# Patient Record
Sex: Female | Born: 1945 | Race: White | Smoking: Never smoker
Health system: Southern US, Community
[De-identification: ages and names within clinical notes are randomized; demographics above are authoritative.]

## PROBLEM LIST (undated history)

## (undated) DIAGNOSIS — T783XXA Angioneurotic edema, initial encounter: Secondary | ICD-10-CM

## (undated) DIAGNOSIS — I4891 Unspecified atrial fibrillation: Secondary | ICD-10-CM

## (undated) DIAGNOSIS — E785 Hyperlipidemia, unspecified: Secondary | ICD-10-CM

## (undated) DIAGNOSIS — Z5320 Procedure and treatment not carried out because of patient's decision for unspecified reasons: Secondary | ICD-10-CM

## (undated) DIAGNOSIS — E042 Nontoxic multinodular goiter: Secondary | ICD-10-CM

## (undated) DIAGNOSIS — G4733 Obstructive sleep apnea (adult) (pediatric): Secondary | ICD-10-CM

## (undated) DIAGNOSIS — R011 Cardiac murmur, unspecified: Secondary | ICD-10-CM

## (undated) DIAGNOSIS — M1990 Unspecified osteoarthritis, unspecified site: Secondary | ICD-10-CM

## (undated) DIAGNOSIS — Z9989 Dependence on other enabling machines and devices: Secondary | ICD-10-CM

## (undated) DIAGNOSIS — E039 Hypothyroidism, unspecified: Secondary | ICD-10-CM

## (undated) DIAGNOSIS — R112 Nausea with vomiting, unspecified: Secondary | ICD-10-CM

## (undated) DIAGNOSIS — M8580 Other specified disorders of bone density and structure, unspecified site: Secondary | ICD-10-CM

## (undated) DIAGNOSIS — Z9889 Other specified postprocedural states: Secondary | ICD-10-CM

## (undated) DIAGNOSIS — J069 Acute upper respiratory infection, unspecified: Secondary | ICD-10-CM

## (undated) HISTORY — DX: Nontoxic multinodular goiter: E04.2

## (undated) HISTORY — DX: Unspecified atrial fibrillation: I48.91

## (undated) HISTORY — DX: Acute upper respiratory infection, unspecified: J06.9

## (undated) HISTORY — DX: Unspecified osteoarthritis, unspecified site: M19.90

## (undated) HISTORY — DX: Hyperlipidemia, unspecified: E78.5

## (undated) HISTORY — DX: Hypothyroidism, unspecified: E03.9

## (undated) HISTORY — DX: Angioneurotic edema, initial encounter: T78.3XXA

## (undated) HISTORY — PX: CHOLECYSTECTOMY: SHX55

## (undated) HISTORY — DX: Cardiac murmur, unspecified: R01.1

## (undated) HISTORY — DX: Morbid (severe) obesity due to excess calories: E66.01

## (undated) HISTORY — DX: Dependence on other enabling machines and devices: Z99.89

## (undated) HISTORY — DX: Other specified disorders of bone density and structure, unspecified site: M85.80

## (undated) HISTORY — DX: Obstructive sleep apnea (adult) (pediatric): G47.33

## (undated) HISTORY — DX: Procedure and treatment not carried out because of patient's decision for unspecified reasons: Z53.20

## (undated) HISTORY — PX: TONSILLECTOMY: SUR1361

---

## 2011-06-27 DIAGNOSIS — I4891 Unspecified atrial fibrillation: Secondary | ICD-10-CM

## 2011-06-27 HISTORY — DX: Unspecified atrial fibrillation: I48.91

## 2011-11-06 DIAGNOSIS — I4891 Unspecified atrial fibrillation: Secondary | ICD-10-CM | POA: Diagnosis not present

## 2011-11-06 DIAGNOSIS — E039 Hypothyroidism, unspecified: Secondary | ICD-10-CM | POA: Diagnosis not present

## 2011-11-06 DIAGNOSIS — G9009 Other idiopathic peripheral autonomic neuropathy: Secondary | ICD-10-CM | POA: Diagnosis not present

## 2011-11-10 DIAGNOSIS — G9009 Other idiopathic peripheral autonomic neuropathy: Secondary | ICD-10-CM | POA: Diagnosis not present

## 2011-11-10 DIAGNOSIS — E039 Hypothyroidism, unspecified: Secondary | ICD-10-CM | POA: Diagnosis not present

## 2011-11-10 DIAGNOSIS — R5381 Other malaise: Secondary | ICD-10-CM | POA: Diagnosis not present

## 2011-11-27 DIAGNOSIS — M6281 Muscle weakness (generalized): Secondary | ICD-10-CM | POA: Diagnosis not present

## 2011-12-01 ENCOUNTER — Other Ambulatory Visit: Payer: Self-pay | Admitting: Endocrinology

## 2011-12-01 DIAGNOSIS — E049 Nontoxic goiter, unspecified: Secondary | ICD-10-CM | POA: Diagnosis not present

## 2011-12-01 DIAGNOSIS — E039 Hypothyroidism, unspecified: Secondary | ICD-10-CM | POA: Diagnosis not present

## 2011-12-03 ENCOUNTER — Ambulatory Visit
Admission: RE | Admit: 2011-12-03 | Discharge: 2011-12-03 | Disposition: A | Discharge status: Home / Self Care | Payer: Medicare Other | Source: Ambulatory Visit | Attending: Endocrinology | Admitting: Endocrinology

## 2011-12-03 DIAGNOSIS — E042 Nontoxic multinodular goiter: Secondary | ICD-10-CM | POA: Diagnosis not present

## 2011-12-03 DIAGNOSIS — R5383 Other fatigue: Secondary | ICD-10-CM | POA: Diagnosis not present

## 2011-12-03 DIAGNOSIS — E049 Nontoxic goiter, unspecified: Secondary | ICD-10-CM

## 2011-12-03 DIAGNOSIS — I4891 Unspecified atrial fibrillation: Secondary | ICD-10-CM | POA: Diagnosis not present

## 2011-12-03 DIAGNOSIS — M159 Polyosteoarthritis, unspecified: Secondary | ICD-10-CM | POA: Diagnosis not present

## 2011-12-11 ENCOUNTER — Other Ambulatory Visit: Payer: Self-pay | Admitting: Endocrinology

## 2011-12-11 DIAGNOSIS — E041 Nontoxic single thyroid nodule: Secondary | ICD-10-CM

## 2011-12-26 DIAGNOSIS — I4891 Unspecified atrial fibrillation: Secondary | ICD-10-CM | POA: Diagnosis not present

## 2011-12-26 DIAGNOSIS — Z7901 Long term (current) use of anticoagulants: Secondary | ICD-10-CM | POA: Diagnosis not present

## 2012-01-14 ENCOUNTER — Ambulatory Visit
Admission: RE | Admit: 2012-01-14 | Discharge: 2012-01-14 | Disposition: A | Discharge status: Home / Self Care | Payer: Medicare Other | Source: Ambulatory Visit | Attending: Endocrinology | Admitting: Endocrinology

## 2012-01-14 ENCOUNTER — Other Ambulatory Visit (HOSPITAL_COMMUNITY)
Admission: RE | Admit: 2012-01-14 | Discharge: 2012-01-14 | Disposition: A | Discharge status: Home / Self Care | Payer: Medicare Other | Source: Ambulatory Visit | Attending: Interventional Radiology | Admitting: Interventional Radiology

## 2012-01-14 DIAGNOSIS — E049 Nontoxic goiter, unspecified: Secondary | ICD-10-CM | POA: Insufficient documentation

## 2012-01-14 DIAGNOSIS — E041 Nontoxic single thyroid nodule: Secondary | ICD-10-CM

## 2012-01-14 DIAGNOSIS — E042 Nontoxic multinodular goiter: Secondary | ICD-10-CM | POA: Diagnosis not present

## 2012-05-25 DIAGNOSIS — I4891 Unspecified atrial fibrillation: Secondary | ICD-10-CM | POA: Diagnosis not present

## 2012-05-25 DIAGNOSIS — R5383 Other fatigue: Secondary | ICD-10-CM | POA: Diagnosis not present

## 2012-05-25 DIAGNOSIS — R5381 Other malaise: Secondary | ICD-10-CM | POA: Diagnosis not present

## 2012-05-25 DIAGNOSIS — E039 Hypothyroidism, unspecified: Secondary | ICD-10-CM | POA: Diagnosis not present

## 2012-05-31 DIAGNOSIS — E040 Nontoxic diffuse goiter: Secondary | ICD-10-CM | POA: Diagnosis not present

## 2012-05-31 DIAGNOSIS — E039 Hypothyroidism, unspecified: Secondary | ICD-10-CM | POA: Diagnosis not present

## 2012-06-02 DIAGNOSIS — I4891 Unspecified atrial fibrillation: Secondary | ICD-10-CM | POA: Diagnosis not present

## 2012-06-02 DIAGNOSIS — M159 Polyosteoarthritis, unspecified: Secondary | ICD-10-CM | POA: Diagnosis not present

## 2012-06-02 DIAGNOSIS — E039 Hypothyroidism, unspecified: Secondary | ICD-10-CM | POA: Diagnosis not present

## 2012-06-29 DIAGNOSIS — Z79899 Other long term (current) drug therapy: Secondary | ICD-10-CM | POA: Diagnosis not present

## 2012-06-29 DIAGNOSIS — E782 Mixed hyperlipidemia: Secondary | ICD-10-CM | POA: Diagnosis not present

## 2012-08-05 DIAGNOSIS — R5381 Other malaise: Secondary | ICD-10-CM | POA: Diagnosis not present

## 2012-08-05 DIAGNOSIS — E782 Mixed hyperlipidemia: Secondary | ICD-10-CM | POA: Diagnosis not present

## 2012-08-06 DIAGNOSIS — G4733 Obstructive sleep apnea (adult) (pediatric): Secondary | ICD-10-CM | POA: Diagnosis not present

## 2012-08-06 DIAGNOSIS — G4730 Sleep apnea, unspecified: Secondary | ICD-10-CM | POA: Diagnosis not present

## 2012-08-11 DIAGNOSIS — Z23 Encounter for immunization: Secondary | ICD-10-CM | POA: Diagnosis not present

## 2012-08-27 DIAGNOSIS — G4730 Sleep apnea, unspecified: Secondary | ICD-10-CM | POA: Diagnosis not present

## 2012-08-27 DIAGNOSIS — G4733 Obstructive sleep apnea (adult) (pediatric): Secondary | ICD-10-CM | POA: Diagnosis not present

## 2012-09-22 DIAGNOSIS — Z124 Encounter for screening for malignant neoplasm of cervix: Secondary | ICD-10-CM | POA: Diagnosis not present

## 2012-09-22 DIAGNOSIS — Z01419 Encounter for gynecological examination (general) (routine) without abnormal findings: Secondary | ICD-10-CM | POA: Diagnosis not present

## 2012-09-22 DIAGNOSIS — Z1211 Encounter for screening for malignant neoplasm of colon: Secondary | ICD-10-CM | POA: Diagnosis not present

## 2012-09-22 DIAGNOSIS — Z1151 Encounter for screening for human papillomavirus (HPV): Secondary | ICD-10-CM | POA: Diagnosis not present

## 2012-09-22 DIAGNOSIS — M949 Disorder of cartilage, unspecified: Secondary | ICD-10-CM | POA: Diagnosis not present

## 2012-09-22 DIAGNOSIS — M899 Disorder of bone, unspecified: Secondary | ICD-10-CM | POA: Diagnosis not present

## 2012-09-30 DIAGNOSIS — H25049 Posterior subcapsular polar age-related cataract, unspecified eye: Secondary | ICD-10-CM | POA: Diagnosis not present

## 2012-09-30 DIAGNOSIS — H524 Presbyopia: Secondary | ICD-10-CM | POA: Diagnosis not present

## 2012-09-30 DIAGNOSIS — H2510 Age-related nuclear cataract, unspecified eye: Secondary | ICD-10-CM | POA: Diagnosis not present

## 2012-10-08 DIAGNOSIS — Z1231 Encounter for screening mammogram for malignant neoplasm of breast: Secondary | ICD-10-CM | POA: Diagnosis not present

## 2012-10-25 DIAGNOSIS — G4733 Obstructive sleep apnea (adult) (pediatric): Secondary | ICD-10-CM

## 2012-10-25 HISTORY — DX: Obstructive sleep apnea (adult) (pediatric): G47.33

## 2012-11-03 DIAGNOSIS — I4891 Unspecified atrial fibrillation: Secondary | ICD-10-CM | POA: Diagnosis not present

## 2012-11-03 DIAGNOSIS — M159 Polyosteoarthritis, unspecified: Secondary | ICD-10-CM | POA: Diagnosis not present

## 2012-11-03 DIAGNOSIS — Z1212 Encounter for screening for malignant neoplasm of rectum: Secondary | ICD-10-CM | POA: Diagnosis not present

## 2012-11-03 DIAGNOSIS — Z1331 Encounter for screening for depression: Secondary | ICD-10-CM | POA: Diagnosis not present

## 2012-11-03 DIAGNOSIS — Z0000 Encounter for general adult medical examination without abnormal findings: Secondary | ICD-10-CM | POA: Diagnosis not present

## 2012-11-03 DIAGNOSIS — N390 Urinary tract infection, site not specified: Secondary | ICD-10-CM | POA: Diagnosis not present

## 2012-11-03 DIAGNOSIS — E039 Hypothyroidism, unspecified: Secondary | ICD-10-CM | POA: Diagnosis not present

## 2012-11-09 DIAGNOSIS — E040 Nontoxic diffuse goiter: Secondary | ICD-10-CM | POA: Diagnosis not present

## 2012-11-09 DIAGNOSIS — E039 Hypothyroidism, unspecified: Secondary | ICD-10-CM | POA: Diagnosis not present

## 2012-11-09 DIAGNOSIS — N951 Menopausal and female climacteric states: Secondary | ICD-10-CM | POA: Diagnosis not present

## 2012-11-09 DIAGNOSIS — Z0000 Encounter for general adult medical examination without abnormal findings: Secondary | ICD-10-CM | POA: Diagnosis not present

## 2012-11-09 DIAGNOSIS — I4891 Unspecified atrial fibrillation: Secondary | ICD-10-CM | POA: Diagnosis not present

## 2012-11-09 DIAGNOSIS — M159 Polyosteoarthritis, unspecified: Secondary | ICD-10-CM | POA: Diagnosis not present

## 2012-12-09 DIAGNOSIS — G4733 Obstructive sleep apnea (adult) (pediatric): Secondary | ICD-10-CM | POA: Diagnosis not present

## 2013-01-31 DIAGNOSIS — I4891 Unspecified atrial fibrillation: Secondary | ICD-10-CM | POA: Diagnosis not present

## 2013-01-31 DIAGNOSIS — R002 Palpitations: Secondary | ICD-10-CM | POA: Diagnosis not present

## 2013-01-31 DIAGNOSIS — R000 Tachycardia, unspecified: Secondary | ICD-10-CM | POA: Diagnosis not present

## 2013-01-31 DIAGNOSIS — R5381 Other malaise: Secondary | ICD-10-CM | POA: Diagnosis not present

## 2013-01-31 DIAGNOSIS — I472 Ventricular tachycardia: Secondary | ICD-10-CM | POA: Diagnosis not present

## 2013-01-31 DIAGNOSIS — E039 Hypothyroidism, unspecified: Secondary | ICD-10-CM | POA: Diagnosis not present

## 2013-02-19 ENCOUNTER — Encounter: Payer: Self-pay | Admitting: Pharmacist Clinician (PhC)/ Clinical Pharmacy Specialist

## 2013-02-21 DIAGNOSIS — L923 Foreign body granuloma of the skin and subcutaneous tissue: Secondary | ICD-10-CM | POA: Diagnosis not present

## 2013-02-22 ENCOUNTER — Encounter: Payer: Self-pay | Admitting: Cardiovascular Disease

## 2013-02-25 DIAGNOSIS — I4891 Unspecified atrial fibrillation: Secondary | ICD-10-CM | POA: Diagnosis not present

## 2013-02-25 DIAGNOSIS — R002 Palpitations: Secondary | ICD-10-CM | POA: Diagnosis not present

## 2013-02-25 DIAGNOSIS — E782 Mixed hyperlipidemia: Secondary | ICD-10-CM | POA: Diagnosis not present

## 2013-04-06 ENCOUNTER — Telehealth: Payer: Self-pay | Admitting: Cardiovascular Disease

## 2013-04-06 MED ORDER — BISOPROLOL FUMARATE 5 MG PO TABS: 5.0000 mg | ORAL_TABLET | ORAL | Status: DC

## 2013-04-06 NOTE — Telephone Encounter (Signed)
[facility]

## 2013-04-06 NOTE — Telephone Encounter (Signed)
Pharmacist told her to call her for a new prescription for her Bisoprolol Fumarate 5mg  #135 and refills-Call to [facility]!

## 2013-04-27 ENCOUNTER — Telehealth: Payer: Self-pay | Admitting: *Deleted

## 2013-04-27 NOTE — Telephone Encounter (Signed)
Faxed back CPAP supply order back.

## 2013-05-31 ENCOUNTER — Other Ambulatory Visit: Payer: Self-pay | Admitting: Endocrinology

## 2013-05-31 DIAGNOSIS — E040 Nontoxic diffuse goiter: Secondary | ICD-10-CM | POA: Diagnosis not present

## 2013-05-31 DIAGNOSIS — E049 Nontoxic goiter, unspecified: Secondary | ICD-10-CM

## 2013-05-31 DIAGNOSIS — E039 Hypothyroidism, unspecified: Secondary | ICD-10-CM | POA: Diagnosis not present

## 2013-06-03 ENCOUNTER — Ambulatory Visit
Admission: RE | Admit: 2013-06-03 | Discharge: 2013-06-03 | Disposition: A | Discharge status: Home / Self Care | Payer: Medicare Other | Source: Ambulatory Visit | Attending: Endocrinology | Admitting: Endocrinology

## 2013-06-03 DIAGNOSIS — E042 Nontoxic multinodular goiter: Secondary | ICD-10-CM | POA: Diagnosis not present

## 2013-06-03 DIAGNOSIS — E049 Nontoxic goiter, unspecified: Secondary | ICD-10-CM

## 2013-08-05 DIAGNOSIS — Z23 Encounter for immunization: Secondary | ICD-10-CM | POA: Diagnosis not present

## 2013-10-06 ENCOUNTER — Telehealth: Payer: Self-pay | Admitting: *Deleted

## 2013-10-06 NOTE — Telephone Encounter (Signed)
Faxed back CPAP order supply back to [facility].

## 2013-10-21 ENCOUNTER — Emergency Department (INDEPENDENT_AMBULATORY_CARE_PROVIDER_SITE_OTHER): Payer: Medicare Other

## 2013-10-21 ENCOUNTER — Emergency Department (INDEPENDENT_AMBULATORY_CARE_PROVIDER_SITE_OTHER)
Admission: EM | Admit: 2013-10-21 | Discharge: 2013-10-21 | Disposition: A | Discharge status: Home / Self Care | Payer: Medicare Other | Source: Home / Self Care | Attending: Family Medicine | Admitting: Family Medicine

## 2013-10-21 ENCOUNTER — Encounter (HOSPITAL_COMMUNITY): Payer: Self-pay | Admitting: Emergency Medicine

## 2013-10-21 DIAGNOSIS — S93409A Sprain of unspecified ligament of unspecified ankle, initial encounter: Secondary | ICD-10-CM

## 2013-10-21 DIAGNOSIS — S62639A Displaced fracture of distal phalanx of unspecified finger, initial encounter for closed fracture: Secondary | ICD-10-CM | POA: Diagnosis not present

## 2013-10-21 DIAGNOSIS — S62609A Fracture of unspecified phalanx of unspecified finger, initial encounter for closed fracture: Secondary | ICD-10-CM | POA: Diagnosis not present

## 2013-10-21 DIAGNOSIS — S99919A Unspecified injury of unspecified ankle, initial encounter: Secondary | ICD-10-CM | POA: Diagnosis not present

## 2013-10-21 DIAGNOSIS — W010XXA Fall on same level from slipping, tripping and stumbling without subsequent striking against object, initial encounter: Secondary | ICD-10-CM | POA: Diagnosis not present

## 2013-10-21 DIAGNOSIS — S8990XA Unspecified injury of unspecified lower leg, initial encounter: Secondary | ICD-10-CM | POA: Diagnosis not present

## 2013-10-21 DIAGNOSIS — M7989 Other specified soft tissue disorders: Secondary | ICD-10-CM | POA: Diagnosis not present

## 2013-10-21 DIAGNOSIS — M25579 Pain in unspecified ankle and joints of unspecified foot: Secondary | ICD-10-CM | POA: Diagnosis not present

## 2013-10-21 DIAGNOSIS — S93402A Sprain of unspecified ligament of left ankle, initial encounter: Secondary | ICD-10-CM

## 2013-10-21 MED ORDER — NAPROXEN 500 MG PO TABS: 500.0000 mg | ORAL_TABLET | Freq: Two times a day (BID) | ORAL | Status: DC

## 2013-10-21 MED ORDER — TRAMADOL HCL 50 MG PO TABS: 50.0000 mg | ORAL_TABLET | Freq: Four times a day (QID) | ORAL | Status: DC | PRN

## 2013-10-21 NOTE — ED Provider Notes (Signed)
CSN: [id number]     Arrival date & time 10/21/13  1120 History   First MD Initiated Contact with Patient 10/21/13 1314     Chief Complaint  Patient presents with  . Ankle Injury  . Hand Injury   (Consider location/radiation/quality/duration/timing/severity/associated sxs/prior Treatment) HPI Comments: 68 year old female presents complaining of left ankle and left ear injury. Yesterday, she was walking down her steps when she slipped on a patch of Frost or ice. She landed and had severe immediate pain in the ankle and little finger. For the rest of yesterday, she was unable to bear weight on that foot. Today, she is able to bear weight but she still rates the pain as very severe. She is also having pain in her left little finger, distal portion. She has increased pain with any palpation or any bending of the finger. She denies any other injury.  Patient is a 68 y.o. female presenting with lower extremity injury and hand injury.  Ankle Injury Pertinent negatives include no chest pain, no abdominal pain and no shortness of breath.  Hand Injury Associated symptoms: no fever     Past Medical History  Diagnosis Date  . Atrial fibrillation 06/27/2011    Echo - EF 55-60%, LV wall thickness mildly increased; mod/severe LA dilation; estimated pulm arterial systolic pressure elevated at [id number]  . OSA on CPAP 10/25/2012    ahi 1.5  . Hypothyroid   . Hyperlipidemia    Past Surgical History  Procedure Laterality Date  . Tonsillectomy    . Cholecystectomy     Family History  Problem Relation Age of Onset  . Atrial fibrillation Mother   . Atrial fibrillation Brother    History  Substance Use Topics  . Smoking status: Never Smoker   . Smokeless tobacco: Not on file  . Alcohol Use: No   OB History   Grav Para Term Preterm Abortions TAB SAB Ect Mult Living                 Review of Systems  Constitutional: Negative for fever and chills.  Eyes: Negative for visual disturbance.   Respiratory: Negative for cough and shortness of breath.   Cardiovascular: Negative for chest pain, palpitations and leg swelling.  Gastrointestinal: Negative for nausea, vomiting and abdominal pain.  Endocrine: Negative for polydipsia and polyuria.  Genitourinary: Negative for dysuria, urgency and frequency.  Musculoskeletal:       See history of present illness  Skin: Negative for rash.  Neurological: Negative for dizziness, weakness and light-headedness.    Allergies  Lidocaine  Home Medications   Current Outpatient Rx  Name  Route  Sig  Dispense  Refill  . aspirin EC 81 MG tablet   Oral   Take 81 mg by mouth daily.         Marland Kitchen. levothyroxine (SYNTHROID, LEVOTHROID) 100 MCG tablet   Oral   Take 100 mcg by mouth daily before breakfast.         . bisoprolol (ZEBETA) 5 MG tablet   Oral   Take 1 tablet (5 mg total) by mouth as directed. Take 2.5mg  each am and 5mg  each pm   135 tablet   3   . naproxen (NAPROSYN) 500 MG tablet   Oral   Take 1 tablet (500 mg total) by mouth 2 (two) times daily.   30 tablet   0   . traMADol (ULTRAM) 50 MG tablet   Oral   Take 1 tablet (50 mg total) by mouth  every 6 (six) hours as needed.   20 tablet   0    BP 164/75  Pulse 60  Temp(Src) 98.4 F (36.9 C) (Oral)  Resp 18  SpO2 100% Physical Exam  Nursing note and vitals reviewed. Constitutional: She is oriented to person, place, and time. Vital signs are normal. She appears well-developed and well-nourished. No distress.  HENT:  Head: Normocephalic and atraumatic.  Pulmonary/Chest: Effort normal. No respiratory distress.  Musculoskeletal:       Left ankle: She exhibits decreased range of motion, swelling and ecchymosis (Minimal). She exhibits normal pulse. Tenderness. AITFL tenderness found. Achilles tendon normal.       Left hand: She exhibits decreased range of motion (Decreased flexion of the DIP, with pain) and tenderness (over DIP). She exhibits normal capillary refill.  Normal sensation noted. Normal strength noted.  Neurological: She is alert and oriented to person, place, and time. She has normal strength. Coordination normal.  Skin: Skin is warm and dry. No rash noted. She is not diaphoretic.  Psychiatric: She has a normal mood and affect. Judgment normal.    ED Course  Procedures (including critical care time) Labs Review Labs Reviewed - No data to display Imaging Review Dg Ankle Complete Left  10/21/2013   CLINICAL DATA:  Trauma and pain.  EXAM: LEFT ANKLE COMPLETE - 3+ VIEW  COMPARISON:  None.  FINDINGS: Diffuse soft tissue swelling. Tibiotalar osteoarthritis. Small Achilles and calcaneal spurs. Osseous irregularity about the medial tibia is favored to be degenerative.  IMPRESSION: Soft tissue swelling and degenerative change. No acute osseous abnormality.   Electronically Signed   By: Jeronimo Greaves M.D.   On: 10/21/2013 13:51   Dg Finger Little Left  10/21/2013   CLINICAL DATA:  Fall, left pain and 5th finger, injury, pain at distal interphalangeal joint.  EXAM: LEFT LITTLE FINGER 2+V  COMPARISON:  None available for comparison at time of study interpretation.  FINDINGS: Nondisplaced oblique fracture through the dorsal aspect of the base of the 5th distal phalanx with intra-articular extension. No dislocation.  No destructive bony lesions. Mild distal interphalangeal joint space narrowing, with marginal spurring consistent with early osteoarthrosis. No destructive bony lesions. Soft tissue planes are nonsuspicious.  IMPRESSION: Nondisplaced intra-articular base of 5th distal phalanx fracture without dislocation.   Electronically Signed   By: Awilda Metro   On: 10/21/2013 13:41      MDM   1. Ankle sprain, left, initial encounter   2. Finger fracture, left, closed, initial encounter    Finger fracture, nondisplaced.  Buddy tape, followup with PCP. Ankle x-ray is normal. Will use an Aircast and she has a cane to use.  follow up with primary care as  well for this problem   Meds ordered this encounter  Medications  . naproxen (NAPROSYN) 500 MG tablet    Sig: Take 1 tablet (500 mg total) by mouth 2 (two) times daily.    Dispense:  30 tablet    Refill:  0    Order Specific Question:  Supervising Provider    Answer:  Linna Hoff [phone]  . traMADol (ULTRAM) 50 MG tablet    Sig: Take 1 tablet (50 mg total) by mouth every 6 (six) hours as needed.    Dispense:  20 tablet    Refill:  0    Order Specific Question:  Supervising Provider    Answer:  Bradd Canary D [[id number]]       Graylon Good, PA-C 10/21/13 1409

## 2013-10-21 NOTE — ED Notes (Signed)
Waiting discharge papers and splints. -

## 2013-10-21 NOTE — ED Provider Notes (Signed)
Medical screening examination/treatment/procedure(s) were performed by resident physician or non-physician practitioner and as supervising physician I was immediately available for consultation/collaboration.   KINDL,JAMES DOUGLAS MD.   James D Kindl, MD 10/21/13 1859 

## 2013-10-21 NOTE — ED Notes (Signed)
Reports slipping and falling down steps injuring left ankle and left pinky finger.  Mild swelling.  Pain with walking. Limited rom with pinky finger.  Pt has used aspirin and epsom salt soak with mild relief in pain.

## 2013-10-21 NOTE — Discharge Instructions (Signed)
Ankle Sprain °An ankle sprain is an injury to the strong, fibrous tissues (ligaments) that hold the bones of your ankle joint together.  °CAUSES °An ankle sprain is usually caused by a fall or by twisting your ankle. Ankle sprains most commonly occur when you step on the outer edge of your foot, and your ankle turns inward. People who participate in sports are more prone to these types of injuries.  °SYMPTOMS  °· Pain in your ankle. The pain may be present at rest or only when you are trying to stand or walk. °· Swelling. °· Bruising. Bruising may develop immediately or within 1 to 2 days after your injury. °· Difficulty standing or walking, particularly when turning corners or changing directions. °DIAGNOSIS  °Your caregiver will ask you details about your injury and perform a physical exam of your ankle to determine if you have an ankle sprain. During the physical exam, your caregiver will press on and apply pressure to specific areas of your foot and ankle. Your caregiver will try to move your ankle in certain ways. An X-ray exam may be done to be sure a bone was not broken or a ligament did not separate from one of the bones in your ankle (avulsion fracture).  °TREATMENT  °Certain types of braces can help stabilize your ankle. Your caregiver can make a recommendation for this. Your caregiver may recommend the use of medicine for pain. If your sprain is severe, your caregiver may refer you to a surgeon who helps to restore function to parts of your skeletal system (orthopedist) or a physical therapist. °HOME CARE INSTRUCTIONS  °· Apply ice to your injury for 1 2 days or as directed by your caregiver. Applying ice helps to reduce inflammation and pain. °· Put ice in a plastic bag. °· Place a towel between your skin and the bag. °· Leave the ice on for 15-20 minutes at a time, every 2 hours while you are awake. °· Only take over-the-counter or prescription medicines for pain, discomfort, or fever as directed by  your caregiver. °· Elevate your injured ankle above the level of your heart as much as possible for 2 3 days. °· If your caregiver recommends crutches, use them as instructed. Gradually put weight on the affected ankle. Continue to use crutches or a cane until you can walk without feeling pain in your ankle. °· If you have a plaster splint, wear the splint as directed by your caregiver. Do not rest it on anything harder than a pillow for the first 24 hours. Do not put weight on it. Do not get it wet. You may take it off to take a shower or bath. °· You may have been given an elastic bandage to wear around your ankle to provide support. If the elastic bandage is too tight (you have numbness or tingling in your foot or your foot becomes cold and blue), adjust the bandage to make it comfortable. °· If you have an air splint, you may blow more air into it or let air out to make it more comfortable. You may take your splint off at night and before taking a shower or bath. Wiggle your toes in the splint several times per day to decrease swelling. °SEEK MEDICAL CARE IF:  °· You have rapidly increasing bruising or swelling. °· Your toes feel extremely cold or you lose feeling in your foot. °· Your pain is not relieved with medicine. °SEEK IMMEDIATE MEDICAL CARE IF: °· Your toes are numb   or blue.  You have severe pain that is increasing. MAKE SURE YOU:   Understand these instructions.  Will watch your condition.  Will get help right away if you are not doing well or get worse. Document Released: 10/06/2005 Document Revised: 06/30/2012 Document Reviewed: 10/18/2011 [facility] Patient Information 2014 [place], [place].  Finger Fracture Fractures of fingers are breaks in the bones of the fingers. There are many types of fractures. There are different ways of treating these fractures, all of which can be correct. Your caregiver will discuss the best way to treat your fracture. TREATMENT  Finger fractures can be  treated with:   Non-reduction - this means the bones are in place. The finger is splinted without changing the positions of the bone pieces. The splint is usually left on for about a week to ten days. This will depend on your fracture and what your caregiver thinks.  Closed reduction - the bones are put back into position without using surgery. The finger is then splinted.  ORIF (open reduction and internal fixation) - the fracture site is opened. Then the bone pieces are fixed into place with pins or some type of hardware. This is seldom required. It depends on the severity of the fracture. Your caregiver will discuss the type of fracture you have and the treatment that will be best for that problem. If surgery is the treatment of choice, the following is information for you to know and also let your caregiver know about prior to surgery. LET YOUR CAREGIVER KNOW ABOUT:  Allergies  Medications taken including herbs, eye drops, over the counter medications, and creams  Use of steroids (by mouth or creams)  Previous problems with anesthetics or Novocaine  Possibility of pregnancy, if this applies  History of blood clots (thrombophlebitis)  History of bleeding or blood problems  Previous surgery  Other health problems AFTER THE PROCEDURE After surgery, you will be taken to the recovery area where a nurse will check your progress. Once you're awake, stable, and taking fluids well, barring other problems you will be allowed to go home. Once home an ice pack applied to your operative site may help with discomfort and keep the swelling down. HOME CARE INSTRUCTIONS   Follow your caregiver's instructions as to activities, exercises, physical therapy, and driving a car.  Use your finger and exercise as directed.  Only take over-the-counter or prescription medicines for pain, discomfort, or fever as directed by your caregiver. Do not take aspirin until your caregiver OK's it, as this can  increase bleeding immediately following surgery.  Stop using ibuprofen if it upsets your stomach. Let your caregiver know about it. SEEK MEDICAL CARE IF:  You have increased bleeding (more than a small spot) from the wound or from beneath your splint.  You develop redness, swelling, or increasing pain in the wound or from beneath your splint.  There is pus coming from the wound or from beneath your splint.  An unexplained oral temperature above 102 F (38.9 C) develops, or as your caregiver suggests.  There is a foul smell coming from the wound or dressing or from beneath your splint. SEEK IMMEDIATE MEDICAL CARE IF:   You develop a rash.  You have difficulty breathing.  You have any allergic problems. MAKE SURE YOU:   Understand these instructions.  Will watch your condition.  Will get help right away if you are not doing well or get worse. Document Released: 01/18/2001 Document Revised: 12/29/2011 Document Reviewed: 05/25/2008 Exit[employer] Patient Information  2014 [employer]. ° °

## 2013-10-27 DIAGNOSIS — Z124 Encounter for screening for malignant neoplasm of cervix: Secondary | ICD-10-CM | POA: Diagnosis not present

## 2013-10-27 DIAGNOSIS — B359 Dermatophytosis, unspecified: Secondary | ICD-10-CM | POA: Diagnosis not present

## 2013-11-08 DIAGNOSIS — I4891 Unspecified atrial fibrillation: Secondary | ICD-10-CM | POA: Diagnosis not present

## 2013-11-08 DIAGNOSIS — R5381 Other malaise: Secondary | ICD-10-CM | POA: Diagnosis not present

## 2013-11-08 DIAGNOSIS — M159 Polyosteoarthritis, unspecified: Secondary | ICD-10-CM | POA: Diagnosis not present

## 2013-11-08 DIAGNOSIS — E039 Hypothyroidism, unspecified: Secondary | ICD-10-CM | POA: Diagnosis not present

## 2013-11-08 DIAGNOSIS — R5383 Other fatigue: Secondary | ICD-10-CM | POA: Diagnosis not present

## 2013-11-15 DIAGNOSIS — E039 Hypothyroidism, unspecified: Secondary | ICD-10-CM | POA: Diagnosis not present

## 2013-11-15 DIAGNOSIS — K2100 Gastro-esophageal reflux disease with esophagitis, without bleeding: Secondary | ICD-10-CM | POA: Diagnosis not present

## 2013-11-15 DIAGNOSIS — Z23 Encounter for immunization: Secondary | ICD-10-CM | POA: Diagnosis not present

## 2013-11-15 DIAGNOSIS — S62639A Displaced fracture of distal phalanx of unspecified finger, initial encounter for closed fracture: Secondary | ICD-10-CM | POA: Diagnosis not present

## 2013-11-15 DIAGNOSIS — M79609 Pain in unspecified limb: Secondary | ICD-10-CM | POA: Diagnosis not present

## 2013-11-15 DIAGNOSIS — S62609A Fracture of unspecified phalanx of unspecified finger, initial encounter for closed fracture: Secondary | ICD-10-CM | POA: Diagnosis not present

## 2013-11-15 DIAGNOSIS — M159 Polyosteoarthritis, unspecified: Secondary | ICD-10-CM | POA: Diagnosis not present

## 2013-11-15 DIAGNOSIS — I4891 Unspecified atrial fibrillation: Secondary | ICD-10-CM | POA: Diagnosis not present

## 2013-11-17 ENCOUNTER — Telehealth: Payer: Self-pay | Admitting: *Deleted

## 2013-11-17 NOTE — Telephone Encounter (Signed)
Faxed CPAP supply order. 

## 2013-11-18 DIAGNOSIS — Z1231 Encounter for screening mammogram for malignant neoplasm of breast: Secondary | ICD-10-CM | POA: Diagnosis not present

## 2013-12-18 IMAGING — US US THYROID BIOPSY
1 series · 11 of 11 positions shown · non-contrast
Comparison: none

CLINICAL DATA: Left thyroid nodules.

ULTRASOUND-GUIDED THYROID ASPIRATION BIOPSY
TECHNIQUE: Survey ultrasound was performed and the dominant lesion
in the inferior pole left lobe was localized.  An appropriate skin
entry site was determined.  Skin was marked, then prepped with
Betadine, draped in usual sterile fashion, and infiltrated locally
with 1% lidocaine.  Under real-time ultrasound guidance, 4  passes
were made into the lesion with 25 gauge needles.  The patient
tolerated procedure well, with no immediate complications.
IMPRESSION
1.  Technically successful ultrasound-guided thyroid aspiration
biopsy

[Series 1: us thyroid biopsy · 0.08mm/px · 11 acquisitions, 11 frames shown]
[im 1/11]
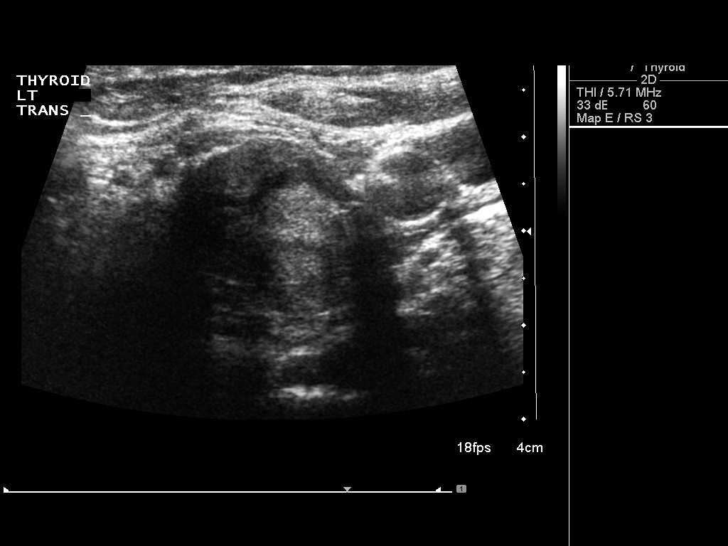
[im 2/11]
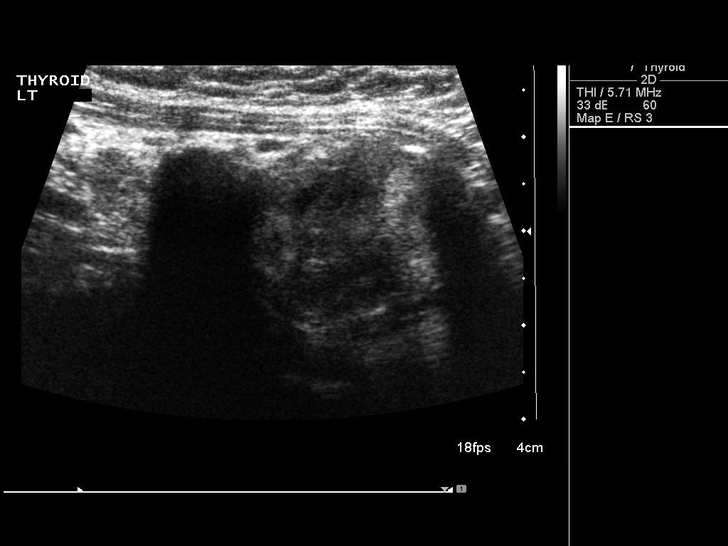
[im 3/11]
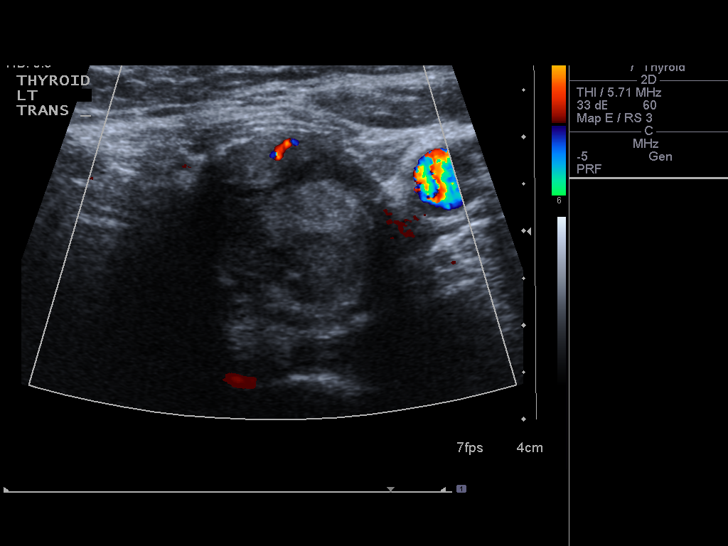
[im 4/11]
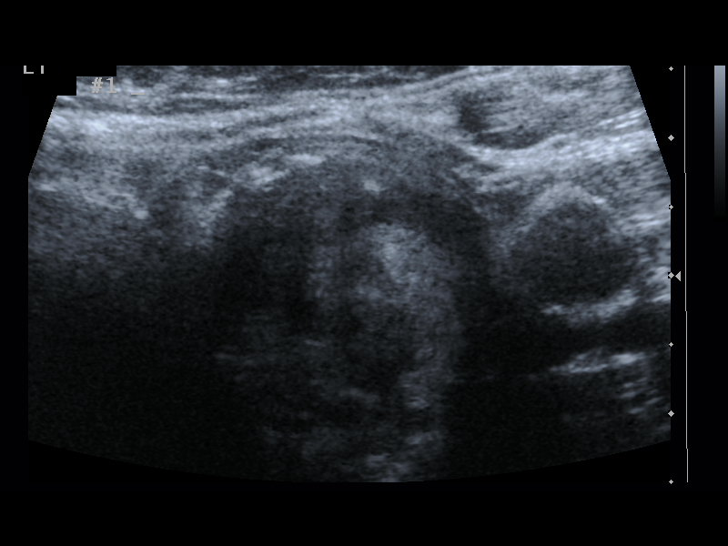
[im 5/11]
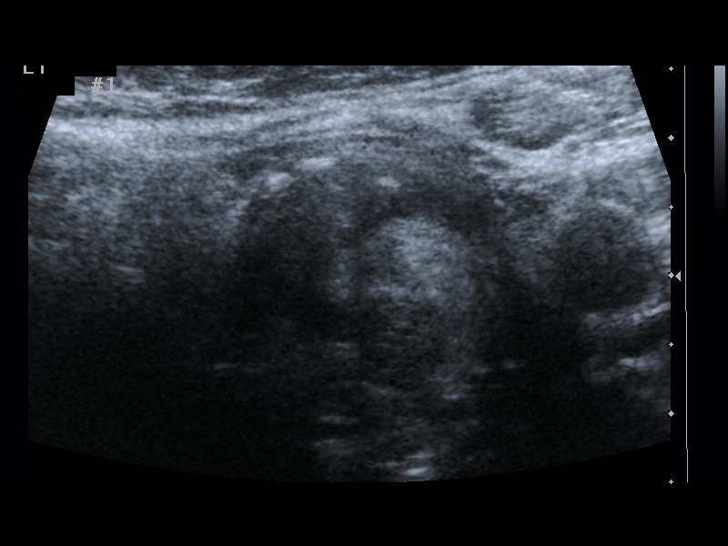
[im 6/11]
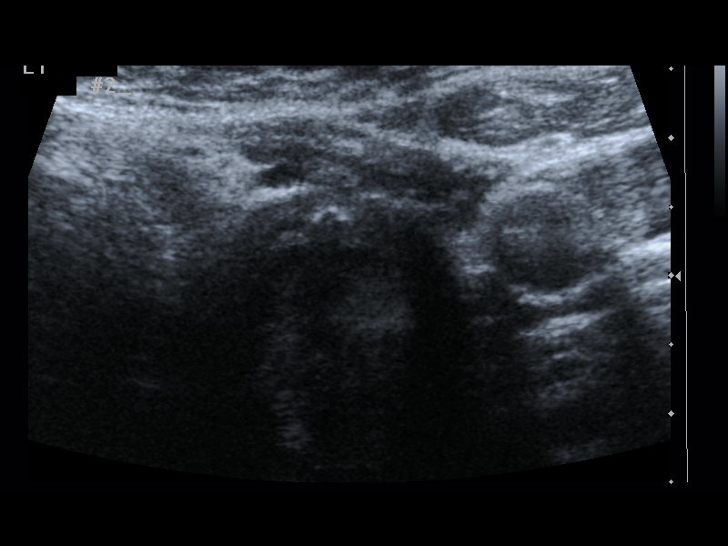
[im 7/11]
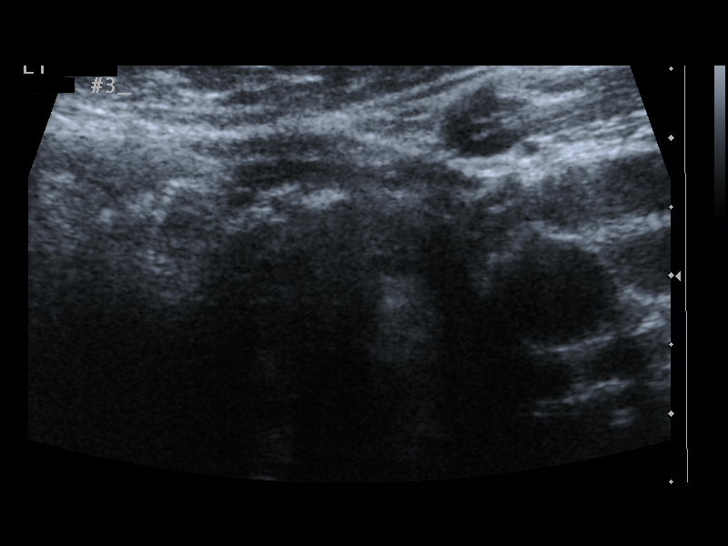
[im 8/11]
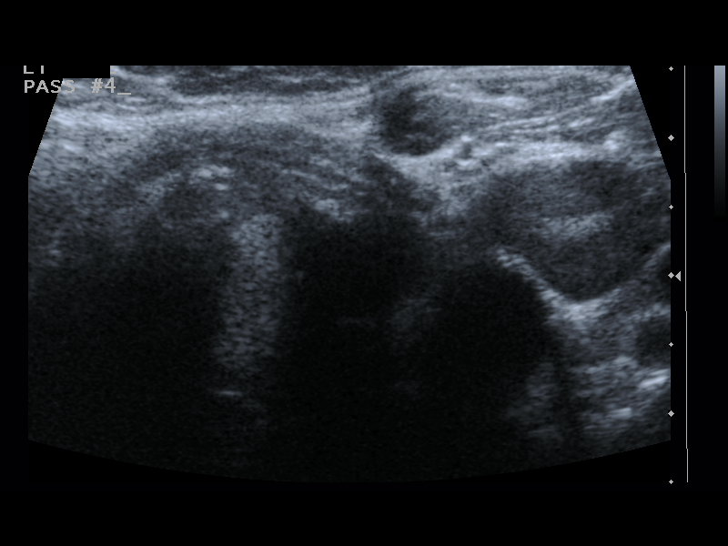
[im 9/11]
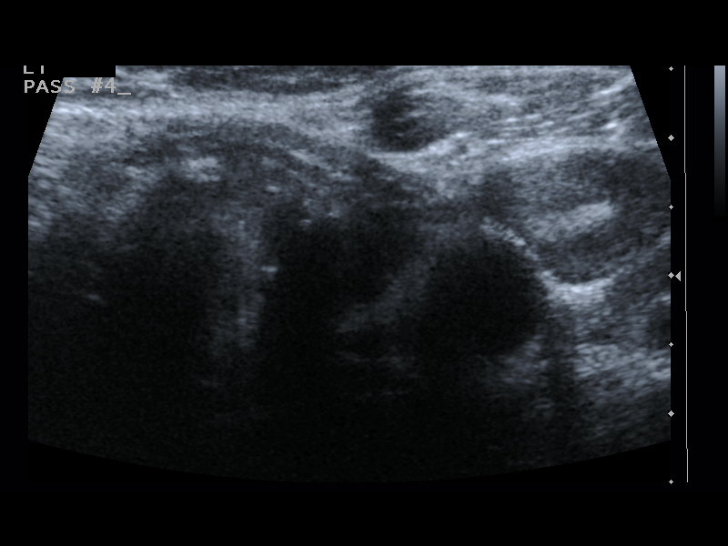
[im 10/11]
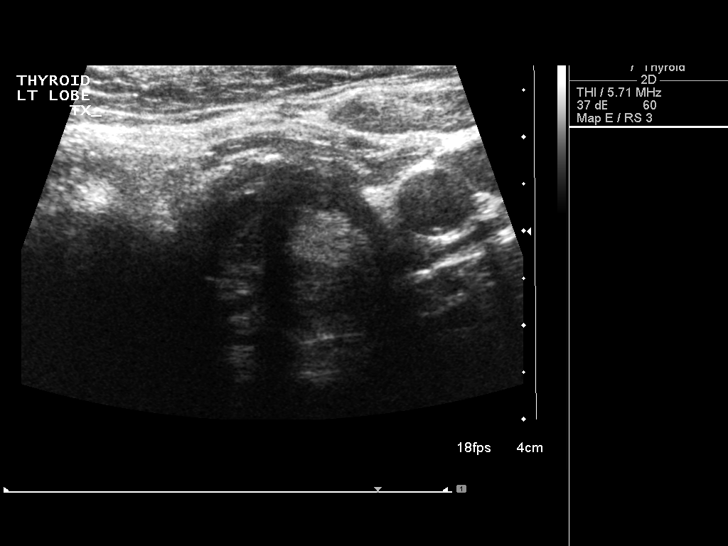
[im 11/11]
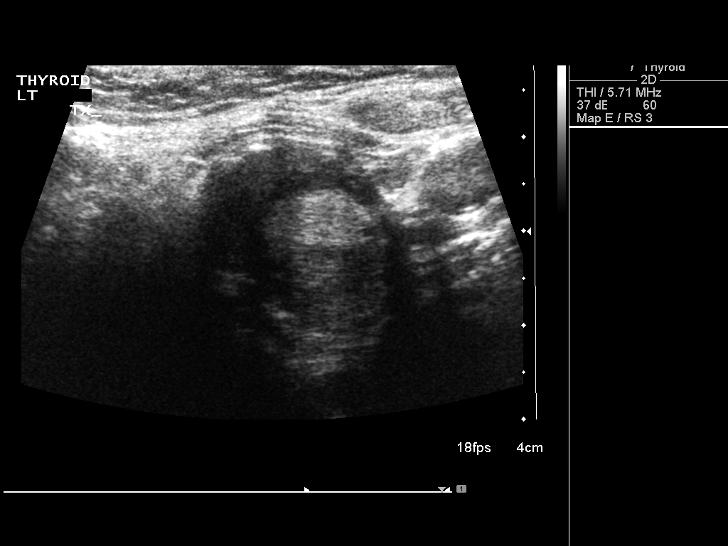

[11 of 11 positions shown; findings below may reference images not displayed]

## 2013-12-23 DIAGNOSIS — S62609A Fracture of unspecified phalanx of unspecified finger, initial encounter for closed fracture: Secondary | ICD-10-CM | POA: Diagnosis not present

## 2013-12-27 DIAGNOSIS — S62609A Fracture of unspecified phalanx of unspecified finger, initial encounter for closed fracture: Secondary | ICD-10-CM | POA: Diagnosis not present

## 2014-01-17 DIAGNOSIS — M25539 Pain in unspecified wrist: Secondary | ICD-10-CM | POA: Diagnosis not present

## 2014-01-23 DIAGNOSIS — S62609A Fracture of unspecified phalanx of unspecified finger, initial encounter for closed fracture: Secondary | ICD-10-CM | POA: Diagnosis not present

## 2014-01-23 DIAGNOSIS — M654 Radial styloid tenosynovitis [de Quervain]: Secondary | ICD-10-CM | POA: Diagnosis not present

## 2014-01-24 DIAGNOSIS — S62609A Fracture of unspecified phalanx of unspecified finger, initial encounter for closed fracture: Secondary | ICD-10-CM | POA: Diagnosis not present

## 2014-02-07 DIAGNOSIS — S62609A Fracture of unspecified phalanx of unspecified finger, initial encounter for closed fracture: Secondary | ICD-10-CM | POA: Diagnosis not present

## 2014-02-22 DIAGNOSIS — S62609A Fracture of unspecified phalanx of unspecified finger, initial encounter for closed fracture: Secondary | ICD-10-CM | POA: Diagnosis not present

## 2014-02-23 DIAGNOSIS — S5290XD Unspecified fracture of unspecified forearm, subsequent encounter for closed fracture with routine healing: Secondary | ICD-10-CM | POA: Diagnosis not present

## 2014-02-23 DIAGNOSIS — M654 Radial styloid tenosynovitis [de Quervain]: Secondary | ICD-10-CM | POA: Diagnosis not present

## 2014-02-28 ENCOUNTER — Encounter: Payer: Self-pay | Admitting: Cardiovascular Disease

## 2014-02-28 ENCOUNTER — Ambulatory Visit (INDEPENDENT_AMBULATORY_CARE_PROVIDER_SITE_OTHER): Payer: Medicare Other | Admitting: Cardiovascular Disease

## 2014-02-28 VITALS — BP 148/76 | HR 56 | Ht 62.0 in | Wt 237.5 lb

## 2014-02-28 DIAGNOSIS — E785 Hyperlipidemia, unspecified: Secondary | ICD-10-CM | POA: Insufficient documentation

## 2014-02-28 DIAGNOSIS — I4891 Unspecified atrial fibrillation: Secondary | ICD-10-CM | POA: Diagnosis not present

## 2014-02-28 DIAGNOSIS — G4733 Obstructive sleep apnea (adult) (pediatric): Secondary | ICD-10-CM

## 2014-02-28 DIAGNOSIS — I10 Essential (primary) hypertension: Secondary | ICD-10-CM | POA: Diagnosis not present

## 2014-02-28 DIAGNOSIS — I480 Paroxysmal atrial fibrillation: Secondary | ICD-10-CM | POA: Insufficient documentation

## 2014-02-28 MED ORDER — BISOPROLOL FUMARATE 5 MG PO TABS: 5.0000 mg | ORAL_TABLET | ORAL | Status: DC

## 2014-02-28 NOTE — Assessment & Plan Note (Signed)
Maintaining sinus rhythm. She gets a occasional brief episodes of fluttering which are rare.

## 2014-02-28 NOTE — Progress Notes (Signed)
     02/28/2014 Shannon Griffin   01-04-1946  [id number]  Primary Physician Georgianne FickAMACHANDRAN,AJITH, MD Primary Cardiologist: Runell GessJonathan J. Maziah Smola MD Roseanne RenoFACP,FACC,FAHA, FSCAI   HPI:  Shannon Griffin  is a 68 year old moderately overweight widowed Caucasian female mother of one daughter, grandmother and one grandchild who I last saw one year ago. [place]. Her history is remarkable for paroxysmal atrial fibrillation and mild hyperlipidemia as well as obesity and obstructive sleep apnea. She denies chest pain or shortness of breath. She had a 2-D echocardiogram performed in the past that showed normal LV size and function with moderate to severe left atrial enlargement.   Current Outpatient Prescriptions  Medication Sig Dispense Refill  . aspirin EC 81 MG tablet Take 162 mg by mouth daily.       . bisoprolol (ZEBETA) 5 MG tablet Take 1 tablet (5 mg total) by mouth as directed. Take 2.5mg  each am and 5mg  each pm  135 tablet  3  . levothyroxine (SYNTHROID, LEVOTHROID) 100 MCG tablet Take 100 mcg by mouth daily before breakfast.       No current facility-administered medications for this visit.    Allergies  Allergen Reactions  . Lidocaine Swelling    History   Social History  . Marital Status: Widowed    Spouse Name: N/A    Number of Children: N/A  . Years of Education: N/A   Occupational History  . Not on file.   Social History Main Topics  . Smoking status: Never Smoker   . Smokeless tobacco: Not on file  . Alcohol Use: No  . Drug Use: No  . Sexual Activity: No   Other Topics Concern  . Not on file   Social History Narrative  . No narrative on file     Review of Systems: General: negative for chills, fever, night sweats or weight changes.  Cardiovascular: negative for chest pain, dyspnea on exertion, edema, orthopnea, palpitations, paroxysmal nocturnal dyspnea or shortness of breath Dermatological: negative for rash Respiratory:  negative for cough or wheezing Urologic: negative for hematuria Abdominal: negative for nausea, vomiting, diarrhea, bright red blood per rectum, melena, or hematemesis Neurologic: negative for visual changes, syncope, or dizziness All other systems reviewed and are otherwise negative except as noted above.    Blood pressure 148/76, pulse 56, height 5\' 2"  (1.575 m), weight 237 lb 8 oz (107.729 kg).  General appearance: alert and no distress Neck: no adenopathy, no carotid bruit, no JVD, supple, symmetrical, trachea midline and thyroid not enlarged, symmetric, no tenderness/mass/nodules Lungs: clear to auscultation bilaterally Heart: regular rate and rhythm, S1, S2 normal, no murmur, click, rub or gallop Extremities: extremities normal, atraumatic, no cyanosis or edema  EKG sinus bradycardia at 56 with no ST or T wave changes  ASSESSMENT AND PLAN:   Hyperlipidemia Not on statin therapy at patient's request  Obstructive sleep apnea On CPAP  Paroxysmal atrial fibrillation Maintaining sinus rhythm. She gets a occasional brief episodes of fluttering which are rare.      Runell GessJonathan J. Valaria Kohut MD FACP,FACC,FAHA, [facility] ClinicFSCAI 02/28/2014 10:43 AM

## 2014-02-28 NOTE — Assessment & Plan Note (Signed)
Not on statin therapy at patient's request

## 2014-02-28 NOTE — Assessment & Plan Note (Signed)
On CPAP. ?

## 2014-02-28 NOTE — Patient Instructions (Signed)
Your physician wants you to follow-up in: 1 year with Dr Berry. You will receive a reminder letter in the mail two months in advance. If you don't receive a letter, please call our office to schedule the follow-up appointment.  

## 2014-03-10 ENCOUNTER — Encounter: Payer: Self-pay | Admitting: Cardiovascular Disease

## 2014-05-31 ENCOUNTER — Other Ambulatory Visit: Payer: Self-pay | Admitting: Endocrinology

## 2014-05-31 DIAGNOSIS — E039 Hypothyroidism, unspecified: Secondary | ICD-10-CM

## 2014-05-31 DIAGNOSIS — E040 Nontoxic diffuse goiter: Secondary | ICD-10-CM | POA: Diagnosis not present

## 2014-11-21 DIAGNOSIS — E039 Hypothyroidism, unspecified: Secondary | ICD-10-CM | POA: Diagnosis not present

## 2014-11-21 DIAGNOSIS — I4891 Unspecified atrial fibrillation: Secondary | ICD-10-CM | POA: Diagnosis not present

## 2014-11-21 DIAGNOSIS — Z0000 Encounter for general adult medical examination without abnormal findings: Secondary | ICD-10-CM | POA: Diagnosis not present

## 2014-11-21 DIAGNOSIS — Z1389 Encounter for screening for other disorder: Secondary | ICD-10-CM | POA: Diagnosis not present

## 2014-11-28 DIAGNOSIS — E039 Hypothyroidism, unspecified: Secondary | ICD-10-CM | POA: Diagnosis not present

## 2014-11-28 DIAGNOSIS — E782 Mixed hyperlipidemia: Secondary | ICD-10-CM | POA: Diagnosis not present

## 2014-11-28 DIAGNOSIS — I480 Paroxysmal atrial fibrillation: Secondary | ICD-10-CM | POA: Diagnosis not present

## 2014-11-28 DIAGNOSIS — K219 Gastro-esophageal reflux disease without esophagitis: Secondary | ICD-10-CM | POA: Diagnosis not present

## 2014-11-30 DIAGNOSIS — Z1231 Encounter for screening mammogram for malignant neoplasm of breast: Secondary | ICD-10-CM | POA: Diagnosis not present

## 2014-11-30 DIAGNOSIS — Z01419 Encounter for gynecological examination (general) (routine) without abnormal findings: Secondary | ICD-10-CM | POA: Diagnosis not present

## 2014-11-30 DIAGNOSIS — Z779 Other contact with and (suspected) exposures hazardous to health: Secondary | ICD-10-CM | POA: Diagnosis not present

## 2014-11-30 DIAGNOSIS — Z124 Encounter for screening for malignant neoplasm of cervix: Secondary | ICD-10-CM | POA: Diagnosis not present

## 2014-12-23 ENCOUNTER — Other Ambulatory Visit: Payer: Self-pay | Admitting: Cardiovascular Disease

## 2014-12-25 NOTE — Telephone Encounter (Signed)
Rx(s) sent to pharmacy electronically.  

## 2015-01-10 DIAGNOSIS — H25043 Posterior subcapsular polar age-related cataract, bilateral: Secondary | ICD-10-CM | POA: Diagnosis not present

## 2015-01-10 DIAGNOSIS — H2513 Age-related nuclear cataract, bilateral: Secondary | ICD-10-CM | POA: Diagnosis not present

## 2015-01-26 ENCOUNTER — Ambulatory Visit (INDEPENDENT_AMBULATORY_CARE_PROVIDER_SITE_OTHER): Payer: Medicare Other | Admitting: Podiatrist

## 2015-01-26 ENCOUNTER — Encounter: Payer: Self-pay | Admitting: Podiatrist

## 2015-01-26 VITALS — BP 132/60 | HR 52 | Resp 12

## 2015-01-26 DIAGNOSIS — Q828 Other specified congenital malformations of skin: Secondary | ICD-10-CM

## 2015-01-26 NOTE — Progress Notes (Signed)
   Subjective:    Patient ID: Shannon Griffin, female    DOB: 1946/10/05, 69 y.o.   MRN: [id number]  HPI PT STATED LT HAVE A LITTLE HARD SKIN AND BEEN HURTING FOR 2 MONTHS. THE FOOT IS WORSE ESPECIALLY WHEN WALKING BARE FOOT. TRIED NO TREATMENT.   Review of Systems  HENT: Positive for ear pain and sinus pressure.        Objective:   Physical Exam  Patient is awake, alert, and oriented x 3.  In no acute distress.  Vascular status is intact with palpable pedal pulses at 2/4 DP and PT bilateral and capillary refill time within normal limits. Neurological sensation is also intact bilaterally via Semmes Weinstein monofilament at 5/5 sites. Light touch, vibratory sensation, Achilles tendon reflex is intact. Dermatological exam reveals skin color, turger and texture as normal. No open lesions present.  Musculature intact with dorsiflexion, plantarflexion, inversion, eversion.    Discreet porokeratotic lesions x 2 located on the lateral side of the right foot sub fifth met head.  No redness, swelling or signs of infection noted.  No sign of foreign body noted.      Assessment & Plan:  Porokeratotic lesion right foot  Plan:  debridemen of lesion performed and salinocaine and padding applied.  She will call if this does not improve.

## 2015-03-06 ENCOUNTER — Encounter: Payer: Self-pay | Admitting: Cardiovascular Disease

## 2015-03-06 ENCOUNTER — Ambulatory Visit (INDEPENDENT_AMBULATORY_CARE_PROVIDER_SITE_OTHER): Payer: Medicare Other | Admitting: Cardiovascular Disease

## 2015-03-06 VITALS — BP 148/76 | HR 52 | Ht 61.0 in | Wt 243.1 lb

## 2015-03-06 DIAGNOSIS — E785 Hyperlipidemia, unspecified: Secondary | ICD-10-CM | POA: Diagnosis not present

## 2015-03-06 DIAGNOSIS — G4733 Obstructive sleep apnea (adult) (pediatric): Secondary | ICD-10-CM | POA: Diagnosis not present

## 2015-03-06 DIAGNOSIS — I480 Paroxysmal atrial fibrillation: Secondary | ICD-10-CM

## 2015-03-06 NOTE — Progress Notes (Addendum)
03/06/2015 Shannon Griffin   Jan 01, 1946  [id number]  Primary Physician Georgianne FickAMACHANDRAN,AJITH, MD Primary Cardiologist: Runell GessJonathan J. Daijanae Rafalski MD Roseanne RenoFACP,FACC,FAHA, FSCAI   HPI:   Shannon Griffin is a 69 year old moderately overweight widowed Caucasian female mother of one daughter, grandmother and one grandchild who I last saw one year ago. [place]. Her history is remarkable for paroxysmal atrial fibrillation and mild hyperlipidemia as well as obesity and obstructive sleep apnea. She wears C Pap which she benefits from.. She denies chest pain or shortness of breath. She had a 2-D echocardiogram performed in the past that showed normal LV size and function with moderate to severe left atrial enlargement. Since I saw her a year ago she's remained stable. She does work in the yard. She is unable to lose weight however. She gets occasional episodes of palpitations which may be brief though infrequent episodes of PAF currently in sinus rhythm.   Current Outpatient Prescriptions  Medication Sig Dispense Refill  . aspirin 81 MG tablet Take 81 mg by mouth daily.    . bisoprolol (ZEBETA) 5 MG tablet Take 0.5 tablet (2.5 mg total) by mouth every morning and take 1 tablet (5 mg total) by mouth every evening. 135 tablet 0  . levothyroxine (SYNTHROID, LEVOTHROID) 100 MCG tablet Take 100 mcg by mouth daily before breakfast.     No current facility-administered medications for this visit.    Allergies  Allergen Reactions  . Lidocaine Swelling    History   Social History  . Marital Status: Widowed    Spouse Name: N/A  . Number of Children: N/A  . Years of Education: N/A   Occupational History  . Not on file.   Social History Main Topics  . Smoking status: Never Smoker   . Smokeless tobacco: Not on file  . Alcohol Use: No  . Drug Use: No  . Sexual Activity: No   Other Topics Concern  . Not on file   Social History Narrative     Review of  Systems: General: negative for chills, fever, night sweats or weight changes.  Cardiovascular: negative for chest pain, dyspnea on exertion, edema, orthopnea, palpitations, paroxysmal nocturnal dyspnea or shortness of breath Dermatological: negative for rash Respiratory: negative for cough or wheezing Urologic: negative for hematuria Abdominal: negative for nausea, vomiting, diarrhea, bright red blood per rectum, melena, or hematemesis Neurologic: negative for visual changes, syncope, or dizziness All other systems reviewed and are otherwise negative except as noted above.    Blood pressure 148/76, pulse 52, height 5\' 1"  (1.549 m), weight 243 lb 1.6 oz (110.269 kg).  General appearance: alert and no distress Neck: no adenopathy, no carotid bruit, no JVD, supple, symmetrical, trachea midline and thyroid not enlarged, symmetric, no tenderness/mass/nodules Lungs: clear to auscultation bilaterally Heart: regular rate and rhythm, S1, S2 normal, no murmur, click, rub or gallop Extremities: extremities normal, atraumatic, no cyanosis or edema  EKG sinus bradycardia 52 without ST or T-wave changes. I personally reviewed this EKG  ASSESSMENT AND PLAN:   Paroxysmal atrial fibrillation History of PAF currently in sinus rhythm. She gets occasional palpitations that are rare and self-limited.   Obstructive sleep apnea History of obstructive sleep apnea on sleep   Hyperlipidemia History of hyperlipidemia. She refuses to take a statin drug. I did talk to her about PCSK9 monoclonal injectables which she is not interested in at this time       Runell GessJonathan J. Kayleen Alig MD [facility] LLCFACP,FACC,FAHA,  FSCAI 03/06/2015 9:20 AM

## 2015-03-06 NOTE — Assessment & Plan Note (Signed)
History of hyperlipidemia. She refuses to take a statin drug. I did talk to her about PCSK9 monoclonal injectables which she is not interested in at this time

## 2015-03-06 NOTE — Assessment & Plan Note (Signed)
History of obstructive sleep apnea on sleep

## 2015-03-06 NOTE — Patient Instructions (Signed)
Your physician wants you to follow-up in: 1 year with Dr Berry. You will receive a reminder letter in the mail two months in advance. If you don't receive a letter, please call our office to schedule the follow-up appointment.  

## 2015-03-06 NOTE — Assessment & Plan Note (Signed)
History of PAF currently in sinus rhythm. She gets occasional palpitations that are rare and self-limited.

## 2015-03-23 ENCOUNTER — Other Ambulatory Visit: Payer: Self-pay | Admitting: Cardiovascular Disease

## 2015-03-23 NOTE — Telephone Encounter (Signed)
Rx(s) sent to pharmacy electronically.  

## 2015-03-30 ENCOUNTER — Telehealth: Payer: Self-pay | Admitting: Cardiovascular Disease

## 2015-03-30 NOTE — Telephone Encounter (Signed)
Pt says she take 2 81 mg aspirin a day. On the medication sheet she got when she left here,it said 1 aspirin a day.

## 2015-03-30 NOTE — Telephone Encounter (Signed)
SPOKE TO PATIENT SHE STATES THE INFORMATION WAS CORRECT ON THE MEDICATION LIST RN INFORMED PATIENT WILL CORRECT CURRENT MEDICATION LIST AND INFORM DR BERRY AND NURSE. 2 TABLETS OF 81 MG DAILY.

## 2015-04-19 DIAGNOSIS — E039 Hypothyroidism, unspecified: Secondary | ICD-10-CM | POA: Diagnosis not present

## 2015-04-30 DIAGNOSIS — E039 Hypothyroidism, unspecified: Secondary | ICD-10-CM | POA: Diagnosis not present

## 2015-05-17 DIAGNOSIS — E039 Hypothyroidism, unspecified: Secondary | ICD-10-CM | POA: Diagnosis not present

## 2015-05-24 DIAGNOSIS — E039 Hypothyroidism, unspecified: Secondary | ICD-10-CM | POA: Diagnosis not present

## 2015-06-01 ENCOUNTER — Other Ambulatory Visit: Payer: Medicare Other

## 2015-06-07 DIAGNOSIS — E039 Hypothyroidism, unspecified: Secondary | ICD-10-CM | POA: Diagnosis not present

## 2015-06-21 DIAGNOSIS — E039 Hypothyroidism, unspecified: Secondary | ICD-10-CM | POA: Diagnosis not present

## 2015-06-29 ENCOUNTER — Telehealth: Payer: Self-pay | Admitting: Cardiovascular Disease

## 2015-06-29 NOTE — Telephone Encounter (Signed)
Pt would like Dr Allyson Sabal to recommend a primary doctor for her in [place] please.

## 2015-06-29 NOTE — Telephone Encounter (Signed)
Routed message to K. Vogel to ask if Dr. Allyson Sabal knows of a primary doctor he would refer her to in the [place] area.

## 2015-07-03 ENCOUNTER — Telehealth: Payer: Self-pay | Admitting: Cardiovascular Disease

## 2015-07-03 NOTE — Telephone Encounter (Signed)
C-Pap machine can no longer be serviced by [facility].What does she need to do next?

## 2015-07-05 DIAGNOSIS — E039 Hypothyroidism, unspecified: Secondary | ICD-10-CM | POA: Diagnosis not present

## 2015-07-05 NOTE — Telephone Encounter (Signed)
I spoke with patient.

## 2015-07-10 ENCOUNTER — Telehealth: Payer: Self-pay | Admitting: Cardiovascular Disease

## 2015-07-10 NOTE — Telephone Encounter (Signed)
Pt called in stating that he insurance company has switch DME suppliers and they are now requesting a new prescription for her CPAP supplies and most recent office notes. The name of the company is [facility] and the fax number is [phone]. Please f/u with the pt  Thanks

## 2015-07-12 NOTE — Telephone Encounter (Signed)
Per jennifer @ [facility] she has taken care of this referral.

## 2015-07-12 NOTE — Telephone Encounter (Signed)
Per Victorino Dike @ [facility] she has taken care of this.

## 2015-07-19 DIAGNOSIS — E039 Hypothyroidism, unspecified: Secondary | ICD-10-CM | POA: Diagnosis not present

## 2015-07-25 DIAGNOSIS — E039 Hypothyroidism, unspecified: Secondary | ICD-10-CM | POA: Diagnosis not present

## 2015-08-09 DIAGNOSIS — E039 Hypothyroidism, unspecified: Secondary | ICD-10-CM | POA: Diagnosis not present

## 2015-08-22 ENCOUNTER — Telehealth: Payer: Self-pay | Admitting: Cardiovascular Disease

## 2015-08-22 NOTE — Progress Notes (Signed)
   Patient ID: Shannon LuoJeanne Griffin, female    DOB: 1945-10-25, 69 y.o.   MRN: [id number]  HPI    Review of Systems    Physical Exam

## 2015-08-22 NOTE — Telephone Encounter (Signed)
Needs notes faxed [phone] [facility]) for her CPAP. / tg

## 2015-08-22 NOTE — Telephone Encounter (Signed)
Called and spoke with [facility] to inquire exactly  What they need. Per the representative they need a compliance note after jan 2014.  Dr. Allyson SabalBerry notified that he needs to addend his May 2016 note to say patient is benefiting from her CPAP therapy.

## 2015-08-22 NOTE — Telephone Encounter (Signed)
Dr. Hazle CocaBerry's 03/06/15 office note faxed to [facility] @ [phone].

## 2015-08-23 DIAGNOSIS — E039 Hypothyroidism, unspecified: Secondary | ICD-10-CM | POA: Diagnosis not present

## 2015-09-06 DIAGNOSIS — E039 Hypothyroidism, unspecified: Secondary | ICD-10-CM | POA: Diagnosis not present

## 2015-09-25 DIAGNOSIS — E039 Hypothyroidism, unspecified: Secondary | ICD-10-CM | POA: Diagnosis not present

## 2015-09-28 ENCOUNTER — Telehealth: Payer: Self-pay | Admitting: *Deleted

## 2015-09-28 LAB — LAB REPORT - SCANNED
A1c: 5.3
EGFR (Non-African Amer.): 75
Free T4: 1.2 ng/dL
TSH: 4.98

## 2015-09-28 NOTE — Telephone Encounter (Signed)
Returned CPAP supply order to [facility]. 

## 2015-11-08 DIAGNOSIS — I471 Supraventricular tachycardia: Secondary | ICD-10-CM | POA: Diagnosis not present

## 2015-11-08 DIAGNOSIS — Z1389 Encounter for screening for other disorder: Secondary | ICD-10-CM | POA: Diagnosis not present

## 2015-11-08 DIAGNOSIS — Z9181 History of falling: Secondary | ICD-10-CM | POA: Diagnosis not present

## 2015-11-08 DIAGNOSIS — Z2821 Immunization not carried out because of patient refusal: Secondary | ICD-10-CM | POA: Diagnosis not present

## 2015-12-05 DIAGNOSIS — Z1231 Encounter for screening mammogram for malignant neoplasm of breast: Secondary | ICD-10-CM | POA: Diagnosis not present

## 2015-12-05 DIAGNOSIS — Z124 Encounter for screening for malignant neoplasm of cervix: Secondary | ICD-10-CM | POA: Diagnosis not present

## 2015-12-13 ENCOUNTER — Ambulatory Visit (INDEPENDENT_AMBULATORY_CARE_PROVIDER_SITE_OTHER): Payer: Medicare Other | Admitting: Sports Medicine

## 2015-12-13 ENCOUNTER — Encounter: Payer: Self-pay | Admitting: Sports Medicine

## 2015-12-13 DIAGNOSIS — Q828 Other specified congenital malformations of skin: Secondary | ICD-10-CM

## 2015-12-13 DIAGNOSIS — M79671 Pain in right foot: Secondary | ICD-10-CM | POA: Diagnosis not present

## 2015-12-13 NOTE — Progress Notes (Signed)
Patient ID: Shannon Griffin, female   DOB: November 09, 1945, 70 y.o.   MRN: [id number] Subjective: Shannon Griffin is a 70 y.o. female patient who presents to office for evaluation of Right foot pain. Patient complains of pain at the lesion present Right foot at the ball. Patient reports that years ago stepped on a thorn and had it removed and started having painful callus to area of which she was treated by Dr. Irving Shows with trimming of lesion that lasted for 8 months. Patient denies any other pedal complaints.   Patient Active Problem List   Diagnosis Date Noted  . Paroxysmal atrial fibrillation (HCC) 02/28/2014  . Hyperlipidemia 02/28/2014  . Obstructive sleep apnea 02/28/2014  . Morbid obesity (HCC) 02/28/2014    Current Outpatient Prescriptions on File Prior to Visit  Medication Sig Dispense Refill  . aspirin 81 MG tablet Take 162 mg by mouth daily.     . bisoprolol (ZEBETA) 5 MG tablet TAKE ONE-HALF (1/2) TABLET (2.5 MG TOTAL) EVERY MORNING AND TAKE 1 TABLET EVERY EVENING 135 tablet 3  . levothyroxine (SYNTHROID, LEVOTHROID) 100 MCG tablet Take 100 mcg by mouth daily before breakfast.     No current facility-administered medications on file prior to visit.    Allergies  Allergen Reactions  . Lidocaine Swelling    Objective:  General: Alert and oriented x3 in no acute distress  Dermatology: Keratotic lesion present measuring <0.3cm at plantar 5th MTPJ on right with skin lines transversing the lesion, pain is present with direct pressure to the lesion with a central nucleated core noted suggestive of porokeratosis, no webspace macerations, no ecchymosis bilateral, all nails x 10 are well manicured.  Vascular: Dorsalis Pedis and Posterior Tibial pedal pulses 2/4, Capillary Fill Time 3 seconds, + pedal hair growth bilateral, no edema bilateral lower extremities, Temperature gradient within normal limits.  Neurology: Gross sensation intact via light touch bilateral.  Musculoskeletal: Mild  tenderness with palpation at the lesion site on Right, Muscular strength 5/5 in all groups without pain or limitation on range of motion. Planus foot type bilateral.    Assessment and Plan: Problem List Items Addressed This Visit    None    Visit Diagnoses    Porokeratosis    -  Primary    Foot pain, right          -Complete examination performed -Discussed treatment options -Mechanically debrided keratoic lesion using a chisel blade; treated the area with Salinocaine covered with Moleskin -Recommend daily skin emollients and supportive shoes and inserts; patient purchased powersteps at today's visit; if fail to improve with OTC inserts will consider custom molded inserts; patient is aware of cost.  -Patient to return to office as needed or sooner if condition worsens.  Shannon Griffin, DPM

## 2015-12-13 NOTE — Patient Instructions (Signed)
Luberderm or Eucerin Cream or Okeeffe's healthy feet Creams can be purchased at [employer] or [employer]

## 2015-12-24 DIAGNOSIS — Z79899 Other long term (current) drug therapy: Secondary | ICD-10-CM | POA: Diagnosis not present

## 2015-12-24 DIAGNOSIS — E039 Hypothyroidism, unspecified: Secondary | ICD-10-CM | POA: Diagnosis not present

## 2015-12-24 DIAGNOSIS — E049 Nontoxic goiter, unspecified: Secondary | ICD-10-CM | POA: Diagnosis not present

## 2015-12-24 DIAGNOSIS — I471 Supraventricular tachycardia: Secondary | ICD-10-CM | POA: Diagnosis not present

## 2015-12-24 DIAGNOSIS — H6120 Impacted cerumen, unspecified ear: Secondary | ICD-10-CM | POA: Diagnosis not present

## 2015-12-24 DIAGNOSIS — E785 Hyperlipidemia, unspecified: Secondary | ICD-10-CM | POA: Diagnosis not present

## 2015-12-24 DIAGNOSIS — E7800 Pure hypercholesterolemia, unspecified: Secondary | ICD-10-CM | POA: Diagnosis not present

## 2015-12-24 DIAGNOSIS — Z0000 Encounter for general adult medical examination without abnormal findings: Secondary | ICD-10-CM | POA: Diagnosis not present

## 2015-12-24 DIAGNOSIS — D510 Vitamin B12 deficiency anemia due to intrinsic factor deficiency: Secondary | ICD-10-CM | POA: Diagnosis not present

## 2015-12-27 DIAGNOSIS — E010 Iodine-deficiency related diffuse (endemic) goiter: Secondary | ICD-10-CM | POA: Diagnosis not present

## 2016-01-22 DIAGNOSIS — H2513 Age-related nuclear cataract, bilateral: Secondary | ICD-10-CM | POA: Diagnosis not present

## 2016-01-22 DIAGNOSIS — H524 Presbyopia: Secondary | ICD-10-CM | POA: Diagnosis not present

## 2016-03-05 ENCOUNTER — Ambulatory Visit (INDEPENDENT_AMBULATORY_CARE_PROVIDER_SITE_OTHER): Payer: Medicare Other | Admitting: Cardiovascular Disease

## 2016-03-05 ENCOUNTER — Encounter: Payer: Self-pay | Admitting: Cardiovascular Disease

## 2016-03-05 VITALS — BP 132/80 | HR 60 | Ht 61.5 in | Wt 245.2 lb

## 2016-03-05 DIAGNOSIS — E785 Hyperlipidemia, unspecified: Secondary | ICD-10-CM

## 2016-03-05 DIAGNOSIS — I480 Paroxysmal atrial fibrillation: Secondary | ICD-10-CM

## 2016-03-05 DIAGNOSIS — G4733 Obstructive sleep apnea (adult) (pediatric): Secondary | ICD-10-CM

## 2016-03-05 NOTE — Assessment & Plan Note (Signed)
History of paroxysmal atrial fibrillation maintaining sinus rhythm only on aspirin at this point. She does have palpitation episodes several times a week. The CHA2DSVASC2 score is 2  . We discussed the benefit of oral anticoagulation regarding stroke prophylaxis which she will think about.

## 2016-03-05 NOTE — Patient Instructions (Signed)

## 2016-03-05 NOTE — Assessment & Plan Note (Signed)
istory of obstructive sleep apnea on sleep Which she benefits from

## 2016-03-05 NOTE — Progress Notes (Signed)
03/05/2016 Shannon Griffin   Mar 02, 1946  [id number]  Primary Physician REDDING [name]., MD Primary Cardiologist: [name] Roseanne Reno   HPI:  Shannon Griffin is a 70 year old moderately overweight widowed Caucasian female mother of one daughter, grandmother and one grandchild who I last saw 03/06/15. She relocated from [place] to be closer to her family. Her history is remarkable for paroxysmal atrial fibrillation and mild hyperlipidemia as well as obesity and obstructive sleep apnea. She wears C Pap which she benefits from.. She denies chest pain or shortness of breath. She had a 2-D echocardiogram performed in the past that showed normal LV size and function with moderate to severe left atrial enlargement. Since I saw her a year ago she's remained stable. She does work in the yard. She is unable to lose weight however. She gets occasional episodes of palpitations which may be brief though infrequent episodes of PAF currently in sinus rhythm. The CHA2DSVASC2 score is 2  . We discussed the risks and benefits of oral anticoagulation with regard to stroke prophylaxis.   Current Outpatient Prescriptions  Medication Sig Dispense Refill  . aspirin 81 MG tablet Take 162 mg by mouth daily.     . bisoprolol (ZEBETA) 5 MG tablet TAKE ONE-HALF (1/2) TABLET (2.5 MG TOTAL) EVERY MORNING AND TAKE 1 TABLET EVERY EVENING 135 tablet 3  . ELDERBERRY PO Take 1 tablet by mouth daily.    Marland Kitchen KRILL OIL PO Take 1 tablet by mouth daily.    Marland Kitchen levothyroxine (SYNTHROID, LEVOTHROID) 100 MCG tablet Take 100 mcg by mouth daily before breakfast.    . Multiple Vitamins-Minerals (MULTIVITAMIN WITH MINERALS) tablet Take 1 tablet by mouth daily.    . vitamin C (ASCORBIC ACID) 500 MG tablet Take 500 mg by mouth daily.    . Zinc Acetate, Oral, (ZINC ACETATE PO) Take 1 tablet by mouth daily.     No current facility-administered medications for this visit.    Allergies  Allergen Reactions  . Lidocaine  Swelling    Social History   Social History  . Marital Status: Widowed    Spouse Name: N/A  . Number of Children: N/A  . Years of Education: N/A   Occupational History  . Not on file.   Social History Main Topics  . Smoking status: Never Smoker   . Smokeless tobacco: Not on file  . Alcohol Use: No  . Drug Use: No  . Sexual Activity: No   Other Topics Concern  . Not on file   Social History Narrative     Review of Systems: General: negative for chills, fever, night sweats or weight changes.  Cardiovascular: negative for chest pain, dyspnea on exertion, edema, orthopnea, palpitations, paroxysmal nocturnal dyspnea or shortness of breath Dermatological: negative for rash Respiratory: negative for cough or wheezing Urologic: negative for hematuria Abdominal: negative for nausea, vomiting, diarrhea, bright red blood per rectum, melena, or hematemesis Neurologic: negative for visual changes, syncope, or dizziness All other systems reviewed and are otherwise negative except as noted above.    Blood pressure 132/80, pulse 60, height 5' 1.5" (1.562 m), weight 245 lb 4 oz (111.245 kg).  General appearance: alert and no distress Neck: no adenopathy, no carotid bruit, no JVD, supple, symmetrical, trachea midline and thyroid not enlarged, symmetric, no tenderness/mass/nodules Lungs: clear to auscultation bilaterally Heart: regular rate and rhythm, S1, S2 normal, no murmur, click, rub or gallop Extremities: extremities normal, atraumatic, no cyanosis or edema  EKG normal sinus  rhythm at 61 without ST or T-wave changes. I personally reviewed this EKG  ASSESSMENT AND PLAN:   Paroxysmal atrial fibrillation History of paroxysmal atrial fibrillation maintaining sinus rhythm only on aspirin at this point. She does have palpitation episodes several times a week. The CHA2DSVASC2 score is 2  . We discussed the benefit of oral anticoagulation regarding stroke prophylaxis which she will  think about.  Hyperlipidemia History of hyperlipidemia not on statin therapy at her request followed by her PCP  Obstructive sleep apnea istory of obstructive sleep apnea on sleep Which she benefits from      Runell GessJonathan J. Jacqulyn Barresi MD [facility] LLCFACP,FACC,FAHA, [facility]SCAI 03/05/2016 10:25 AM

## 2016-03-05 NOTE — Assessment & Plan Note (Signed)
History of hyperlipidemia not on statin therapy at her request followed by her PCP

## 2016-03-11 ENCOUNTER — Telehealth: Payer: Self-pay | Admitting: Cardiovascular Disease

## 2016-03-11 NOTE — Telephone Encounter (Signed)
New Message  Pt stated that after last OV- pt is to follow up w/ someone about her aspirin/bloodthinner- pt did not know who to schedule with- did not find in AVS about f/u. Please call back and discuss.

## 2016-03-11 NOTE — Telephone Encounter (Signed)
Called pt back. She said she did want to start on a blood thinner.  Per last ov note with Dr Allyson SabalBerry, they discussed pt starting anticoagulation but the pt would think about it.  Pt stated he said she would see one of the pharmacists if that's what she wanted to do. Pt schedule with [facility] for appt to begin therapy.  Will route to Dr Allyson SabalBerry as Lorain ChildesFYI.

## 2016-03-18 ENCOUNTER — Other Ambulatory Visit: Payer: Self-pay | Admitting: Cardiovascular Disease

## 2016-03-18 NOTE — Telephone Encounter (Signed)
Rx(s) sent to pharmacy electronically.  

## 2016-03-27 ENCOUNTER — Ambulatory Visit (INDEPENDENT_AMBULATORY_CARE_PROVIDER_SITE_OTHER): Payer: Medicare Other | Admitting: Pharmacist

## 2016-03-27 VITALS — Wt 245.2 lb

## 2016-03-27 DIAGNOSIS — I4891 Unspecified atrial fibrillation: Secondary | ICD-10-CM | POA: Diagnosis not present

## 2016-03-27 DIAGNOSIS — I480 Paroxysmal atrial fibrillation: Secondary | ICD-10-CM | POA: Diagnosis not present

## 2016-03-27 LAB — BASIC METABOLIC PANEL
BUN: 15 mg/dL (ref 7–25)
CHLORIDE: 104 mmol/L (ref 98–110)
CO2: 27 mmol/L (ref 20–31)
Calcium: 9.2 mg/dL (ref 8.6–10.4)
Creat: 0.8 mg/dL (ref 0.60–0.93)
Glucose, Bld: 75 mg/dL (ref 65–99)
POTASSIUM: 4.2 mmol/L (ref 3.5–5.3)
Sodium: 140 mmol/L (ref 135–146)

## 2016-03-27 MED ORDER — APIXABAN 5 MG PO TABS: 5.0000 mg | ORAL_TABLET | Freq: Two times a day (BID) | ORAL | Status: DC

## 2016-03-27 NOTE — Patient Instructions (Signed)
Start taking Eliquis 5mg  twice daily.   Go to lab for metabolic panel today.   Please call [phone] if you have any issues with new medication.

## 2016-03-27 NOTE — Progress Notes (Signed)
Pt was started on Eliquis for PAF on June 8th 2017.  Reviewed patients medication list.  Pt is not currently on any combined P-gp and strong CYP3A4 inhibitors/inducers (ketoconazole, traconazole, ritonavir, carbamazepine, phenytoin, rifampin, St. John's wort).  Reviewed labs.  Weight 111kg.  Dose 5mg  BID based on age, weight.   A full discussion of the nature of anticoagulants has been carried out.  A benefit/risk analysis has been presented to the patient, so that they understand the justification for choosing anticoagulation with Eliquis at this time.  The need for compliance is stressed.  Pt is aware to take the medication twice daily.  Side effects of potential bleeding are discussed, including unusual colored urine or stools, coughing up blood or coffee ground emesis, nose bleeds or serious fall or head trauma.  Discussed signs and symptoms of stroke. The patient should avoid any OTC items containing aspirin or ibuprofen.  Avoid alcohol consumption.   Call if any signs of abnormal bleeding.  Discussed financial obligations and resolved any difficulty in obtaining medication.    Labs today for kidney function in case agent must be changed with change of insurance in July.

## 2016-03-28 ENCOUNTER — Encounter: Payer: Self-pay | Admitting: Cardiovascular Disease

## 2016-03-28 DIAGNOSIS — E7800 Pure hypercholesterolemia, unspecified: Secondary | ICD-10-CM

## 2016-03-28 DIAGNOSIS — G4733 Obstructive sleep apnea (adult) (pediatric): Secondary | ICD-10-CM

## 2016-03-28 DIAGNOSIS — I480 Paroxysmal atrial fibrillation: Secondary | ICD-10-CM

## 2016-04-07 ENCOUNTER — Telehealth: Payer: Self-pay | Admitting: Cardiovascular Disease

## 2016-04-07 NOTE — Telephone Encounter (Signed)
Spoke with Shannon Griffin, pharm md, explained to pt do not feel the current symptoms are related to starting eliquis. Explained we would only need to stop the eliquis if pt has bleeding between bowel movements or if the symptoms last longer than couple days. She has not taken anything for the diarrhea, immodium was recommended. Patient voiced understanding to contact her medical doctor for evaluation if symptoms continue.

## 2016-04-07 NOTE — Telephone Encounter (Signed)
New message    Pt c/o medication issue:  1. Name of Medication: Eliquis  2. How are you currently taking this medication (dosage and times per day)? 5 mg po twice daily  3. Are you having a reaction (difficulty breathing--STAT)? Yes--started yesterday all diarrhea and now bleeding hemorrhoids 4. What is your medication issue? The pt is having headache and. Pain, when going to the bathroom with each bowel movement her hemorrhoids are bleeding main concern.

## 2016-04-07 NOTE — Telephone Encounter (Signed)
Spoke with pt, she developed a headache yesterday and diarrhea. During the night, when getting up to the bathroom, her hemorrhoids have become aggravated and they are bleeding. She only has bleeding when she has a bowel movement, which is frequent from the diarrhea. She has been on eliquis x one week now. She does feel achy and has post-nasal drip. She is using witch hazel compresses on her hemorrhoids. Will discuss with pharm md.

## 2016-04-15 DIAGNOSIS — M25532 Pain in left wrist: Secondary | ICD-10-CM | POA: Diagnosis not present

## 2016-04-15 DIAGNOSIS — K649 Unspecified hemorrhoids: Secondary | ICD-10-CM | POA: Diagnosis not present

## 2016-05-01 DIAGNOSIS — E042 Nontoxic multinodular goiter: Secondary | ICD-10-CM | POA: Diagnosis not present

## 2016-05-01 DIAGNOSIS — E038 Other specified hypothyroidism: Secondary | ICD-10-CM | POA: Diagnosis not present

## 2016-05-01 DIAGNOSIS — E041 Nontoxic single thyroid nodule: Secondary | ICD-10-CM | POA: Diagnosis not present

## 2016-05-07 DIAGNOSIS — E038 Other specified hypothyroidism: Secondary | ICD-10-CM | POA: Diagnosis not present

## 2016-06-03 ENCOUNTER — Telehealth: Payer: Self-pay | Admitting: Cardiovascular Disease

## 2016-06-03 NOTE — Telephone Encounter (Signed)
Pt called with a question about a medication  ELIQUIS  Can she take TURMERIC??

## 2016-06-03 NOTE — Telephone Encounter (Signed)
Turmeric can increase risk of bleeding. Would not recommend taking this product in combination with Eliquis.

## 2016-06-03 NOTE — Telephone Encounter (Signed)
Returned call to patient.Advised I will send message to our pharmacist's on advice if you can take Tumeric with Eliquis.

## 2016-06-03 NOTE — Telephone Encounter (Signed)
Returned call to patient Shannon Griffin's advice given. 

## 2016-08-05 ENCOUNTER — Telehealth: Payer: Self-pay | Admitting: Cardiovascular Disease

## 2016-08-05 MED ORDER — APIXABAN 5 MG PO TABS: 5.0000 mg | ORAL_TABLET | Freq: Two times a day (BID) | ORAL | 1 refills | Status: DC

## 2016-08-05 NOTE — Telephone Encounter (Signed)
New Message  Pt call requesting to speak with the Pharmacist about how much asprin she is allow to take with eliquis. Please call back to discuss

## 2016-08-05 NOTE — Telephone Encounter (Signed)
Spoke to patient about arthritis. She has tried topical rubs and heat to areas without relief. She states the pain is unbearable.  We discussed using lowest effective dose for least time possible. Recommended against doses above package labeling.   She asks about other agents to help with arthritis especially natural products. Again recommended against as many of these have drug interactions.  Pt [facility]   Patient states understanding and appreciation.

## 2016-08-05 NOTE — Telephone Encounter (Signed)
Spoke with pt states that she has been taking 2-325mg  asa as needed for sinus headaches, now she is having arthritis pain and would like to take more often for her pain. She states that she cannot take tylenol due to the "negative effects to her liver". She states that years ago she was taking warfarin for 4 years and she states that her Md let her take asa freely as often as she needed. She is wondering if this is true with Eliquis. Please advise

## 2016-08-06 DIAGNOSIS — Z2821 Immunization not carried out because of patient refusal: Secondary | ICD-10-CM | POA: Diagnosis not present

## 2016-08-06 DIAGNOSIS — M1990 Unspecified osteoarthritis, unspecified site: Secondary | ICD-10-CM | POA: Diagnosis not present

## 2016-11-10 DIAGNOSIS — E039 Hypothyroidism, unspecified: Secondary | ICD-10-CM | POA: Diagnosis not present

## 2016-11-12 DIAGNOSIS — E042 Nontoxic multinodular goiter: Secondary | ICD-10-CM | POA: Insufficient documentation

## 2016-11-12 DIAGNOSIS — E039 Hypothyroidism, unspecified: Secondary | ICD-10-CM | POA: Insufficient documentation

## 2016-12-09 DIAGNOSIS — Z1231 Encounter for screening mammogram for malignant neoplasm of breast: Secondary | ICD-10-CM | POA: Diagnosis not present

## 2016-12-09 DIAGNOSIS — Z124 Encounter for screening for malignant neoplasm of cervix: Secondary | ICD-10-CM | POA: Diagnosis not present

## 2017-01-21 DIAGNOSIS — H524 Presbyopia: Secondary | ICD-10-CM | POA: Diagnosis not present

## 2017-01-21 DIAGNOSIS — H25041 Posterior subcapsular polar age-related cataract, right eye: Secondary | ICD-10-CM | POA: Diagnosis not present

## 2017-01-21 DIAGNOSIS — H2513 Age-related nuclear cataract, bilateral: Secondary | ICD-10-CM | POA: Diagnosis not present

## 2017-02-03 DIAGNOSIS — E039 Hypothyroidism, unspecified: Secondary | ICD-10-CM | POA: Diagnosis not present

## 2017-02-05 DIAGNOSIS — E042 Nontoxic multinodular goiter: Secondary | ICD-10-CM | POA: Diagnosis not present

## 2017-02-05 DIAGNOSIS — E039 Hypothyroidism, unspecified: Secondary | ICD-10-CM | POA: Diagnosis not present

## 2017-02-06 ENCOUNTER — Other Ambulatory Visit: Payer: Self-pay | Admitting: Cardiovascular Disease

## 2017-03-06 ENCOUNTER — Ambulatory Visit (INDEPENDENT_AMBULATORY_CARE_PROVIDER_SITE_OTHER): Payer: Medicare Other | Admitting: Cardiovascular Disease

## 2017-03-06 ENCOUNTER — Encounter: Payer: Self-pay | Admitting: Cardiovascular Disease

## 2017-03-06 VITALS — BP 132/84 | HR 60 | Ht 61.5 in | Wt 251.8 lb

## 2017-03-06 DIAGNOSIS — E7800 Pure hypercholesterolemia, unspecified: Secondary | ICD-10-CM | POA: Diagnosis not present

## 2017-03-06 DIAGNOSIS — G4733 Obstructive sleep apnea (adult) (pediatric): Secondary | ICD-10-CM | POA: Diagnosis not present

## 2017-03-06 DIAGNOSIS — I480 Paroxysmal atrial fibrillation: Secondary | ICD-10-CM | POA: Diagnosis not present

## 2017-03-06 NOTE — Assessment & Plan Note (Signed)
History of obstructive obstructive sleep apnea on CPAP which she benefits from

## 2017-03-06 NOTE — Patient Instructions (Signed)

## 2017-03-06 NOTE — Assessment & Plan Note (Signed)
History of hyperlipidemia not on statin therapy followed by her PCP 

## 2017-03-06 NOTE — Assessment & Plan Note (Signed)
History of paroxysmal atrial fibrillation maintaining sinus rhythm on Eliquis

## 2017-03-06 NOTE — Progress Notes (Signed)
03/06/2017 Shannon Griffin   1946-01-05  [id number]  Primary Physician Jeanie Seweredding, Valrie HartJohn F. II, MD Primary Cardiologist: Runell GessJonathan J Dezmin Kittelson MD Nicholes CalamityFACP, FACC, FAHA, [place]  HPI:  Ms. Shannon MainlandSchultis is a 71 year old moderately overweight widowed Caucasian female mother of one daughter, grandmother and one grandchild who I last saw 03/05/16. [place]. Her history is remarkable for paroxysmal atrial fibrillation and mild hyperlipidemia as well as obesity and obstructive sleep apnea. She wears C Pap which she benefits from.. She denies chest pain or shortness of breath. She had a 2-D echocardiogram performed in the past that showed normal LV size and function with moderate to severe left atrial enlargement. Since I saw her a year ago she's remained stable. She does work in the yard. She is unable to lose weight however. She gets occasional episodes of palpitations which may be brief though infrequent episodes of PAF currently in sinus rhythm. The CHA2DSVASC2 score is 2  . We discussed the risks and benefits of oral anticoagulation with regard to stroke prophylaxis. She is currently on Eliquis oral anticoagulation. Since I saw her year ago she's remained asymptomatic specifically denying chest pain. She does have some mild dyspnea on exertion probably multifactorial related to obesity and deconditioning.    Current Outpatient Prescriptions  Medication Sig Dispense Refill  . bisoprolol (ZEBETA) 5 MG tablet TAKE ONE-HALF TABLET (2.5 MG TOTAL) EVERY MORNING AND TAKE 1 TABLET EVERY EVENING 135 tablet 3  . ELDERBERRY PO Take 1 tablet by mouth daily.    Marland Kitchen. ELIQUIS 5 MG TABS tablet TAKE 1 TABLET TWICE A DAY 180 tablet 0  . KRILL OIL PO Take 1 tablet by mouth daily.    Marland Kitchen. levothyroxine (SYNTHROID, LEVOTHROID) 112 MCG tablet Take 112 mcg by mouth daily before breakfast.     . Multiple Vitamins-Minerals (MULTIVITAMIN WITH MINERALS) tablet Take 1 tablet by mouth daily.    .  vitamin C (ASCORBIC ACID) 500 MG tablet Take 500 mg by mouth daily.    . vitamin E 400 UNIT capsule Take 400 Units by mouth daily.     No current facility-administered medications for this visit.     Allergies  Allergen Reactions  . Lidocaine Swelling    Social History   Social History  . Marital status: Widowed    Spouse name: N/A  . Number of children: N/A  . Years of education: N/A   Occupational History  . Not on file.   Social History Main Topics  . Smoking status: Never Smoker  . Smokeless tobacco: Never Used  . Alcohol use No  . Drug use: No  . Sexual activity: No   Other Topics Concern  . Not on file   Social History Narrative  . No narrative on file     Review of Systems: General: negative for chills, fever, night sweats or weight changes.  Cardiovascular: negative for chest pain, dyspnea on exertion, edema, orthopnea, palpitations, paroxysmal nocturnal dyspnea or shortness of breath Dermatological: negative for rash Respiratory: negative for cough or wheezing Urologic: negative for hematuria Abdominal: negative for nausea, vomiting, diarrhea, bright red blood per rectum, melena, or hematemesis Neurologic: negative for visual changes, syncope, or dizziness All other systems reviewed and are otherwise negative except as noted above.    Blood pressure 132/84, pulse 60, height 5' 1.5" (1.562 m), weight 251 lb 12.8 oz (114.2 kg).  General appearance: alert and no distress Neck: no adenopathy, no carotid bruit, no  JVD, supple, symmetrical, trachea midline and thyroid not enlarged, symmetric, no tenderness/mass/nodules Lungs: clear to auscultation bilaterally Heart: regular rate and rhythm, S1, S2 normal, no murmur, click, rub or gallop Extremities: extremities normal, atraumatic, no cyanosis or edema  EKG sinus rhythm at 60 with left atrial enlargement. Personally reviewed this EKG  ASSESSMENT AND PLAN:   Paroxysmal atrial fibrillation History of  paroxysmal atrial fibrillation maintaining sinus rhythm on Eliquis   Hyperlipidemia History of hyperlipidemia not on statin therapy followed by her PCP  Obstructive sleep apnea History of obstructive obstructive sleep apnea on CPAP which she benefits from      [name] [facility], [facility] 03/06/2017 10:02 AM

## 2017-03-13 ENCOUNTER — Other Ambulatory Visit: Payer: Self-pay | Admitting: Cardiovascular Disease

## 2017-05-07 ENCOUNTER — Other Ambulatory Visit: Payer: Self-pay | Admitting: Cardiovascular Disease

## 2017-05-08 ENCOUNTER — Other Ambulatory Visit: Payer: Self-pay

## 2017-07-14 DIAGNOSIS — Z0000 Encounter for general adult medical examination without abnormal findings: Secondary | ICD-10-CM | POA: Diagnosis not present

## 2017-07-14 DIAGNOSIS — Z6841 Body Mass Index (BMI) 40.0 and over, adult: Secondary | ICD-10-CM | POA: Diagnosis not present

## 2017-07-14 DIAGNOSIS — J309 Allergic rhinitis, unspecified: Secondary | ICD-10-CM | POA: Diagnosis not present

## 2017-07-14 DIAGNOSIS — J029 Acute pharyngitis, unspecified: Secondary | ICD-10-CM | POA: Diagnosis not present

## 2017-07-22 DIAGNOSIS — E039 Hypothyroidism, unspecified: Secondary | ICD-10-CM | POA: Diagnosis not present

## 2017-07-22 DIAGNOSIS — I471 Supraventricular tachycardia: Secondary | ICD-10-CM | POA: Diagnosis not present

## 2017-07-22 DIAGNOSIS — Z6841 Body Mass Index (BMI) 40.0 and over, adult: Secondary | ICD-10-CM | POA: Diagnosis not present

## 2017-07-22 DIAGNOSIS — J309 Allergic rhinitis, unspecified: Secondary | ICD-10-CM | POA: Diagnosis not present

## 2017-07-22 DIAGNOSIS — R6889 Other general symptoms and signs: Secondary | ICD-10-CM | POA: Diagnosis not present

## 2017-07-22 DIAGNOSIS — R5383 Other fatigue: Secondary | ICD-10-CM | POA: Diagnosis not present

## 2017-08-04 DIAGNOSIS — E039 Hypothyroidism, unspecified: Secondary | ICD-10-CM | POA: Diagnosis not present

## 2017-08-04 DIAGNOSIS — E042 Nontoxic multinodular goiter: Secondary | ICD-10-CM | POA: Diagnosis not present

## 2017-08-06 DIAGNOSIS — E039 Hypothyroidism, unspecified: Secondary | ICD-10-CM | POA: Diagnosis not present

## 2017-08-06 DIAGNOSIS — E042 Nontoxic multinodular goiter: Secondary | ICD-10-CM | POA: Diagnosis not present

## 2017-09-08 ENCOUNTER — Telehealth: Payer: Self-pay | Admitting: *Deleted

## 2017-09-08 NOTE — Telephone Encounter (Signed)
CPAP supply order signed by Dr. Tresa EndoKelly [facility]

## 2017-09-30 DIAGNOSIS — Z9181 History of falling: Secondary | ICD-10-CM | POA: Diagnosis not present

## 2017-09-30 DIAGNOSIS — Z1331 Encounter for screening for depression: Secondary | ICD-10-CM | POA: Diagnosis not present

## 2017-09-30 DIAGNOSIS — Z23 Encounter for immunization: Secondary | ICD-10-CM | POA: Diagnosis not present

## 2017-09-30 DIAGNOSIS — I471 Supraventricular tachycardia: Secondary | ICD-10-CM | POA: Diagnosis not present

## 2017-09-30 DIAGNOSIS — Z6841 Body Mass Index (BMI) 40.0 and over, adult: Secondary | ICD-10-CM | POA: Diagnosis not present

## 2017-11-02 ENCOUNTER — Telehealth: Payer: Self-pay | Admitting: Cardiovascular Disease

## 2017-11-02 NOTE — Telephone Encounter (Signed)
Please call,having problems with C-Pap Machine.

## 2017-11-02 NOTE — Telephone Encounter (Signed)
Spoke with pt, she reports her CPAP machine is making a moaning sound that will be a higher pitch when she breaths in. She recently got a new face mask and the equipment folks recommended she get a new machine because hers is over 73 years old. Follow up appointment made with dr Tresa Endokelly to get the process started for a new machine. Patient advised to bring her machine to the appointment.

## 2017-11-03 ENCOUNTER — Other Ambulatory Visit: Payer: Self-pay | Admitting: Cardiovascular Disease

## 2017-11-18 ENCOUNTER — Ambulatory Visit (INDEPENDENT_AMBULATORY_CARE_PROVIDER_SITE_OTHER): Payer: Medicare Other | Admitting: Cardiovascular Disease

## 2017-11-18 ENCOUNTER — Encounter (INDEPENDENT_AMBULATORY_CARE_PROVIDER_SITE_OTHER): Payer: Self-pay

## 2017-11-18 ENCOUNTER — Encounter: Payer: Self-pay | Admitting: Cardiovascular Disease

## 2017-11-18 VITALS — BP 104/76 | HR 64 | Ht 61.5 in | Wt 253.4 lb

## 2017-11-18 DIAGNOSIS — E7800 Pure hypercholesterolemia, unspecified: Secondary | ICD-10-CM | POA: Diagnosis not present

## 2017-11-18 DIAGNOSIS — G4733 Obstructive sleep apnea (adult) (pediatric): Secondary | ICD-10-CM

## 2017-11-18 DIAGNOSIS — I480 Paroxysmal atrial fibrillation: Secondary | ICD-10-CM | POA: Diagnosis not present

## 2017-11-18 NOTE — Patient Instructions (Signed)
Medication Instructions:  Your physician recommends that you continue on your current medications as directed. Please refer to the Current Medication list given to you today.  Follow-Up: Your physician recommends that you schedule a follow-up appointment in: 3 months with Dr. Tresa EndoKelly (sleep clinic)   Any Other Special Instructions Will Be Listed Below (If Applicable).  Orders for new CPAP machine will be faxed to [facility].  Please let us know if you do not hear from them within 2 weeks.   If you need a refill on your cardiac medications before your next appointment, please call your pharmacy.

## 2017-11-20 ENCOUNTER — Encounter: Payer: Self-pay | Admitting: Cardiovascular Disease

## 2017-11-20 NOTE — Progress Notes (Signed)
Cardiology Office Note    Date:  11/20/2017   ID:  Shannon LuoJeanne Griffin, DOB 18-Feb-1946, MRN [id number]  PCP:  Noni Saupeedding, John F. II, MD  Cardiologist:  Nicki Guadalajarahomas Kelly, MD (sleep); Dr Allyson SabalBerry  Sleep clinic evaluation  History of Present Illness:  Shannon Griffin is a 72 y.o. female who presents to sleep clinic for evaluation of her obstructive sleep apnea.  Shannon Griffin is followed by Dr. Allyson SabalBerry for cardiology care.  She has a history of obstructive sleep apnea which was diagnosed in 2013 and had an AHI of 6.2, an RDI of 10.5, but during rim sleep sleep apnea was moderate with an HI of 23 and RDI of 37.4.  She has a history of paroxysmal atrial fibrillation.  She initiated.  CPAP at 9 cm water pressure with elimination of snoring.  She was last seen by me 5 years ago and she was compliant at her 5997 m water pressure with residual AHI at 1.5.  Over the last several years, she is continued with CPAP use.  He brought her machine to the office today with her in the office.  This was interrogated.  She continues to be compliant with 100% use with average 30 day therapy at 8.24 hours.  Her AHI was 2.0.  Recently she has noticed that her CPAP machine is making significant noise and beginning to malfunction.  She has been using an AirFit PT 10 mask and is unaware of any significant leak.  Because of machine malfunction she now presents for evaluation.  An Epworth Sleepiness Scale score wasn't calculated in the office today and this endorsed at 3, arguing against residual daytime sleepiness.  There was only a slight chance of dozing as a passenger in a car for an hour without a break, while watching TV, and while lying down to rest in the afternoon when circumstances permit.  Past Medical History:  Diagnosis Date  . Atrial fibrillation (HCC) 06/27/2011   Echo - EF 55-60%, LV wall thickness mildly increased; mod/severe LA dilation; estimated pulm arterial systolic pressure elevated at 91-47WGNF30-40mmHg  . Heart murmur   .  Hyperlipidemia   . Hypothyroid   . OSA on CPAP 10/25/2012   ahi 1.5    Past Surgical History:  Procedure Laterality Date  . CHOLECYSTECTOMY    . TONSILLECTOMY      Current Medications: Outpatient Medications Prior to Visit  Medication Sig Dispense Refill  . bisoprolol (ZEBETA) 5 MG tablet TAKE ONE-HALF TABLET (2.5 MG TOTAL) EVERY MORNING AND TAKE 1 TABLET EVERY EVENING 135 tablet 3  . ELDERBERRY PO Take 1 tablet by mouth daily.    Marland Kitchen. ELIQUIS 5 MG TABS tablet TAKE 1 TABLET TWICE A DAY 180 tablet 1  . KRILL OIL PO Take 1 tablet by mouth daily.    Marland Kitchen. levothyroxine (SYNTHROID, LEVOTHROID) 112 MCG tablet Take 112 mcg by mouth daily before breakfast.     . Multiple Vitamins-Minerals (MULTIVITAMIN WITH MINERALS) tablet Take 1 tablet by mouth daily.    . vitamin C (ASCORBIC ACID) 500 MG tablet Take 500 mg by mouth daily.    . vitamin E 400 UNIT capsule Take 400 Units by mouth daily.     No facility-administered medications prior to visit.      Allergies:   Lidocaine   Social History   Socioeconomic History  . Marital status: Widowed    Spouse name: None  . Number of children: None  . Years of education: None  . Highest education level: None  Social Needs  . Financial resource strain: None  . Food insecurity - worry: None  . Food insecurity - inability: None  . Transportation needs - medical: None  . Transportation needs - non-medical: None  Occupational History  . None  Tobacco Use  . Smoking status: Never Smoker  . Smokeless tobacco: Never Used  Substance and Sexual Activity  . Alcohol use: No  . Drug use: No  . Sexual activity: No  Other Topics Concern  . None  Social History Narrative  . None     Family History:  The patient's  family history includes Atrial fibrillation in her brother and mother.  Her father died at 63 secondary to suicide.  Mother died at age 82 and at heart failure, atrial fibrillation, and gout.  One brother died and had atrial fibrillation  failure.  Her sister is alive.  ROS General: Negative; No fevers, chills, or night sweats;  HEENT: Negative; No changes in vision or hearing, sinus congestion, difficulty swallowing Pulmonary: Negative; No cough, wheezing, shortness of breath, hemoptysis Cardiovascular: Positive for PAF and hyperlipidemia GI: Negative; No nausea, vomiting, diarrhea, or abdominal pain GU: Negative; No dysuria, hematuria, or difficulty voiding Musculoskeletal: Negative; no myalgias, joint pain, or weakness Hematologic/Oncology: Negative; no easy bruising, bleeding Endocrine: Negative; no heat/cold intolerance; no diabetes Neuro: Negative; no changes in balance, headaches Skin: Negative; No rashes or skin lesions Psychiatric: Negative; No behavioral problems, depression Sleep: Positive for OSA on therapy since 2013; No residual snoring, daytime sleepiness, hypersomnolence, bruxism, restless legs, hypnogognic hallucinations, no cataplexy Other comprehensive 14 point system review is negative.   PHYSICAL EXAM:   VS:  BP 104/76   Pulse 64   Ht 5' 1.5" (1.562 m)   Wt 253 lb 6.4 oz (114.9 kg)   BMI 47.10 kg/m     Wt Readings from Last 3 Encounters:  11/18/17 253 lb 6.4 oz (114.9 kg)  03/06/17 251 lb 12.8 oz (114.2 kg)  03/27/16 245 lb 3.2 oz (111.2 kg)    General: Alert, oriented, no distress.  Morbidly obese Skin: normal turgor, no rashes, warm and dry HEENT: Normocephalic, atraumatic. Pupils equal round and reactive to light; sclera anicteric; extraocular muscles intact; no hemorrhages or exudates. Nose without nasal septal hypertrophy Mouth/Parynx benign; Mallinpatti scale 3/4 Neck: No JVD, no carotid bruits; normal carotid upstroke Lungs: clear to ausculatation and percussion; no wheezing or rales Chest wall: without tenderness to palpitation Heart: PMI not displaced, RRR, s1 s2 normal, 1/6 systolic murmur, no diastolic murmur, no rubs, gallops, thrills, or heaves Abdomen: soft, nontender; no  hepatosplenomehaly, BS+; abdominal aorta nontender and not dilated by palpation. Back: no CVA tenderness Pulses 2+ Musculoskeletal: full range of motion, normal strength, no joint deformities Extremities: no clubbing cyanosis or edema, Homan's sign negative  Neurologic: grossly nonfocal; Cranial nerves grossly wnl Psychologic: Normal mood and affect   Studies/Labs Reviewed:   EKG:  EKG is ordered today.  The ekg ordered today demonstrates sinus rhythm at 64  Recent Labs: BMP Latest Ref Rng & Units 03/27/2016  Glucose 65 - 99 mg/dL 75  BUN 7 - 25 mg/dL 15  Creatinine 4.09 - 8.11 mg/dL 9.14  Sodium 782 - 956 mmol/L 140  Potassium 3.5 - 5.3 mmol/L 4.2  Chloride 98 - 110 mmol/L 104  CO2 20 - 31 mmol/L 27  Calcium 8.6 - 10.4 mg/dL 9.2     No flowsheet data found.  No flowsheet data found. No results found for: MCV No results found for: TSH  No results found for: HGBA1C   BNP No results found for: BNP  ProBNP No results found for: PROBNP   Lipid Panel  No results found for: CHOL, TRIG, HDL, CHOLHDL, VLDL, LDLCALC, LDLDIRECT   RADIOLOGY: No results found.   Additional studies/ records that were reviewed today include:  I reviewed the patient's records from Dr. Allyson Sabal.  I also reviewed patient's records from the [facility] regarding her prior sleep study.   ASSESSMENT:    1. OSA (obstructive sleep apnea)   2. Paroxysmal atrial fibrillation (HCC)   3. Pure hypercholesterolemia   4. Morbid obesity [facility])      PLAN:  Shannon Griffin is a 72 year old female who has a history of paroxysmal atrial fibrillation, morbid obesity, and has been on CPAP therapy since 2013 after she was found to have mild sleep apnea overall with moderate sleep apnea, during REM sleep.  She has continued to use CPAP with 100% compliance.  Her machine is now begun to malfunction.  She qualifies for new machine.  Since she continues to be compliant, Shannon Griffin her prescription for  a ResMed AirSense 10 CPAP machine and will continue with her current pressure at 9 cm water.  Her DME company is [employer] [facility] [employer] [facility].  Per Medicare requirements, I will see her within 90 days after she gets her new machine for further evaluation and compliance assessment.  She is morbidly obese and we discussed the importance of weight loss and increased exercise, which will benefit her sleep apnea.  She has a history of PAF and has been maintaining sinus rhythm which undoubtedly is being benefited by her treatment of her sleep apnea.  She will follow-up with Dr. Allyson Sabal for cardiology care.   Medication Adjustments/Labs and Tests Ordered: Current medicines are reviewed at length with the patient today.  Concerns regarding medicines are outlined above.  Medication changes, Labs and Tests ordered today are listed in the Patient Instructions below. Patient Instructions  Medication Instructions:  Your physician recommends that you continue on your current medications as directed. Please refer to the Current Medication list given to you today.  Follow-Up: Your physician recommends that you schedule a follow-up appointment in: 3 months with Dr. Tresa Endo (sleep clinic)   Any Other Special Instructions Will Be Listed Below (If Applicable).  Orders for new CPAP machine will be faxed to [facility].  Please let us know if you do not hear from them within 2 weeks.   If you need a refill on your cardiac medications before your next appointment, please call your pharmacy.      Signed, Nicki Guadalajara, MD  11/20/2017 6:10 PM    [facility] [address], [place], [place]  [address] Phone: [phone]

## 2017-11-25 ENCOUNTER — Telehealth: Payer: Self-pay | Admitting: *Deleted

## 2017-11-25 NOTE — Telephone Encounter (Signed)
Received fax from [employer] with questions in regards to CPAP orders faxed on 11/23/17.   Shannon JewettCalled Laura at [phone] to discuss, left message to call back.

## 2017-12-01 NOTE — Telephone Encounter (Signed)
Sherrine Mapleseached Laura at [facility]-clarified order and will be faxing over the CMN for Dr. Tresa EndoKelly to sign.

## 2017-12-21 DIAGNOSIS — Z124 Encounter for screening for malignant neoplasm of cervix: Secondary | ICD-10-CM | POA: Diagnosis not present

## 2017-12-21 DIAGNOSIS — Z01419 Encounter for gynecological examination (general) (routine) without abnormal findings: Secondary | ICD-10-CM | POA: Diagnosis not present

## 2017-12-21 DIAGNOSIS — Z1231 Encounter for screening mammogram for malignant neoplasm of breast: Secondary | ICD-10-CM | POA: Diagnosis not present

## 2018-01-19 ENCOUNTER — Telehealth: Payer: Self-pay | Admitting: Cardiovascular Disease

## 2018-01-19 NOTE — Telephone Encounter (Signed)
New message    1) What problem are you experiencing? Did not receive new cpap machine yet   Who is your medical equipment company? [employer]   Please route to the sleep study assistant.

## 2018-01-19 NOTE — Telephone Encounter (Signed)
Spoke with Vernona RiegerLaura @ [employer] [facility]/[facility].  She reviewed the patient's chart and discovered that they did receive an order back in February  for a new CPAP machine, but it seems that who ever received the records failed to realize there was a CPAP order attached to the clinic notes, filing it away in the patient's chart. Vernona RiegerLaura apologized saying that she will send the request through today. I asked if she knew how long this will take to process.  She states that she does not know. Patient was made aware of what happened and was given Laura's contact information. She was also informed that it can take up to 2 weeks from the  MDE company to hear back from the insurance company. She was instructed to contact them in 1 week to get status update if she has not heard back from them. Patient agrees with plan. I apologized for any inconvenience this might have caused her.

## 2018-01-22 DIAGNOSIS — H524 Presbyopia: Secondary | ICD-10-CM | POA: Diagnosis not present

## 2018-01-22 DIAGNOSIS — H2513 Age-related nuclear cataract, bilateral: Secondary | ICD-10-CM | POA: Diagnosis not present

## 2018-02-01 DIAGNOSIS — E039 Hypothyroidism, unspecified: Secondary | ICD-10-CM | POA: Diagnosis not present

## 2018-02-04 DIAGNOSIS — E039 Hypothyroidism, unspecified: Secondary | ICD-10-CM | POA: Diagnosis not present

## 2018-02-04 DIAGNOSIS — E042 Nontoxic multinodular goiter: Secondary | ICD-10-CM | POA: Diagnosis not present

## 2018-02-04 DIAGNOSIS — Z79890 Hormone replacement therapy (postmenopausal): Secondary | ICD-10-CM | POA: Diagnosis not present

## 2018-02-10 ENCOUNTER — Telehealth: Payer: Self-pay | Admitting: *Deleted

## 2018-02-10 NOTE — Telephone Encounter (Signed)
Faxed order for CPAP heated humidifier to [facility].

## 2018-02-16 ENCOUNTER — Ambulatory Visit: Payer: Medicare Other | Admitting: Cardiovascular Disease

## 2018-03-08 ENCOUNTER — Other Ambulatory Visit: Payer: Self-pay | Admitting: Cardiovascular Disease

## 2018-03-16 ENCOUNTER — Ambulatory Visit: Payer: Medicare Other | Admitting: Cardiovascular Disease

## 2018-03-17 ENCOUNTER — Encounter: Payer: Self-pay | Admitting: Cardiovascular Disease

## 2018-03-17 ENCOUNTER — Ambulatory Visit (INDEPENDENT_AMBULATORY_CARE_PROVIDER_SITE_OTHER): Payer: Medicare Other | Admitting: Cardiovascular Disease

## 2018-03-17 DIAGNOSIS — G4733 Obstructive sleep apnea (adult) (pediatric): Secondary | ICD-10-CM | POA: Diagnosis not present

## 2018-03-17 DIAGNOSIS — E7800 Pure hypercholesterolemia, unspecified: Secondary | ICD-10-CM | POA: Diagnosis not present

## 2018-03-17 DIAGNOSIS — I480 Paroxysmal atrial fibrillation: Secondary | ICD-10-CM | POA: Diagnosis not present

## 2018-03-17 MED ORDER — BISOPROLOL FUMARATE 5 MG PO TABS: ORAL_TABLET | ORAL | 3 refills | Status: DC

## 2018-03-17 MED ORDER — APIXABAN 5 MG PO TABS: 5.0000 mg | ORAL_TABLET | Freq: Two times a day (BID) | ORAL | 1 refills | Status: DC

## 2018-03-17 NOTE — Assessment & Plan Note (Signed)
History of obstructive sleep apnea on CPAP which she wears nightly and which she benefits from.

## 2018-03-17 NOTE — Progress Notes (Signed)
03/17/2018 Amit Meloy   Oct 16, 1946  [id number]  Primary Physician Jeanie Sewer [name], MD Primary Cardiologist: [name] Nicholes Calamity, [place]  HPI:  Shannon Griffin is a 72 y.o.  moderately overweight widowed Caucasian female mother of one daughter, grandmother and one grandchild who I last saw  03/06/2017. She relocated from [place] to be closer to her family. Her history is remarkable for paroxysmal atrial fibrillation and mild hyperlipidemia as well as obesity and obstructive sleep apnea. She wears C Pap which she benefits from.. She denies chest pain or shortness of breath. She had a 2-D echocardiogram performed in the past that showed normal LV size and function with moderate to severe left atrial enlargement. Since I saw her a year ago she's remained stable. She does work in the yard. She is unable to lose weight however. She gets occasional episodes of palpitations which may be brief though infrequent episodes of PAF currently in sinus rhythm. The CHA2DSVASC2 score is 2 . We discussed the risks and benefits of oral anticoagulation with regard to stroke prophylaxis. She is currently on Eliquis oral anticoagulation. Since I saw her year ago she's remained asymptomatic specifically denying chest pain. She does have some mild dyspnea on exertion probably multifactorial related to obesity and deconditioning Since I saw her a year ago she is remained asymptomatic.  She does not exercise because of some joint issues.  She does wear her CPAP nightly and says that she benefits from this and gets a good night sleep because of it.  She denies chest pain or shortness of breath.    Current Meds  Medication Sig  . bisoprolol (ZEBETA) 5 MG tablet TAKE ONE-HALF TABLET (2.5 MG TOTAL) EVERY MORNING AND TAKE 1 TABLET EVERY EVENING  . ELDERBERRY PO Take 1 tablet by mouth daily.  Marland Kitchen ELIQUIS 5 MG TABS tablet TAKE 1 TABLET TWICE A DAY  . KRILL OIL PO Take 1 tablet by mouth daily.  Marland Kitchen  levothyroxine (SYNTHROID, LEVOTHROID) 112 MCG tablet Take 112 mcg by mouth daily before breakfast.   . Multiple Vitamins-Minerals (MULTIVITAMIN WITH MINERALS) tablet Take 1 tablet by mouth daily.  . vitamin C (ASCORBIC ACID) 500 MG tablet Take 500 mg by mouth daily.  . vitamin E 400 UNIT capsule Take 400 Units by mouth daily.     Allergies  Allergen Reactions  . Lidocaine Swelling    Social History   Socioeconomic History  . Marital status: Widowed    Spouse name: Not on file  . Number of children: Not on file  . Years of education: Not on file  . Highest education level: Not on file  Occupational History  . Not on file  Social Needs  . Financial resource strain: Not on file  . Food insecurity:    Worry: Not on file    Inability: Not on file  . Transportation needs:    Medical: Not on file    Non-medical: Not on file  Tobacco Use  . Smoking status: Never Smoker  . Smokeless tobacco: Never Used  Substance and Sexual Activity  . Alcohol use: No  . Drug use: No  . Sexual activity: Never  Lifestyle  . Physical activity:    Days per week: Not on file    Minutes per session: Not on file  . Stress: Not on file  Relationships  . Social connections:    Talks on phone: Not on file    Gets together: Not on  file    Attends religious service: Not on file    Active member of club or organization: Not on file    Attends meetings of clubs or organizations: Not on file    Relationship status: Not on file  . Intimate partner violence:    Fear of current or ex partner: Not on file    Emotionally abused: Not on file    Physically abused: Not on file    Forced sexual activity: Not on file  Other Topics Concern  . Not on file  Social History Narrative  . Not on file     Review of Systems: General: negative for chills, fever, night sweats or weight changes.  Cardiovascular: negative for chest pain, dyspnea on exertion, edema, orthopnea, palpitations, paroxysmal nocturnal  dyspnea or shortness of breath Dermatological: negative for rash Respiratory: negative for cough or wheezing Urologic: negative for hematuria Abdominal: negative for nausea, vomiting, diarrhea, bright red blood per rectum, melena, or hematemesis Neurologic: negative for visual changes, syncope, or dizziness All other systems reviewed and are otherwise negative except as noted above.    Blood pressure 140/64, pulse (!) 57, height 5' 1.5" (1.562 m), weight 256 lb (116.1 kg).  General appearance: alert and no distress Neck: no adenopathy, no carotid bruit, no JVD, supple, symmetrical, trachea midline and thyroid not enlarged, symmetric, no tenderness/mass/nodules Lungs: clear to auscultation bilaterally Heart: regular rate and rhythm, S1, S2 normal, no murmur, click, rub or gallop Extremities: extremities normal, atraumatic, no cyanosis or edema Pulses: 2+ and symmetric Skin: Skin color, texture, turgor normal. No rashes or lesions Neurologic: Alert and oriented X 3, normal strength and tone. Normal symmetric reflexes. Normal coordination and gait  EKG not performed today  ASSESSMENT AND PLAN:   Paroxysmal atrial fibrillation History of paroxysmal atrial fibrillation maintaining sinus rhythm on Eliquis oral anticoagulation  Hyperlipidemia History of hyperlipidemia not on statin therapy at her request  Obstructive sleep apnea History of obstructive sleep apnea on CPAP which she wears nightly and which she benefits from.  Morbid obesity (HCC) History of morbid obesity with continued weight gain.      [name] FACP,FACC,FAHA, [facility] 03/17/2018 10:04 AM

## 2018-03-17 NOTE — Patient Instructions (Signed)

## 2018-03-17 NOTE — Assessment & Plan Note (Signed)
History of morbid obesity with continued weight gain.

## 2018-03-17 NOTE — Assessment & Plan Note (Signed)
History of paroxysmal atrial fibrillation maintaining sinus rhythm on Eliquis oral anticoagulation. 

## 2018-03-17 NOTE — Assessment & Plan Note (Signed)
History of hyperlipidemia not on statin therapy at her request

## 2018-06-17 ENCOUNTER — Encounter: Payer: Self-pay | Admitting: Cardiovascular Disease

## 2018-06-17 ENCOUNTER — Ambulatory Visit (INDEPENDENT_AMBULATORY_CARE_PROVIDER_SITE_OTHER): Payer: Medicare Other | Admitting: Cardiovascular Disease

## 2018-06-17 VITALS — BP 142/70 | HR 60 | Ht 61.5 in | Wt 253.6 lb

## 2018-06-17 DIAGNOSIS — I480 Paroxysmal atrial fibrillation: Secondary | ICD-10-CM | POA: Diagnosis not present

## 2018-06-17 DIAGNOSIS — G4733 Obstructive sleep apnea (adult) (pediatric): Secondary | ICD-10-CM

## 2018-06-17 NOTE — Progress Notes (Signed)
Cardiology Office Note    Date:  06/19/2018   ID:  Shannon Griffin, DOB 1946/04/11, MRN [id number]  PCP:  Noni Saupe, MD  Cardiologist:  Nicki Guadalajara, MD (sleep); Dr Allyson Sabal  Sleep clinic evaluation  History of Present Illness:  Shannon Griffin is a 72 y.o. female who presents to sleep clinic for evaluation of her obstructive sleep apnea.  Ms. Tsan is followed by Dr. Allyson Sabal for cardiology care.  She has a history of obstructive sleep apnea which was diagnosed in 2013 and had an AHI of 6.2, an RDI of 10.5, but during rim sleep sleep apnea was moderate with an HI of 23 and RDI of 37.4.  She has a history of paroxysmal atrial fibrillation.  She initiated.  CPAP at 9 cm water pressure with elimination of snoring.  She was last seen by me 5 years ago and she was compliant at her 65 m water pressure with residual AHI at 1.5.  Over the last several years, she is continued with CPAP use.  He brought her machine to the office today with her in the office.  This was interrogated.  She continues to be compliant with 100% use with average 30 day therapy at 8.24 hours.  Her AHI was 2.0.  Recently she has noticed that her CPAP machine is making significant noise and beginning to malfunction.  She has been using an AirFit PT 10 mask and is unaware of any significant leak.  Because of machine malfunction she now presents for evaluation.  Last saw her, she qualified for a new CPAP machine and received a new ResMed air sense 10 CPAP unit with a set up date on February 02, 2018.  Her DME company is [employer] [facility] in [place] and apparently [employer] so involved.  Notices significant improvement with her new machine compared to the old one.  A download was obtained from May 17, 2018 through June 15, 2018.  She is 100% compliant and averaging 8 hours and 43 minutes of sleep per night.  She is set at a pressure of 9 cm.  AHI is excellent at 0.9.  She has an ResMed AirFit P10 mask.  There is no leak.  She  denies residual daytime sleepiness.  An Epworth Sleepiness Scale score was calculated in the office today and this endorsed at 7.  Past Medical History:  Diagnosis Date  . Atrial fibrillation (HCC) 06/27/2011   Echo - EF 55-60%, LV wall thickness mildly increased; mod/severe LA dilation; estimated pulm arterial systolic pressure elevated at 08-65HQIO  . Heart murmur   . Hyperlipidemia   . Hypothyroid   . OSA on CPAP 10/25/2012   ahi 1.5    Past Surgical History:  Procedure Laterality Date  . CHOLECYSTECTOMY    . TONSILLECTOMY      Current Medications: Outpatient Medications Prior to Visit  Medication Sig Dispense Refill  . apixaban (ELIQUIS) 5 MG TABS tablet Take 1 tablet (5 mg total) by mouth 2 (two) times daily. 180 tablet 1  . bisoprolol (ZEBETA) 5 MG tablet Take 1/2 tablet (2.5 mg) by mouth every morning and 1 tablet in the evening. 45 tablet 3  . ELDERBERRY PO Take 1 tablet by mouth daily.    Marland Kitchen KRILL OIL PO Take 1 tablet by mouth daily.    Marland Kitchen levothyroxine (SYNTHROID, LEVOTHROID) 112 MCG tablet Take 112 mcg by mouth daily before breakfast.     . Multiple Vitamins-Minerals (MULTIVITAMIN WITH MINERALS) tablet Take 1 tablet by mouth  daily.    . vitamin C (ASCORBIC ACID) 500 MG tablet Take 500 mg by mouth daily.    . vitamin E 400 UNIT capsule Take 400 Units by mouth daily.     No facility-administered medications prior to visit.      Allergies:   Lidocaine   Social History   Socioeconomic History  . Marital status: Widowed    Spouse name: Not on file  . Number of children: Not on file  . Years of education: Not on file  . Highest education level: Not on file  Occupational History  . Not on file  Social Needs  . Financial resource strain: Not on file  . Food insecurity:    Worry: Not on file    Inability: Not on file  . Transportation needs:    Medical: Not on file    Non-medical: Not on file  Tobacco Use  . Smoking status: Never Smoker  . Smokeless tobacco: Never  Used  Substance and Sexual Activity  . Alcohol use: No  . Drug use: No  . Sexual activity: Never  Lifestyle  . Physical activity:    Days per week: Not on file    Minutes per session: Not on file  . Stress: Not on file  Relationships  . Social connections:    Talks on phone: Not on file    Gets together: Not on file    Attends religious service: Not on file    Active member of club or organization: Not on file    Attends meetings of clubs or organizations: Not on file    Relationship status: Not on file  Other Topics Concern  . Not on file  Social History Narrative  . Not on file     Family History:  The patient's  family history includes Atrial fibrillation in her brother and mother.  Her father died at 7059 secondary to suicide.  Mother died at age 73 and at heart failure, atrial fibrillation, and gout.  One brother died and had atrial fibrillation failure.  Her sister is alive.  ROS General: Negative; No fevers, chills, or night sweats;  HEENT: Negative; No changes in vision or hearing, sinus congestion, difficulty swallowing Pulmonary: Negative; No cough, wheezing, shortness of breath, hemoptysis Cardiovascular: Positive for PAF and hyperlipidemia GI: Negative; No nausea, vomiting, diarrhea, or abdominal pain GU: Negative; No dysuria, hematuria, or difficulty voiding Musculoskeletal: Negative; no myalgias, joint pain, or weakness Hematologic/Oncology: Negative; no easy bruising, bleeding Endocrine: Negative; no heat/cold intolerance; no diabetes Neuro: Negative; no changes in balance, headaches Skin: Negative; No rashes or skin lesions Psychiatric: Negative; No behavioral problems, depression Sleep: Positive for OSA on therapy since 2013; No residual snoring, daytime sleepiness, hypersomnolence, bruxism, restless legs, hypnogognic hallucinations, no cataplexy Other comprehensive 14 point system review is negative.   PHYSICAL EXAM:   VS:  BP (!) 142/70   Pulse 60   Ht  5' 1.5" (1.562 m)   Wt 253 lb 9.6 oz (115 kg)   SpO2 98%   BMI 47.14 kg/m     Repeat blood pressure by me was 130/70.  Wt Readings from Last 3 Encounters:  06/17/18 253 lb 9.6 oz (115 kg)  03/17/18 256 lb (116.1 kg)  11/18/17 253 lb 6.4 oz (114.9 kg)    General: Alert, oriented, no distress.  Skin: normal turgor, no rashes, warm and dry HEENT: Normocephalic, atraumatic. Pupils equal round and reactive to light; sclera anicteric; extraocular muscles intact;  Nose without nasal septal  hypertrophy Mouth/Parynx benign; Mallinpatti scale 3/4 Neck: No JVD, no carotid bruits; normal carotid upstroke Lungs: clear to ausculatation and percussion; no wheezing or rales Chest wall: without tenderness to palpitation Heart: PMI not displaced, RRR, s1 s2 normal, 1/6 systolic murmur, no diastolic murmur, no rubs, gallops, thrills, or heaves Abdomen: soft, nontender; no hepatosplenomehaly, BS+; abdominal aorta nontender and not dilated by palpation. Back: no CVA tenderness Pulses 2+ Musculoskeletal: full range of motion, normal strength, no joint deformities Extremities: no clubbing cyanosis or edema, Homan's sign negative  Neurologic: grossly nonfocal; Cranial nerves grossly wnl Psychologic: Normal mood and affect    Studies/Labs Reviewed:   EKG:  EKG is not ordered today.   11/29/2017 ECG (independently read by me): demonstrates sinus rhythm at 64  Recent Labs: BMP Latest Ref Rng & Units 03/27/2016  Glucose 65 - 99 mg/dL 75  BUN 7 - 25 mg/dL 15  Creatinine 4.09 - 8.11 mg/dL 9.14  Sodium 782 - 956 mmol/L 140  Potassium 3.5 - 5.3 mmol/L 4.2  Chloride 98 - 110 mmol/L 104  CO2 20 - 31 mmol/L 27  Calcium 8.6 - 10.4 mg/dL 9.2     No flowsheet data found.  No flowsheet data found. No results found for: MCV No results found for: TSH No results found for: HGBA1C   BNP No results found for: BNP  ProBNP No results found for: PROBNP   Lipid Panel  No results found for: CHOL,  TRIG, HDL, CHOLHDL, VLDL, LDLCALC, LDLDIRECT   RADIOLOGY: No results found.   Additional studies/ records that were reviewed today include:  I reviewed the patient's records from Dr. Allyson Sabal.  I also reviewed patient's records from the [facility] regarding her prior sleep study.   ASSESSMENT:    1. OSA (obstructive sleep apnea)   2. Paroxysmal atrial fibrillation (HCC)   3. Morbid obesity [facility])     PLAN:  Ms. Elmarie Mainland is a 72 year old female who has a history of paroxysmal atrial fibrillation, morbid obesity, and has been on CPAP therapy since 2013 after she was found to have mild sleep apnea overall with moderate sleep apnea, during REM sleep.  She has continued to use CPAP with 100% compliance.  She received a new ResMed air sense 10 CPAP machine with set up date on February 02, 2018.  Is doing exceptionally well.  AHI is excellent at her 9 cm set pressure at 0.9/h.  She is sleeping 8 hours and 43 minutes with CPAP on a daily basis and is 100% compliant.  She denies residual snoring.  There is no daytime sleepiness.  I answered all her questions.  She is doing exceptionally well.  She has a history of PAF and has been maintaining sinus rhythm.  She is morbidly obese.  Weight loss and increased exercise was recommended.  Per Medicare requirements I will see her in 1 year for reevaluation.     Medication Adjustments/Labs and Tests Ordered: Current medicines are reviewed at length with the patient today.  Concerns regarding medicines are outlined above.  Medication changes, Labs and Tests ordered today are listed in the Patient Instructions below. Patient Instructions  Your physician wants you to follow-up in: 1 year with Dr. Tresa Endo (sleep clinic). You will receive a reminder letter in the mail two months in advance. If you don't receive a letter, please call our office to schedule the follow-up appointment.     Signed, Nicki Guadalajara, MD  06/19/2018 4:26 PM    [facility] [address], [place], [place]  [address] Phone: [phone]

## 2018-06-17 NOTE — Patient Instructions (Signed)
Your physician wants you to follow-up in: 1 year with Dr. Kelly (sleep clinic). You will receive a reminder letter in the mail two months in advance. If you don't receive a letter, please call our office to schedule the follow-up appointment.  

## 2018-06-19 ENCOUNTER — Encounter: Payer: Self-pay | Admitting: Cardiovascular Disease

## 2018-09-05 ENCOUNTER — Other Ambulatory Visit: Payer: Self-pay | Admitting: Cardiovascular Disease

## 2018-09-13 ENCOUNTER — Telehealth: Payer: Self-pay | Admitting: Cardiovascular Disease

## 2018-09-13 DIAGNOSIS — I480 Paroxysmal atrial fibrillation: Secondary | ICD-10-CM

## 2018-09-13 NOTE — Telephone Encounter (Signed)
Patient is calling stating the pharmacy told her she needs blood work done in order to get a refill of apixaban (ELIQUIS) 5 MG TABS tablet.  She is confused as she has never had done this done before.

## 2018-09-13 NOTE — Telephone Encounter (Signed)
Called patient, advised of note. Gave our fax number if patient goes to PCP. Advised if they had any questions to call us and ask for Triage. Patient verbalized understanding.

## 2018-09-13 NOTE — Telephone Encounter (Signed)
We need to check her kidneys every year to re-assess she is on proper dose. If the past we received copy of blood work done at PCP office but last update was more than 1 year ago.   BMET and CBC needed for safety reasons. If completed at PCP office, please supply phone number and we can obtain copy of the results. Otherwise, please complete blood work at your earliest convenience.  Order pending in Epic.

## 2018-09-13 NOTE — Telephone Encounter (Signed)
Can you please advise if patient should have blood work done. Thank you!

## 2018-09-14 ENCOUNTER — Other Ambulatory Visit: Payer: Self-pay | Admitting: *Deleted

## 2018-09-14 DIAGNOSIS — I480 Paroxysmal atrial fibrillation: Secondary | ICD-10-CM

## 2018-09-21 DIAGNOSIS — I480 Paroxysmal atrial fibrillation: Secondary | ICD-10-CM | POA: Diagnosis not present

## 2018-09-21 LAB — BASIC METABOLIC PANEL
BUN / CREAT RATIO: 16 (ref 12–28)
BUN: 13 mg/dL (ref 8–27)
CO2: 24 mmol/L (ref 20–29)
Calcium: 9.6 mg/dL (ref 8.7–10.3)
Chloride: 102 mmol/L (ref 96–106)
Creatinine, Ser: 0.79 mg/dL (ref 0.57–1.00)
GFR calc Af Amer: 86 mL/min/{1.73_m2} (ref >59)
GFR calc non Af Amer: 74 mL/min/{1.73_m2} (ref >59)
GLUCOSE: 84 mg/dL (ref 65–99)
POTASSIUM: 4.5 mmol/L (ref 3.5–5.2)
Sodium: 141 mmol/L (ref 134–144)

## 2018-09-21 LAB — CBC
Hematocrit: 41.6 % (ref 34.0–46.6)
Hemoglobin: 13.4 g/dL (ref 11.1–15.9)
MCH: 27 pg (ref 26.6–33.0)
MCHC: 32.2 g/dL (ref 31.5–35.7)
MCV: 84 fL (ref 79–97)
Platelets: 211 10*3/uL (ref 150–450)
RBC: 4.97 x10E6/uL (ref 3.77–5.28)
RDW: 14.2 % (ref 12.3–15.4)
WBC: 8.2 10*3/uL (ref 3.4–10.8)

## 2018-09-22 ENCOUNTER — Encounter: Payer: Self-pay | Admitting: *Deleted

## 2018-09-25 ENCOUNTER — Other Ambulatory Visit: Payer: Self-pay | Admitting: Cardiovascular Disease

## 2018-10-27 DIAGNOSIS — Z2821 Immunization not carried out because of patient refusal: Secondary | ICD-10-CM | POA: Diagnosis not present

## 2018-10-27 DIAGNOSIS — Z6841 Body Mass Index (BMI) 40.0 and over, adult: Secondary | ICD-10-CM | POA: Diagnosis not present

## 2018-10-27 DIAGNOSIS — Z0000 Encounter for general adult medical examination without abnormal findings: Secondary | ICD-10-CM | POA: Diagnosis not present

## 2018-10-27 DIAGNOSIS — J329 Chronic sinusitis, unspecified: Secondary | ICD-10-CM | POA: Diagnosis not present

## 2018-11-04 DIAGNOSIS — E039 Hypothyroidism, unspecified: Secondary | ICD-10-CM | POA: Diagnosis not present

## 2018-11-04 DIAGNOSIS — E042 Nontoxic multinodular goiter: Secondary | ICD-10-CM | POA: Diagnosis not present

## 2018-11-09 DIAGNOSIS — E039 Hypothyroidism, unspecified: Secondary | ICD-10-CM | POA: Diagnosis not present

## 2018-11-09 DIAGNOSIS — E042 Nontoxic multinodular goiter: Secondary | ICD-10-CM | POA: Diagnosis not present

## 2018-12-04 ENCOUNTER — Other Ambulatory Visit: Payer: Self-pay

## 2018-12-04 ENCOUNTER — Emergency Department (HOSPITAL_COMMUNITY)
Admission: EM | Admit: 2018-12-04 | Discharge: 2018-12-05 | Disposition: A | Discharge status: Home / Self Care | Payer: Medicare Other | Attending: Emergency Medicine | Admitting: Emergency Medicine

## 2018-12-04 ENCOUNTER — Encounter (HOSPITAL_COMMUNITY): Payer: Self-pay | Admitting: Emergency Medicine

## 2018-12-04 DIAGNOSIS — I4891 Unspecified atrial fibrillation: Secondary | ICD-10-CM

## 2018-12-04 DIAGNOSIS — Z79899 Other long term (current) drug therapy: Secondary | ICD-10-CM | POA: Diagnosis not present

## 2018-12-04 DIAGNOSIS — E039 Hypothyroidism, unspecified: Secondary | ICD-10-CM | POA: Insufficient documentation

## 2018-12-04 DIAGNOSIS — I480 Paroxysmal atrial fibrillation: Secondary | ICD-10-CM | POA: Diagnosis not present

## 2018-12-04 LAB — BASIC METABOLIC PANEL
ANION GAP: 9 (ref 5–15)
BUN: 13 mg/dL (ref 8–23)
CALCIUM: 9.2 mg/dL (ref 8.9–10.3)
CO2: 26 mmol/L (ref 22–32)
Chloride: 101 mmol/L (ref 98–111)
Creatinine, Ser: 1.02 mg/dL — ABNORMAL HIGH (ref 0.44–1.00)
GFR calc Af Amer: >60 mL/min (ref >60)
GFR calc non Af Amer: 55 mL/min — ABNORMAL LOW (ref >60)
Glucose, Bld: 107 mg/dL — ABNORMAL HIGH (ref 70–99)
POTASSIUM: 3.8 mmol/L (ref 3.5–5.1)
Sodium: 136 mmol/L (ref 135–145)

## 2018-12-04 LAB — CBC
HEMATOCRIT: 49.2 % — AB (ref 36.0–46.0)
Hemoglobin: 15.1 g/dL — ABNORMAL HIGH (ref 12.0–15.0)
MCH: 26.9 pg (ref 26.0–34.0)
MCHC: 30.7 g/dL (ref 30.0–36.0)
MCV: 87.7 fL (ref 80.0–100.0)
NRBC: 0 % (ref 0.0–0.2)
Platelets: 211 10*3/uL (ref 150–400)
RBC: 5.61 MIL/uL — ABNORMAL HIGH (ref 3.87–5.11)
RDW: 15.7 % — AB (ref 11.5–15.5)
WBC: 11.3 10*3/uL — ABNORMAL HIGH (ref 4.0–10.5)

## 2018-12-04 MED ORDER — DILTIAZEM HCL 25 MG/5ML IV SOLN
20.0000 mg | Freq: Once | INTRAVENOUS | Status: AC
  Administered 2018-12-04: 20 mg via INTRAVENOUS
  Filled 2018-12-04: qty 5

## 2018-12-04 MED ORDER — ETOMIDATE 2 MG/ML IV SOLN
0.2000 mg/kg | Freq: Once | INTRAVENOUS | Status: AC
  Administered 2018-12-04: 20 mg via INTRAVENOUS
  Filled 2018-12-04: qty 20

## 2018-12-04 NOTE — ED Notes (Signed)
Patient signed consent form for cardioversion by Dr. Patria Mane under sedation , EDP explained procedure to patient  , pt. placed on a monitor and pulse oximeter , airway/crash cart set up , RT notified on procedure .

## 2018-12-04 NOTE — ED Triage Notes (Signed)
Pt c/o shortness of breath with exertion and chest ache x 3 days. Hx afib, states she feels fluttering in her chest, has not missed any med doses.

## 2018-12-04 NOTE — ED Notes (Signed)
Patient cardioverted at 150 Joules with RT/EDP/RN at bedside , converted to NSR= 68/min.

## 2018-12-05 ENCOUNTER — Emergency Department (HOSPITAL_COMMUNITY): Payer: Medicare Other

## 2018-12-05 DIAGNOSIS — I4891 Unspecified atrial fibrillation: Secondary | ICD-10-CM | POA: Diagnosis not present

## 2018-12-05 DIAGNOSIS — I480 Paroxysmal atrial fibrillation: Secondary | ICD-10-CM | POA: Diagnosis not present

## 2018-12-05 MED ORDER — ONDANSETRON HCL 4 MG/2ML IJ SOLN
4.0000 mg | Freq: Once | INTRAMUSCULAR | Status: AC
  Administered 2018-12-05: 4 mg via INTRAVENOUS
  Filled 2018-12-05: qty 2

## 2018-12-05 MED ORDER — METOCLOPRAMIDE HCL 5 MG/ML IJ SOLN
10.0000 mg | Freq: Once | INTRAMUSCULAR | Status: AC
  Administered 2018-12-05: 10 mg via INTRAVENOUS
  Filled 2018-12-05: qty 2

## 2018-12-05 NOTE — Discharge Instructions (Signed)
Please contact the atrial fibrillation clinic on Monday for close follow-up  Please keep your primary care physician and your cardiologist updated as to the events this weekend  Return to the emergency department for any new or worsening symptoms  No changes any medications will be made at this time  Continue taking her blood thinners.  Please limit your caffeine intake.

## 2018-12-05 NOTE — Sedation Documentation (Signed)
Patient alert and oriented , respirations unlabored , NSR = 62/min , denies pain .

## 2018-12-05 NOTE — ED Provider Notes (Addendum)
[facility] EMERGENCY DEPARTMENT Provider Note   CSN: [id number] Arrival date & time: 12/04/18  2118     History   Chief Complaint Chief Complaint  Patient presents with  . Atrial Fibrillation    HPI Shannon Griffin is a 73 y.o. female.  HPI 73 year old female with a history of paroxysmal atrial fibrillation who presents to the emergency department with complaints of palpitations.  She has had some mild shortness of breath.  She denies chest discomfort at this time.  She has a history of paroxysmal A. fib and is compliant with her medications including her Eliquis.  She states this is how she felt before when she was in atrial fibrillation.  No unilateral leg swelling.  No history of DVT or pulmonary embolism.  No other complaints at this time.   Past Medical History:  Diagnosis Date  . Atrial fibrillation (HCC) 06/27/2011   Echo - EF 55-60%, LV wall thickness mildly increased; mod/severe LA dilation; estimated pulm arterial systolic pressure elevated at 49-82MEBR  . Heart murmur   . Hyperlipidemia   . Hypothyroid   . OSA on CPAP 10/25/2012   ahi 1.5    Patient Active Problem List   Diagnosis Date Noted  . Paroxysmal atrial fibrillation (HCC) 02/28/2014  . Hyperlipidemia 02/28/2014  . Obstructive sleep apnea 02/28/2014  . Morbid obesity (HCC) 02/28/2014    Past Surgical History:  Procedure Laterality Date  . CHOLECYSTECTOMY    . TONSILLECTOMY       OB History   No obstetric history on file.      Home Medications    Prior to Admission medications   Medication Sig Start Date End Date Taking? Authorizing Provider  apixaban (ELIQUIS) 5 MG TABS tablet Take 1 tablet (5 mg total) by mouth 2 (two) times daily. 09/28/18  Yes Runell Gess, MD  bisoprolol (ZEBETA) 5 MG tablet Take 1/2 tablet (2.5 mg) by mouth every morning and 1 tablet in the evening. 03/17/18  Yes Runell Gess, MD  ELDERBERRY PO Take 1 tablet by mouth daily.   Yes [provider]  levothyroxine (SYNTHROID, LEVOTHROID) 112 MCG tablet Take 112 mcg by mouth daily before breakfast.    Yes [provider]  Multiple Vitamins-Minerals (MULTIVITAMIN WITH MINERALS) tablet Take 1 tablet by mouth daily.   Yes [provider]  vitamin C (ASCORBIC ACID) 500 MG tablet Take 500 mg by mouth daily.   Yes [provider]  vitamin E 400 UNIT capsule Take 400 Units by mouth daily.   Yes [provider]    Family History Family History  Problem Relation Age of Onset  . Atrial fibrillation Mother   . Atrial fibrillation Brother     Social History Social History   Tobacco Use  . Smoking status: Never Smoker  . Smokeless tobacco: Never Used  Substance Use Topics  . Alcohol use: No  . Drug use: No     Allergies   Lidocaine   Review of Systems Review of Systems  All other systems reviewed and are negative.    Physical Exam Updated Vital Signs BP 127/71   Pulse (!) 57   Resp (!) 22   Ht 5\' 1"  (1.549 m)   Wt 111.1 kg   SpO2 100%   BMI 46.29 kg/m   Physical Exam Vitals signs and nursing note reviewed.  Constitutional:      General: She is not in acute distress.    Appearance: She is well-developed.  HENT:     Head: Normocephalic and atraumatic.  Neck:     Musculoskeletal: Normal range of motion.  Cardiovascular:     Rate and Rhythm: Normal rate and regular rhythm.     Heart sounds: Normal heart sounds.  Pulmonary:     Effort: Pulmonary effort is normal.     Breath sounds: Normal breath sounds.  Abdominal:     General: There is no distension.     Palpations: Abdomen is soft.     Tenderness: There is no abdominal tenderness.  Musculoskeletal: Normal range of motion.  Skin:    General: Skin is warm and dry.  Neurological:     Mental Status: She is alert and oriented to person, place, and time.  Psychiatric:        Judgment: Judgment normal.      ED Treatments / Results  Labs (all labs ordered  are listed, but only abnormal results are displayed) Labs Reviewed  CBC - Abnormal; Notable for the following components:      Result Value   WBC 11.3 (*)    RBC 5.61 (*)    Hemoglobin 15.1 (*)    HCT 49.2 (*)    RDW 15.7 (*)    All other components within normal limits  BASIC METABOLIC PANEL - Abnormal; Notable for the following components:   Glucose, Bld 107 (*)    Creatinine, Ser 1.02 (*)    GFR calc non Af Amer 55 (*)    All other components within normal limits    EKG EKG Interpretation #1  Date/Time:  Saturday December 04 2018 21:30:12 EST Ventricular Rate:  142 PR Interval:    QRS Duration: 79 QT Interval:  309 QTC Calculation: 475 R Axis:   49 Text Interpretation:  Atrial fibrillation Repolarization abnormality, prob rate related No old tracing to compare Confirmed by Azalia Bilisampos, Shauniece Kwan ([id number]) on 12/05/2018 12:37:48 AM     EKG Interpretation #2  Date/Time:  Sunday December 05 2018 00:04:29 EST Ventricular Rate:  67 PR Interval:    QRS Duration: 79 QT Interval:  397 QTC Calculation: 420 R Axis:   36 Text Interpretation:  Sinus rhythm afib resolved Confirmed by Azalia Bilisampos, Shamika Pedregon ([id number]) on 12/05/2018 12:38:29 AM       Radiology No results found.  Procedures .Cardioversion Performed by: Azalia Bilisampos, Kamari Bilek, MD Authorized by: Azalia Bilisampos, Karla Vines, MD   Consent:    Consent obtained:  Verbal   Consent given by:  Patient   Risks discussed:  Cutaneous burn and induced arrhythmia   Alternatives discussed:  Rate-control medication and delayed treatment Pre-procedure details:    Cardioversion basis:  Elective   Rhythm:  Atrial fibrillation Patient sedated: Yes. Refer to sedation procedure documentation for details of sedation.  Attempt one:    Cardioversion mode:  Synchronous   Waveform:  Biphasic   Shock (Joules):  150   Shock outcome:  Conversion to normal sinus rhythm Post-procedure details:    Patient status:  Awake   Patient tolerance of procedure:  Tolerated well, no  immediate complications .Sedation Date/Time: 12/05/2018 12:39 AM Performed by: Azalia Bilisampos, Sharica Roedel, MD Authorized by: Azalia Bilisampos, Lashaundra Lehrmann, MD   Consent:    Consent obtained:  Verbal   Consent given by:  Patient   Risks discussed:  Allergic reaction, dysrhythmia, inadequate sedation, nausea, prolonged hypoxia resulting in organ damage, prolonged sedation necessitating reversal, respiratory compromise necessitating ventilatory assistance and intubation and vomiting   Alternatives discussed:  Analgesia without sedation, anxiolysis and regional anesthesia Universal protocol:  Procedure explained and questions answered to patient or proxy's satisfaction: yes     Relevant documents present and verified: yes     Test results available and properly labeled: yes     Imaging studies available: yes     Required blood products, implants, devices, and special equipment available: yes     Site/side marked: yes     Immediately prior to procedure a time out was called: yes     Patient identity confirmation method:  Verbally with patient Indications:    Procedure performed:  Cardioversion   Procedure necessitating sedation performed by:  Physician performing sedation Pre-sedation assessment:    Time since last food or drink:  4 hours   ASA classification: class 2 - patient with mild systemic disease     Neck mobility: normal     Mouth opening:  2 finger widths   Thyromental distance:  3 finger widths   Mallampati score:  II - soft palate, uvula, fauces visible   Pre-sedation assessments completed and reviewed: airway patency, cardiovascular function, hydration status, mental status, nausea/vomiting, pain level, respiratory function and temperature   Immediate pre-procedure details:    Reassessment: Patient reassessed immediately prior to procedure     Reviewed: vital signs, relevant labs/tests and NPO status     Verified: bag valve mask available, emergency equipment available, intubation equipment available,  IV patency confirmed, oxygen available and suction available   Procedure details (see MAR for exact dosages):    Preoxygenation:  Nasal cannula   Sedation:  Etomidate   Intra-procedure monitoring:  Blood pressure monitoring, cardiac monitor, continuous pulse oximetry, frequent LOC assessments, frequent vital sign checks and continuous capnometry   Intra-procedure events: emesis     Intra-procedure management:  Airway repositioning and airway suctioning   Total Provider sedation time (minutes):  15 Post-procedure details:    Attendance: Constant attendance by certified staff until patient recovered     Recovery: Patient returned to pre-procedure baseline     Post-sedation assessments completed and reviewed: airway patency, cardiovascular function, hydration status, mental status, nausea/vomiting, pain level, respiratory function and temperature     Patient is stable for discharge or admission: yes     Patient tolerance:  Tolerated well, no immediate complications Comments:     Tolerated procedure well. .Critical Care Performed by: Azalia Bilis, MD Authorized by: Azalia Bilis, MD   Critical care provider statement:    Critical care time (minutes):  31   Critical care was time spent personally by me on the following activities:  Discussions with consultants, evaluation of patient's response to treatment, examination of patient, ordering and performing treatments and interventions, ordering and review of laboratory studies, ordering and review of radiographic studies, pulse oximetry, re-evaluation of patient's condition, obtaining history from patient or surrogate and review of old charts   (including critical care time)  Medications Ordered in ED Medications  metoCLOPramide (REGLAN) injection 10 mg (has no administration in time range)  diltiazem (CARDIZEM) injection 20 mg (20 mg Intravenous Given 12/04/18 2143)  diltiazem (CARDIZEM) injection 20 mg (20 mg Intravenous Given 12/04/18 2232)    etomidate (AMIDATE) injection 22.22 mg (20 mg Intravenous Given 12/04/18 2348)  ondansetron (ZOFRAN) injection 4 mg (4 mg Intravenous Given 12/05/18 0004)     Initial Impression / Assessment and Plan / ED Course  I have reviewed the triage vital signs and the nursing notes.  Pertinent labs & imaging results that were available during my care of the patient were reviewed by me and  considered in my medical decision making (see chart for details).     Acute onset palpitations.  Presents with A. fib with RVR.  Initial treatment with Cardizem resulted in no significant improvement.  Patient agreed to bedside cardioversion.  Bedside cardioversion was successful.  She feels much better at this time.  Plan for outpatient cardiology follow-up.  Vital signs stable.  No hypoxia.  Some emesis postprocedure.  Antiemetics given.  Patient will need follow-up in the atrial fibrillation clinic.   This patients CHA2DS2-VASc Score and unadjusted Ischemic Stroke Rate (% per year) is equal to 2.2 % stroke rate/year from a score of 2  Above score calculated as 1 point each if present [CHF, HTN, DM, Vascular=MI/PAD/Aortic Plaque, Age if 65-74, or Female] Above score calculated as 2 points each if present [Age > 75, or Stroke/TIA/TE]     Final Clinical Impressions(s) / ED Diagnoses   Final diagnoses:  None    ED Discharge Orders         Ordered    Amb referral to AFIB Clinic     12/04/18 2323           Azalia Bilis, MD 12/05/18 4782    Azalia Bilis, MD 12/15/18 [phone]

## 2018-12-07 ENCOUNTER — Encounter (HOSPITAL_COMMUNITY): Payer: Self-pay | Admitting: Nurse Practitioner

## 2018-12-07 ENCOUNTER — Ambulatory Visit (HOSPITAL_COMMUNITY)
Admission: RE | Admit: 2018-12-07 | Discharge: 2018-12-07 | Disposition: A | Discharge status: Home / Self Care | Payer: Medicare Other | Source: Ambulatory Visit | Attending: Nurse Practitioner | Admitting: Nurse Practitioner

## 2018-12-07 VITALS — BP 132/62 | HR 55 | Ht 61.5 in | Wt 250.0 lb

## 2018-12-07 DIAGNOSIS — I480 Paroxysmal atrial fibrillation: Secondary | ICD-10-CM

## 2018-12-07 DIAGNOSIS — R011 Cardiac murmur, unspecified: Secondary | ICD-10-CM | POA: Diagnosis not present

## 2018-12-07 DIAGNOSIS — G4733 Obstructive sleep apnea (adult) (pediatric): Secondary | ICD-10-CM | POA: Insufficient documentation

## 2018-12-07 DIAGNOSIS — E785 Hyperlipidemia, unspecified: Secondary | ICD-10-CM | POA: Diagnosis not present

## 2018-12-07 DIAGNOSIS — Z6841 Body Mass Index (BMI) 40.0 and over, adult: Secondary | ICD-10-CM | POA: Diagnosis not present

## 2018-12-07 DIAGNOSIS — E669 Obesity, unspecified: Secondary | ICD-10-CM | POA: Diagnosis not present

## 2018-12-07 DIAGNOSIS — E039 Hypothyroidism, unspecified: Secondary | ICD-10-CM | POA: Diagnosis not present

## 2018-12-07 DIAGNOSIS — R001 Bradycardia, unspecified: Secondary | ICD-10-CM | POA: Insufficient documentation

## 2018-12-07 NOTE — Progress Notes (Signed)
Primary Care Physician: Noni Saupe, MD Referring Physician: [facility] ER f/u Cardiologist: Dr. Jenna Luo Shannon Griffin is a 73 y.o. female with a h/o h/o of paroxysmal afib since 2012 that has been very quiet but did require cardioversion  2/15, which was successful and she is maintaining SR today. Over the years she could just notice a few flutters very infrequently that would last seconds.She is taking apixaban and bisoprolol. She does have CPAP that she is very compliant using. She has bad knees and cannot be very active and struggles with weight loss. No tobacco, alcohol, minimal caffeine. She did have a URI several weeks ago treated with Augmentin but did not take any decongestants.Otherwise, your usual state of health.  Today, she denies symptoms of palpitations, chest pain, shortness of breath, orthopnea, PND, lower extremity edema, dizziness, presyncope, syncope, or neurologic sequela. The patient is tolerating medications without difficulties and is otherwise without complaint today.   Past Medical History:  Diagnosis Date  . Atrial fibrillation (HCC) 06/27/2011   Echo - EF 55-60%, LV wall thickness mildly increased; mod/severe LA dilation; estimated pulm arterial systolic pressure elevated at 01-02VOZD  . Heart murmur   . Hyperlipidemia   . Hypothyroid   . OSA on CPAP 10/25/2012   ahi 1.5   Past Surgical History:  Procedure Laterality Date  . CHOLECYSTECTOMY    . TONSILLECTOMY      Current Outpatient Medications  Medication Sig Dispense Refill  . apixaban (ELIQUIS) 5 MG TABS tablet Take 1 tablet (5 mg total) by mouth 2 (two) times daily. 180 tablet 1  . bisoprolol (ZEBETA) 5 MG tablet Take 1/2 tablet (2.5 mg) by mouth every morning and 1 tablet in the evening. 45 tablet 3  . ELDERBERRY PO Take 1 tablet by mouth daily.    Marland Kitchen levothyroxine (SYNTHROID, LEVOTHROID) 112 MCG tablet Take 112 mcg by mouth daily before breakfast.     . Multiple Vitamins-Minerals (MULTIVITAMIN WITH  MINERALS) tablet Take 1 tablet by mouth daily.    . vitamin C (ASCORBIC ACID) 500 MG tablet Take 500 mg by mouth daily.    . vitamin E 400 UNIT capsule Take 400 Units by mouth daily.     No current facility-administered medications for this encounter.     Allergies  Allergen Reactions  . Freeze It [Camphor-Menthol]     Histofreeze  . Lidocaine Swelling    Social History   Socioeconomic History  . Marital status: Widowed    Spouse name: Not on file  . Number of children: Not on file  . Years of education: Not on file  . Highest education level: Not on file  Occupational History  . Not on file  Social Needs  . Financial resource strain: Not on file  . Food insecurity:    Worry: Not on file    Inability: Not on file  . Transportation needs:    Medical: Not on file    Non-medical: Not on file  Tobacco Use  . Smoking status: Never Smoker  . Smokeless tobacco: Never Used  Substance and Sexual Activity  . Alcohol use: No  . Drug use: No  . Sexual activity: Never  Lifestyle  . Physical activity:    Days per week: Not on file    Minutes per session: Not on file  . Stress: Not on file  Relationships  . Social connections:    Talks on phone: Not on file    Gets together: Not on file  Attends religious service: Not on file    Active member of club or organization: Not on file    Attends meetings of clubs or organizations: Not on file    Relationship status: Not on file  . Intimate partner violence:    Fear of current or ex partner: Not on file    Emotionally abused: Not on file    Physically abused: Not on file    Forced sexual activity: Not on file  Other Topics Concern  . Not on file  Social History Narrative  . Not on file    Family History  Problem Relation Age of Onset  . Atrial fibrillation Mother   . Atrial fibrillation Brother     ROS- All systems are reviewed and negative except as per the HPI above  Physical Exam: Vitals:   12/07/18 1059  BP:  132/62  Pulse: (!) 55  Weight: 113.4 kg  Height: 5' 1.5" (1.562 m)   Wt Readings from Last 3 Encounters:  12/07/18 113.4 kg  12/04/18 111.1 kg  06/17/18 115 kg    Labs: Lab Results  Component Value Date   NA 136 12/04/2018   K 3.8 12/04/2018   CL 101 12/04/2018   CO2 26 12/04/2018   GLUCOSE 107 (H) 12/04/2018   BUN 13 12/04/2018   CREATININE 1.02 (H) 12/04/2018   CALCIUM 9.2 12/04/2018   No results found for: INR No results found for: CHOL, HDL, LDLCALC, TRIG   GEN- The patient is well appearing, alert and oriented x 3 today.   Head- normocephalic, atraumatic Eyes-  Sclera clear, conjunctiva pink Ears- hearing intact Oropharynx- clear Neck- supple, no JVP Lymph- no cervical lymphadenopathy Lungs- Clear to ausculation bilaterally, normal work of breathing Heart- Regular rate and rhythm, no murmurs, rubs or gallops, PMI not laterally displaced GI- soft, NT, ND, + BS Extremities- no clubbing, cyanosis, or edema MS- no significant deformity or atrophy Skin- no rash or lesion Psych- euthymic mood, full affect Neuro- strength and sensation are intact  EKG-Sinus bradycardia at 55 bpm, normal intervals     Assessment and Plan: 1. Paroxysmal afib  Has been very quiet since 2012, only second epiosde Successful cardioversion 2/15 No know triggers  Since afib has been so quiet, will not discuss AAD's at this point but if ERAF will discuss with pt   2. Lifestyle issues BMI 46.47 Has joint issues and has difficulty being more active Struggles with weight loss Continue cpap, states compliance, followed by Dr. Tresa Endo   3.CHA2DS2VASc score of 2 Continue eliquis 5 mg bid   Pt given contact info for the clinic so we can assist her in the future as needed for her afib issues  She has f/u with Dr. Allyson Sabal 2/21- consideration for updating echo  [facility] [address] [place], [place] [address] [phone]

## 2018-12-10 ENCOUNTER — Encounter: Payer: Self-pay | Admitting: Cardiovascular Disease

## 2018-12-10 ENCOUNTER — Ambulatory Visit (INDEPENDENT_AMBULATORY_CARE_PROVIDER_SITE_OTHER): Payer: Medicare Other | Admitting: Cardiovascular Disease

## 2018-12-10 ENCOUNTER — Telehealth: Payer: Self-pay | Admitting: Cardiovascular Disease

## 2018-12-10 DIAGNOSIS — E7800 Pure hypercholesterolemia, unspecified: Secondary | ICD-10-CM

## 2018-12-10 DIAGNOSIS — G4733 Obstructive sleep apnea (adult) (pediatric): Secondary | ICD-10-CM | POA: Diagnosis not present

## 2018-12-10 DIAGNOSIS — I480 Paroxysmal atrial fibrillation: Secondary | ICD-10-CM

## 2018-12-10 MED ORDER — BISOPROLOL FUMARATE 5 MG PO TABS: ORAL_TABLET | ORAL | 1 refills | Status: DC

## 2018-12-10 MED ORDER — BISOPROLOL FUMARATE 5 MG PO TABS: ORAL_TABLET | ORAL | 3 refills | Status: DC

## 2018-12-10 NOTE — Assessment & Plan Note (Signed)
History of PAF with episodes of Eliquis oral anticoagulation with recent episode that occurred 12/05/2018 when she presented to the ER A. fib with RVR and underwent cardioversion by Dr. Patria Mane successfully.  She feels clinically improved.  She did see Rudi Coco, NP in our A. fib clinic several days later when she was in sinus rhythm.

## 2018-12-10 NOTE — Telephone Encounter (Signed)
New message   Pt c/o medication issue:  1. Name of Medication:bisoprolol (ZEBETA) 5 MG tablet  2. How are you currently taking this medication (dosage and times per day)? 1 1/2 tablets per day  3. Are you having a reaction (difficulty breathing--STAT)?no   4. What is your medication issue? Patient would like a 90 day prescription. Please send to [facility].

## 2018-12-10 NOTE — Telephone Encounter (Signed)
Medication sent to pharmacy as requested.

## 2018-12-10 NOTE — Patient Instructions (Signed)

## 2018-12-10 NOTE — Assessment & Plan Note (Signed)
History of hyperlipidemia therapy with lipid profile performed 12/24/2015 revealing a total cholesterol 217, LDL 126 and HDL 43.

## 2018-12-10 NOTE — Assessment & Plan Note (Signed)
History of obstructive sleep apnea on CPAP. 

## 2018-12-10 NOTE — Progress Notes (Signed)
12/10/2018 Shannon Griffin   06/23/1946  [id number]  Primary Physician Jeanie Sewer [name], MD Primary Cardiologist: [name] Nicholes Calamity, [place]  HPI:  Shannon Griffin is a 73 y.o.  moderately overweight widowed Caucasian female mother of one daughter, grandmother and one grandchild who I last saw  03/17/2018. She relocated from [place] to be closer to her family. Her history is remarkable for paroxysmal atrial fibrillation and mild hyperlipidemia as well as obesity and obstructive sleep apnea. She wears C Pap which she benefits from.. She denies chest pain or shortness of breath. She had a 2-D echocardiogram performed in the past that showed normal LV size and function with moderate to severe left atrial enlargement. Since I saw her a year ago she's remained stable. She does work in the yard. She is unable to lose weight however. She gets occasional episodes of palpitations which may be brief though infrequent episodes of PAF currently in sinus rhythm. The CHA2DSVASC2 score is 2 . We discussed the risks and benefits of oral anticoagulation with regard to stroke prophylaxis.She is currently on Eliquisoral anticoagulation. Since I saw her year ago she's remained asymptomatic specifically denying chest pain. She does have some mild dyspnea on exertion probably multifactorial related to obesity and deconditioning Since I saw her 5/19 she did have an episode of A. fib with RVR brought her to the emergency room on 12/05/2018.  She underwent DC cardioversion by Dr. Patria Mane successfully back to sinus rhythm.  She saw Rudi Coco, NP in the A. fib clinic several days later in sinus rhythm.  Otherwise, she gets some dyspnea on exertion.  She denies chest pain.  She continues to wear CPAP for her sleep apnea.  She is had little success with weight loss however..   Current Meds  Medication Sig  . apixaban (ELIQUIS) 5 MG TABS tablet Take 1 tablet (5 mg total) by mouth 2 (two) times daily.    . bisoprolol (ZEBETA) 5 MG tablet Take 1/2 tablet (2.5 mg) by mouth every morning and 1 tablet in the evening.  Marland Kitchen ELDERBERRY PO Take 1 tablet by mouth daily.  Marland Kitchen levothyroxine (SYNTHROID, LEVOTHROID) 112 MCG tablet Take 112 mcg by mouth daily before breakfast.   . Multiple Vitamins-Minerals (MULTIVITAMIN WITH MINERALS) tablet Take 1 tablet by mouth daily.  . vitamin C (ASCORBIC ACID) 500 MG tablet Take 500 mg by mouth daily.  . vitamin E 400 UNIT capsule Take 400 Units by mouth daily.  . [DISCONTINUED] bisoprolol (ZEBETA) 5 MG tablet Take 1/2 tablet (2.5 mg) by mouth every morning and 1 tablet in the evening.     Allergies  Allergen Reactions  . Freeze It [Camphor-Menthol]     Histofreeze  . Lidocaine Swelling    Social History   Socioeconomic History  . Marital status: Widowed    Spouse name: Not on file  . Number of children: Not on file  . Years of education: Not on file  . Highest education level: Not on file  Occupational History  . Not on file  Social Needs  . Financial resource strain: Not on file  . Food insecurity:    Worry: Not on file    Inability: Not on file  . Transportation needs:    Medical: Not on file    Non-medical: Not on file  Tobacco Use  . Smoking status: Never Smoker  . Smokeless tobacco: Never Used  Substance and Sexual Activity  . Alcohol use: No  .  Drug use: No  . Sexual activity: Never  Lifestyle  . Physical activity:    Days per week: Not on file    Minutes per session: Not on file  . Stress: Not on file  Relationships  . Social connections:    Talks on phone: Not on file    Gets together: Not on file    Attends religious service: Not on file    Active member of club or organization: Not on file    Attends meetings of clubs or organizations: Not on file    Relationship status: Not on file  . Intimate partner violence:    Fear of current or ex partner: Not on file    Emotionally abused: Not on file    Physically abused: Not on file     Forced sexual activity: Not on file  Other Topics Concern  . Not on file  Social History Narrative  . Not on file     Review of Systems: General: negative for chills, fever, night sweats or weight changes.  Cardiovascular: negative for chest pain, dyspnea on exertion, edema, orthopnea, palpitations, paroxysmal nocturnal dyspnea or shortness of breath Dermatological: negative for rash Respiratory: negative for cough or wheezing Urologic: negative for hematuria Abdominal: negative for nausea, vomiting, diarrhea, bright red blood per rectum, melena, or hematemesis Neurologic: negative for visual changes, syncope, or dizziness All other systems reviewed and are otherwise negative except as noted above.    Blood pressure 136/78, pulse (!) 58, height 5' 1.5" (1.562 m), weight 248 lb (112.5 kg).  General appearance: alert and no distress Neck: no adenopathy, no carotid bruit, no JVD, supple, symmetrical, trachea midline and thyroid not enlarged, symmetric, no tenderness/mass/nodules Lungs: clear to auscultation bilaterally Heart: regular rate and rhythm, S1, S2 normal, no murmur, click, rub or gallop Extremities: extremities normal, atraumatic, no cyanosis or edema Pulses: 2+ and symmetric Skin: Skin color, texture, turgor normal. No rashes or lesions Neurologic: Alert and oriented X 3, normal strength and tone. Normal symmetric reflexes. Normal coordination and gait  EKG not performed today  ASSESSMENT AND PLAN:   Paroxysmal atrial fibrillation History of PAF with episodes of Eliquis oral anticoagulation with recent episode that occurred 12/05/2018 when she presented to the ER A. fib with RVR and underwent cardioversion by Dr. Patria Mane successfully.  She feels clinically improved.  She did see Rudi Coco, NP in our A. fib clinic several days later when she was in sinus rhythm.  Hyperlipidemia History of hyperlipidemia therapy with lipid profile performed 12/24/2015 revealing a total  cholesterol 217, LDL 126 and HDL 43.  Obstructive sleep apnea History of obstructive sleep apnea on CPAP.      [name] FACP,FACC,FAHA, [facility] 12/10/2018 11:12 AM

## 2018-12-16 ENCOUNTER — Other Ambulatory Visit: Payer: Self-pay | Admitting: *Deleted

## 2018-12-16 ENCOUNTER — Telehealth: Payer: Self-pay | Admitting: Cardiovascular Disease

## 2018-12-16 MED ORDER — BISOPROLOL FUMARATE 5 MG PO TABS: ORAL_TABLET | ORAL | 3 refills | Status: DC

## 2018-12-16 NOTE — Telephone Encounter (Signed)
LM NEW SCRIPT SENT IN TO [facility] .Zack Seal

## 2018-12-16 NOTE — Telephone Encounter (Signed)
Patient is calling about her bisoprolol (ZEBETA) 5 MG tablet.  1. She got the wrong amount when they called it in they only called it in for 90 pills instead for 135 as she take 1 1/2 tablet a day, it won't last her for 90 days.   2. The pharmacy change the manufacturer she wants to know if that is ok.

## 2019-03-28 ENCOUNTER — Telehealth: Payer: Self-pay | Admitting: Cardiovascular Disease

## 2019-03-28 NOTE — Telephone Encounter (Signed)
Spoke with pt. She report after eating yesterday she had an episode where she went into afib. She report her BP increased and HR went up to 120. She state after resting for a while her HR decreased to 70 and BP 117/71 but she feels she is still out of rhythm. Pt also report an achy feeling in her rib cage. Will route to PA for recommendations.

## 2019-03-28 NOTE — Telephone Encounter (Signed)
Please add patient to my schedule to be seen for EKG and also assessment. Can come today I have a late appointment open or tomorrow.

## 2019-03-28 NOTE — Telephone Encounter (Signed)
New Message    Patient c/o Palpitations:  High priority if patient c/o lightheadedness, shortness of breath, or chest pain  1) How long have you had palpitations/irregular HR/ Afib? Are you having the symptoms now?  Yesterday afternoon and settled down after three hours.  Patient states they are better now but she still doesn't feel right.  2) Are you currently experiencing lightheadedness, SOB or CP?           No  3) Do you have a history of afib (atrial fibrillation) or irregular heart rhythm? Yes  4) Have you checked your BP or HR? (document readings if available): BP-117/71  HR-70  5) Are you experiencing any other symptoms? Ache in her side.

## 2019-03-28 NOTE — Telephone Encounter (Signed)
Scheduled appt tomorrow, she is unable to come at 345 today

## 2019-03-29 ENCOUNTER — Encounter: Payer: Self-pay | Admitting: Adult Health

## 2019-03-29 ENCOUNTER — Other Ambulatory Visit: Payer: Self-pay

## 2019-03-29 ENCOUNTER — Ambulatory Visit (INDEPENDENT_AMBULATORY_CARE_PROVIDER_SITE_OTHER): Payer: Medicare Other | Admitting: Adult Health

## 2019-03-29 VITALS — BP 128/70 | HR 59 | Temp 98.2°F | Ht 60.5 in | Wt 240.6 lb

## 2019-03-29 DIAGNOSIS — Z79899 Other long term (current) drug therapy: Secondary | ICD-10-CM

## 2019-03-29 DIAGNOSIS — G4733 Obstructive sleep apnea (adult) (pediatric): Secondary | ICD-10-CM

## 2019-03-29 DIAGNOSIS — I10 Essential (primary) hypertension: Secondary | ICD-10-CM | POA: Diagnosis not present

## 2019-03-29 DIAGNOSIS — I480 Paroxysmal atrial fibrillation: Secondary | ICD-10-CM

## 2019-03-29 NOTE — Patient Instructions (Signed)
MAY TAKE EXTRA BISOPROLOL 1/2 TABLET FOR BREAKTHRU  Follow-Up: You will need a follow up appointment in 6 months.  Please call our office 2 months in advance, October 2020 to schedule this appointment.  You may see Quay Burow, MD Jory Sims, DNP, AACC  or one of the following Advanced Practice Providers on your designated Care Team:  Kerin Ransom, [place]  Roby Lofts, PA-C  Sande Rives, [place]       Medication Instructions:  NO CHANGES- Your physician recommends that you continue on your current medications as directed. Please refer to the Current Medication list given to you today. If you need a refill on your cardiac medications before your next appointment, please call your pharmacy. Labwork: When you have labs (blood work) and your tests are completely normal, you will receive your results ONLY by [place] (if you have MyChart) -OR- A paper copy in the mail.  At [facility], you and your health needs are our priority.  As part of our continuing mission to provide you with exceptional heart care, we have created designated Provider Care Teams.  These Care Teams include your primary Cardiologist (physician) and Advanced Practice Providers (APPs -  Physician Assistants and Nurse Practitioners) who all work together to provide you with the care you need, when you need it.  Thank you for choosing [facility] at [facility]!!

## 2019-03-29 NOTE — Progress Notes (Signed)
Cardiology Office Note   Date:  03/29/2019   ID:  Shannon Griffin, DOB 10-01-1946, MRN [id number]  PCP:  Angelina Sheriff, MD  Cardiologist: Dr.Berry   Chief Complaint: Paroxysmal Atrial fib    History of Present Illness: Shannon Griffin is a 73 y.o. female who presents for ongoing assessment and management of PAF, and HL. Other history includes obesity and OSA compliant with CPAP. When seen last by Dr. Gwenlyn Found on 12/10/2018 he noted that she still has brief but infrequent episodes of palpitations, which maybe infrequent episodes of PAF. She was in NSR on last visit. She has a CHADS VASC Score of 2 and is on Eliquis.   She had a recurrent episode of atrial fib with RVR and was seen in the ER on 2/16/202 where she underwent DCCV by Dr. Venora Maples, and was converted to NSR. She is followed in the Afib clinic.   The patient called our office on 03/28/2019 reporting that she had an episode of atrial fib with rate of 120 bpm. She also reported increased BP. She rested and HR settled down but she still felt it was out of rhythm. She was advised to be seen today for EKG and assessment of her status.   She states that after doing a lot of activity on this past Saturday, gardening, planting, doing heavy housework, and eating some salty foods, the next day-Sunday, she had elevated HR and BP lasting approximately 4 hours. She took her BP about once an hour. BP at its highest was 179/96 with HR 120 bpm. She felt tired and "just not right" during the elevated HR. She sat quietly and drank some cold water. This slowly subsided by  9 pm with HR down to the 70's at that point and BP 136/74. The following day, Monday, she felt normal.  She has been very strict on taking her medications as directed, and actually sets her alarm on her phone to remind her to take them. She admits to dietary non-compliance on the day before her episode eating some popcorn and chocolate after a very busy day.  Normally is very careful with salt  in her diet.    Past Medical History:  Diagnosis Date  . Atrial fibrillation ([place]) 06/27/2011   Echo - EF 55-60%, LV wall thickness mildly increased; mod/severe LA dilation; estimated pulm arterial systolic pressure elevated at 30-19mmHg  . Heart murmur   . Hyperlipidemia   . Hypothyroid   . OSA on CPAP 10/25/2012   ahi 1.5    Past Surgical History:  Procedure Laterality Date  . CHOLECYSTECTOMY    . TONSILLECTOMY       Current Outpatient Medications  Medication Sig Dispense Refill  . apixaban (ELIQUIS) 5 MG TABS tablet Take 1 tablet (5 mg total) by mouth 2 (two) times daily. 180 tablet 1  . bisoprolol (ZEBETA) 5 MG tablet Take 1/2 tablet (2.5 mg) by mouth every morning and 1 tablet in the evening. 135 tablet 3  . ELDERBERRY PO Take 1 tablet by mouth daily.    Marland Kitchen levothyroxine (SYNTHROID, LEVOTHROID) 112 MCG tablet Take 112 mcg by mouth daily before breakfast.     . Multiple Vitamins-Minerals (MULTIVITAMIN WITH MINERALS) tablet Take 1 tablet by mouth daily.    . vitamin C (ASCORBIC ACID) 500 MG tablet Take 500 mg by mouth daily.    . vitamin E 400 UNIT capsule Take 400 Units by mouth daily.     No current facility-administered medications for this visit.  Allergies:   Freeze it [camphor-menthol] and Lidocaine    Social History:  The patient  reports that she has never smoked. She has never used smokeless tobacco. She reports that she does not drink alcohol or use drugs.   Family History:  The patient's family history includes Atrial fibrillation in her brother and mother.    ROS: All other systems are reviewed and negative. Unless otherwise mentioned in H&P    PHYSICAL EXAM: VS:  BP 128/70   Pulse (!) 59   Temp 98.2 F (36.8 C)   Ht 5' 0.5" (1.537 m)   Wt 240 lb 9.6 oz (109.1 kg)   SpO2 97%   BMI 46.22 kg/m  , BMI Body mass index is 46.22 kg/m. GEN: Well nourished, well developed, in no acute distress HEENT: normal Neck: no JVD, carotid bruits, or masses  Cardiac: RRR; no murmurs, rubs, or gallops,no edema  Respiratory:  Clear to auscultation bilaterally, normal work of breathing GI: soft, nontender, nondistended, + BS MS: no deformity or atrophy Skin: warm and dry, no rash Neuro:  Strength and sensation are intact Psych: euthymic mood, full affect   EKG:  NSR with non-specific ST abnormalities. Rate of 56 bpm.  Recent Labs: 12/04/2018: BUN 13; Creatinine, Ser 1.02; Hemoglobin 15.1; Platelets 211; Potassium 3.8; Sodium 136    Lipid Panel No results found for: CHOL, TRIG, HDL, CHOLHDL, VLDL, LDLCALC, LDLDIRECT    Wt Readings from Last 3 Encounters:  03/29/19 240 lb 9.6 oz (109.1 kg)  12/10/18 248 lb (112.5 kg)  12/07/18 250 lb (113.4 kg)      Other studies Reviewed:   ASSESSMENT AND PLAN:  1. PAF: She has been doing well until 4 days ago after heavy activity with yard work and housework along with dietary indiscretion. I have advised her that if this happens again, she can take an additional 2.5 mg of bisoprolol which will help HR and BP. She is to continue to take bisoprolol 2.5 mg in the am and 5 mg in the pm otherwise. She has not had echo since 2012, would like to have this completed. She would like to wait until after the COVID level drops before having further testing.  For now I will check her BMET and TSH.   Continue apixaban 5 mg BID as directed.                 2.  Hypertension:  BP is normal at this time, but was elevated with rapid HR. She will continue salt restriction, and caffeine restriction. No changes to her medications for now.   3. OSA: She reports compliance with CPAP. States she sleeps better with it.   4. Hypothyroidism: Followed by Endocrinologist Dr. Rocky CraftsAltimer.    Current medicines are reviewed at length with the patient today.    Labs/ tests ordered today include: BMET and  TSH  Bettey MareKathryn M. Liborio NixonLawrence DNP, ANP, AACC   03/29/2019 2:11 PM    [facility] [address]  Office [phone] Fax [phone]

## 2019-03-30 LAB — BASIC METABOLIC PANEL
BUN/Creatinine Ratio: 15 (ref 12–28)
BUN: 13 mg/dL (ref 8–27)
CO2: 24 mmol/L (ref 20–29)
Calcium: 9.8 mg/dL (ref 8.7–10.3)
Chloride: 104 mmol/L (ref 96–106)
Creatinine, Ser: 0.87 mg/dL (ref 0.57–1.00)
GFR calc Af Amer: 76 mL/min/{1.73_m2} (ref >59)
GFR calc non Af Amer: 66 mL/min/{1.73_m2} (ref >59)
Glucose: 82 mg/dL (ref 65–99)
Potassium: 4.3 mmol/L (ref 3.5–5.2)
Sodium: 144 mmol/L (ref 134–144)

## 2019-03-30 LAB — TSH: TSH: 0.496 u[IU]/mL (ref 0.450–4.500)

## 2019-04-14 ENCOUNTER — Other Ambulatory Visit: Payer: Self-pay | Admitting: Cardiovascular Disease

## 2019-04-14 NOTE — Telephone Encounter (Signed)
Pt is a 3yof requesting eliquis 5mg , wt 109.1kg, scr 0.87(03/29/19), lov w/Berry(12/10/18) DXAFIB

## 2019-05-10 ENCOUNTER — Telehealth: Payer: Self-pay | Admitting: Cardiovascular Disease

## 2019-05-10 NOTE — Telephone Encounter (Signed)
New Message    Patient states she doesn't want to participate in Dr. Evette Georges sleep study appointment she wants to speak to a nurse to see if it's absolutely necessary for her to do so.

## 2019-05-20 ENCOUNTER — Other Ambulatory Visit: Payer: Self-pay

## 2019-08-04 DIAGNOSIS — E042 Nontoxic multinodular goiter: Secondary | ICD-10-CM | POA: Diagnosis not present

## 2019-08-04 DIAGNOSIS — E039 Hypothyroidism, unspecified: Secondary | ICD-10-CM | POA: Diagnosis not present

## 2019-08-11 DIAGNOSIS — E063 Autoimmune thyroiditis: Secondary | ICD-10-CM | POA: Diagnosis not present

## 2019-08-11 DIAGNOSIS — E038 Other specified hypothyroidism: Secondary | ICD-10-CM | POA: Diagnosis not present

## 2019-08-11 DIAGNOSIS — E042 Nontoxic multinodular goiter: Secondary | ICD-10-CM | POA: Diagnosis not present

## 2019-10-07 ENCOUNTER — Encounter (INDEPENDENT_AMBULATORY_CARE_PROVIDER_SITE_OTHER): Payer: Self-pay

## 2019-10-07 ENCOUNTER — Ambulatory Visit (INDEPENDENT_AMBULATORY_CARE_PROVIDER_SITE_OTHER): Payer: Medicare Other | Admitting: Cardiovascular Disease

## 2019-10-07 ENCOUNTER — Encounter: Payer: Self-pay | Admitting: Cardiovascular Disease

## 2019-10-07 ENCOUNTER — Other Ambulatory Visit: Payer: Self-pay

## 2019-10-07 VITALS — BP 122/82 | HR 58 | Temp 97.3°F | Ht 60.5 in | Wt 234.0 lb

## 2019-10-07 DIAGNOSIS — E782 Mixed hyperlipidemia: Secondary | ICD-10-CM | POA: Diagnosis not present

## 2019-10-07 DIAGNOSIS — I480 Paroxysmal atrial fibrillation: Secondary | ICD-10-CM

## 2019-10-07 DIAGNOSIS — G4733 Obstructive sleep apnea (adult) (pediatric): Secondary | ICD-10-CM

## 2019-10-07 DIAGNOSIS — Z8249 Family history of ischemic heart disease and other diseases of the circulatory system: Secondary | ICD-10-CM | POA: Diagnosis not present

## 2019-10-07 NOTE — Assessment & Plan Note (Signed)
History of hyperlipidemia not on statin therapy.  We'll recheck a lipid liver profile

## 2019-10-07 NOTE — Assessment & Plan Note (Signed)
History of PAF maintaining sinus rhythm on Eliquis oral anticoagulation.  She did have a episode in June and saw Jory Sims, NP for this and took an extra half of bisoprolol which resolved the issue.

## 2019-10-07 NOTE — Assessment & Plan Note (Signed)
History of obstructive sleep apnea on CPAP. 

## 2019-10-07 NOTE — Patient Instructions (Addendum)
Medication Instructions:  Your physician recommends that you continue on your current medications as directed. Please refer to the Current Medication list given to you today. *If you need a refill on your cardiac medications before your next appointment, please call your pharmacy*  Lab Work: Your physician recommends that you return for lab work   FASTING LIPID PANEL-DO NOT EAT OR DRINK PAST MIDNIGHT  LIVER FUNCTION TEST  If you have labs (blood work) drawn today and your tests are completely normal, you will receive your results only by: Marland Kitchen MyChart Message (if you have MyChart) OR . A paper copy in the mail If you have any lab test that is abnormal or we need to change your treatment, we will call you to review the results.  Testing/Procedures: YOUR PHYSICIAN HAS REQUESTED YOU TO HAVE A CALCIUM SCORE  PLEASE SCHEDULE FOR 2-3 WEEKS  Follow-Up: At [facility], you and your health needs are our priority.  As part of our continuing mission to provide you with exceptional heart care, we have created designated Provider Care Teams.  These Care Teams include your primary Cardiologist (physician) and Advanced Practice Providers (APPs -  Physician Assistants and Nurse Practitioners) who all work together to provide you with the care you need, when you need it.  Your next appointment:   12 month(s)  The format for your next appointment:   In Person  Provider:   Quay Burow, MD  Other Instructions

## 2019-10-07 NOTE — Progress Notes (Signed)
10/07/2019 Shannon Griffin   12/30/1945  [id number]  Primary Physician Lin Landsman Angelique Blonder, MD Primary Cardiologist: Lorretta Harp MD Lupe Carney, [place]  HPI:  [name] is a 73 y.o.  moderately overweight widowed Caucasian female mother of one daughter, grandmother and one grandchild who I last saw  12/10/2018. She relocated from [place] to be closer to her family. Her history is remarkable for paroxysmal atrial fibrillation and mild hyperlipidemia as well as obesity and obstructive sleep apnea. She wears C Pap which she benefits from.. She denies chest pain or shortness of breath. She had a 2-D echocardiogram performed in the past that showed normal LV size and function with moderate to severe left atrial enlargement. Since I saw her a year ago she's remained stable. She does work in the yard. She is unable to lose weight however. She gets occasional episodes of palpitations which may be brief though infrequent episodes of PAF currently in sinus rhythm. The CHA2DSVASC2 score is 2 . We discussed the risks and benefits of oral anticoagulation with regard to stroke prophylaxis.She is currently on Eliquisoral anticoagulation. Since I saw her year ago she's remained asymptomatic specifically denying chest pain. She does have some mild dyspnea on exertion probably multifactorial related to obesity and deconditioning  She did have an episode of A. fib with RVR brought her to the emergency room on 12/05/2018.  She underwent DC cardioversion by Dr. Venora Maples successfully back to sinus rhythm.  She saw Roderic Palau, NP in the A. fib clinic several days later in sinus rhythm.  Otherwise, she gets some dyspnea on exertion.  She denies chest pain.  She continues to wear CPAP for her sleep apnea.  She is had little success with weight loss however.  Since I saw her 10 months ago she did see Jory Sims in the office 03/29/2019 for an episode of PAF.  This resolved fairly quickly with taking a  half of the bisoprolol.  She complains of some atypical chest pain which does not sound anginal as well as chronic dyspnea.  She does have a 2D echo that was performed in the past which was normal.    Current Meds  Medication Sig  . bisoprolol (ZEBETA) 5 MG tablet Take 1/2 tablet (2.5 mg) by mouth every morning and 1 tablet in the evening.  Marland Kitchen ELDERBERRY PO Take 1 tablet by mouth daily.  Marland Kitchen ELIQUIS 5 MG TABS tablet TAKE 1 TABLET TWICE A DAY  . levothyroxine (SYNTHROID, LEVOTHROID) 112 MCG tablet Take 112 mcg by mouth daily before breakfast.   . Multiple Vitamins-Minerals (MULTIVITAMIN WITH MINERALS) tablet Take 1 tablet by mouth daily.  . vitamin C (ASCORBIC ACID) 500 MG tablet Take 500 mg by mouth daily.  . vitamin E 400 UNIT capsule Take 400 Units by mouth daily.     Allergies  Allergen Reactions  . Freeze It [Camphor-Menthol]     Histofreeze  . Lidocaine Swelling    Social History   Socioeconomic History  . Marital status: Widowed    Spouse name: Not on file  . Number of children: Not on file  . Years of education: Not on file  . Highest education level: Not on file  Occupational History  . Not on file  Tobacco Use  . Smoking status: Never Smoker  . Smokeless tobacco: Never Used  Substance and Sexual Activity  . Alcohol use: No  . Drug use: No  . Sexual activity: Never  Other Topics Concern  .  Not on file  Social History Narrative  . Not on file   Social Determinants of Health   Financial Resource Strain:   . Difficulty of Paying Living Expenses: Not on file  Food Insecurity:   . Worried About Programme researcher, broadcasting/film/video in the Last Year: Not on file  . Ran Out of Food in the Last Year: Not on file  Transportation Needs:   . Lack of Transportation (Medical): Not on file  . Lack of Transportation (Non-Medical): Not on file  Physical Activity:   . Days of Exercise per Week: Not on file  . Minutes of Exercise per Session: Not on file  Stress:   . Feeling of Stress :  Not on file  Social Connections:   . Frequency of Communication with Friends and Family: Not on file  . Frequency of Social Gatherings with Friends and Family: Not on file  . Attends Religious Services: Not on file  . Active Member of Clubs or Organizations: Not on file  . Attends Banker Meetings: Not on file  . Marital Status: Not on file  Intimate Partner Violence:   . Fear of Current or Ex-Partner: Not on file  . Emotionally Abused: Not on file  . Physically Abused: Not on file  . Sexually Abused: Not on file     Review of Systems: General: negative for chills, fever, night sweats or weight changes.  Cardiovascular: negative for chest pain, dyspnea on exertion, edema, orthopnea, palpitations, paroxysmal nocturnal dyspnea or shortness of breath Dermatological: negative for rash Respiratory: negative for cough or wheezing Urologic: negative for hematuria Abdominal: negative for nausea, vomiting, diarrhea, bright red blood per rectum, melena, or hematemesis Neurologic: negative for visual changes, syncope, or dizziness All other systems reviewed and are otherwise negative except as noted above.    Blood pressure 122/82, pulse (!) 58, temperature (!) 97.3 F (36.3 C), height 5' 0.5" (1.537 m), weight 234 lb (106.1 kg), SpO2 100 %.  General appearance: alert and no distress Neck: no adenopathy, no carotid bruit, no JVD, supple, symmetrical, trachea midline and thyroid not enlarged, symmetric, no tenderness/mass/nodules Lungs: clear to auscultation bilaterally Heart: regular rate and rhythm, S1, S2 normal, no murmur, click, rub or gallop Extremities: extremities normal, atraumatic, no cyanosis or edema Pulses: 2+ and symmetric Skin: Skin color, texture, turgor normal. No rashes or lesions Neurologic: Alert and oriented X 3, normal strength and tone. Normal symmetric reflexes. Normal coordination and gait  EKG sinus bradycardia 58 with nonspecific ST and T wave  changes.  I personally reviewed this EKG.  ASSESSMENT AND PLAN:   Paroxysmal atrial fibrillation History of PAF maintaining sinus rhythm on Eliquis oral anticoagulation.  She did have a episode in June and saw Joni Reining, NP for this and took an extra half of bisoprolol which resolved the issue.  Hyperlipidemia History of hyperlipidemia not on statin therapy.  We'll recheck a lipid liver profile  Obstructive sleep apnea History of obstructive sleep apnea on CPAP      [name] [facility], [facility] 10/07/2019 11:41 AM

## 2019-10-11 DIAGNOSIS — E782 Mixed hyperlipidemia: Secondary | ICD-10-CM | POA: Diagnosis not present

## 2019-10-12 ENCOUNTER — Other Ambulatory Visit: Payer: Self-pay

## 2019-10-12 DIAGNOSIS — E785 Hyperlipidemia, unspecified: Secondary | ICD-10-CM

## 2019-10-12 LAB — HEPATIC FUNCTION PANEL
ALT: 10 IU/L (ref 0–32)
AST: 12 IU/L (ref 0–40)
Albumin: 4 g/dL (ref 3.7–4.7)
Alkaline Phosphatase: 80 IU/L (ref 39–117)
Bilirubin Total: 0.6 mg/dL (ref 0.0–1.2)
Bilirubin, Direct: 0.13 mg/dL (ref 0.00–0.40)
Total Protein: 6.1 g/dL (ref 6.0–8.5)

## 2019-10-12 LAB — LIPID PANEL
Chol/HDL Ratio: 4.5 ratio — ABNORMAL HIGH (ref 0.0–4.4)
Cholesterol, Total: 207 mg/dL — ABNORMAL HIGH (ref 100–199)
HDL: 46 mg/dL (ref >39)
LDL Chol Calc (NIH): 135 mg/dL — ABNORMAL HIGH (ref 0–99)
Triglycerides: 144 mg/dL (ref 0–149)
VLDL Cholesterol Cal: 26 mg/dL (ref 5–40)

## 2019-10-26 ENCOUNTER — Ambulatory Visit (INDEPENDENT_AMBULATORY_CARE_PROVIDER_SITE_OTHER)
Admission: RE | Admit: 2019-10-26 | Discharge: 2019-10-26 | Disposition: A | Discharge status: Home / Self Care | Payer: Self-pay | Source: Ambulatory Visit | Attending: Cardiovascular Disease | Admitting: Cardiovascular Disease

## 2019-10-26 ENCOUNTER — Other Ambulatory Visit: Payer: Self-pay

## 2019-10-26 DIAGNOSIS — Z8249 Family history of ischemic heart disease and other diseases of the circulatory system: Secondary | ICD-10-CM

## 2019-10-27 ENCOUNTER — Telehealth: Payer: Self-pay | Admitting: Cardiovascular Disease

## 2019-10-27 NOTE — Telephone Encounter (Signed)
Patient called with calcium score results - unsure what else needed to be reviewed. She had FLP in Dec 2020 and MD suggested dietary changed & repeat in 2 months. She is working on her diet and was advised to check FLP in Feb 2021. She would like lab order (in system) mailed to her so she can go to [facility] in [place]. Routed to primary nurse   Runell Gess, MD  10/26/2019 4:04 PM EST    Elevated Cor CA Score. Please get a Fasting lipid level and then we can decide on statin Rx

## 2019-10-27 NOTE — Telephone Encounter (Signed)
New message:    Patient states she received a phone call on yesterday. Patient returning a call. I did not see a note. Please call back.

## 2019-10-31 ENCOUNTER — Other Ambulatory Visit: Payer: Self-pay | Admitting: Cardiovascular Disease

## 2019-11-14 ENCOUNTER — Other Ambulatory Visit: Payer: Self-pay

## 2019-11-14 MED ORDER — APIXABAN 5 MG PO TABS: 5.0000 mg | ORAL_TABLET | Freq: Two times a day (BID) | ORAL | 1 refills | Status: DC

## 2019-11-22 ENCOUNTER — Other Ambulatory Visit: Payer: Self-pay | Admitting: Cardiovascular Disease

## 2019-11-22 MED ORDER — APIXABAN 5 MG PO TABS: 5.0000 mg | ORAL_TABLET | Freq: Two times a day (BID) | ORAL | 1 refills | Status: DC

## 2019-11-22 MED ORDER — BISOPROLOL FUMARATE 5 MG PO TABS: ORAL_TABLET | ORAL | 2 refills | Status: DC

## 2019-11-22 NOTE — Telephone Encounter (Signed)
*  STAT* If patient is at the pharmacy, call can be transferred to refill team.   1. Which medications need to be refilled? (please list name of each medication and dose if known)  apixaban (ELIQUIS) 5 MG TABS tablet bisoprolol (ZEBETA) 5 MG tablet (manufactered by [employer])  2. Which pharmacy/location (including street and city if local pharmacy) is medication to be sent to? [facility] - [place], [place] - [address]  3. Do they need a 30 day or 90 day supply? 90   Patient states prescriptions should be marked auto refill and auto renewal

## 2020-01-31 ENCOUNTER — Other Ambulatory Visit: Payer: Self-pay

## 2020-01-31 DIAGNOSIS — E782 Mixed hyperlipidemia: Secondary | ICD-10-CM

## 2020-01-31 DIAGNOSIS — Z79899 Other long term (current) drug therapy: Secondary | ICD-10-CM

## 2020-02-08 DIAGNOSIS — E042 Nontoxic multinodular goiter: Secondary | ICD-10-CM | POA: Diagnosis not present

## 2020-02-08 DIAGNOSIS — E039 Hypothyroidism, unspecified: Secondary | ICD-10-CM | POA: Diagnosis not present

## 2020-02-10 DIAGNOSIS — E039 Hypothyroidism, unspecified: Secondary | ICD-10-CM | POA: Diagnosis not present

## 2020-02-10 DIAGNOSIS — E063 Autoimmune thyroiditis: Secondary | ICD-10-CM | POA: Insufficient documentation

## 2020-02-10 DIAGNOSIS — E042 Nontoxic multinodular goiter: Secondary | ICD-10-CM | POA: Diagnosis not present

## 2020-03-06 ENCOUNTER — Ambulatory Visit (INDEPENDENT_AMBULATORY_CARE_PROVIDER_SITE_OTHER): Payer: Medicare Other | Admitting: Cardiovascular Disease

## 2020-03-06 ENCOUNTER — Other Ambulatory Visit: Payer: Self-pay

## 2020-03-06 ENCOUNTER — Encounter: Payer: Self-pay | Admitting: Cardiovascular Disease

## 2020-03-06 VITALS — BP 108/66 | HR 63 | Temp 97.4°F | Ht 61.5 in | Wt 240.0 lb

## 2020-03-06 DIAGNOSIS — I480 Paroxysmal atrial fibrillation: Secondary | ICD-10-CM

## 2020-03-06 DIAGNOSIS — E782 Mixed hyperlipidemia: Secondary | ICD-10-CM | POA: Diagnosis not present

## 2020-03-06 DIAGNOSIS — G4733 Obstructive sleep apnea (adult) (pediatric): Secondary | ICD-10-CM | POA: Diagnosis not present

## 2020-03-06 NOTE — Progress Notes (Signed)
03/06/2020 Shannon Griffin   November 06, 1945  [id number]  Primary Physician Lin Landsman Angelique Blonder, MD Primary Cardiologist: Lorretta Harp MD Garret Reddish, [place], [place]  HPI:  Shannon Griffin is a 74 y.o.  moderately overweight widowed Caucasian female mother of one daughter, grandmother and one grandchild who I last saw  10/07/2019. She relocated from [place] to be closer to her family. Her history is remarkable for paroxysmal atrial fibrillation and mild hyperlipidemia as well as obesity and obstructive sleep apnea. She wears C Pap which she benefits from.. She denies chest pain or shortness of breath. She had a 2-D echocardiogram performed in the past that showed normal LV size and function with moderate to severe left atrial enlargement. Since I saw her a year ago she's remained stable. She does work in the yard. She is unable to lose weight however. She gets occasional episodes of palpitations which may be brief though infrequent episodes of PAF currently in sinus rhythm. The CHA2DSVASC2 score is 2 . We discussed the risks and benefits of oral anticoagulation with regard to stroke prophylaxis.She is currently on Eliquisoral anticoagulation. Since I saw her year ago she's remained asymptomatic specifically denying chest pain. She does have some mild dyspnea on exertion probably multifactorial related to obesity and deconditioning  She did have an episode of A. fib with RVR brought her to the emergency room on 12/05/2018. She underwent DC cardioversion by Dr. Venora Maples successfully back to sinus rhythm. She saw Roderic Palau, NP in the A. fib clinic several days later in sinus rhythm. Otherwise, she gets some dyspnea on exertion. She denies chest pain. She continues to wear CPAP for her sleep apnea. She is had little success with weight loss however.  Since I saw her in the office 5 months ago she continues to do well.  She has occasional episodes of PAF with which resolve quickly and  spontaneously.  She really has been unsuccessful with weight loss.  She does wear her CPAP.  She denies chest pain or shortness of breath but does complain of daytime somnolence.  She had a lipid profile performed 10/11/2019 that revealed total cholesterol 207 with an LDL of 135.  She is resistant to starting statin therapy.  I am going to refer her to Dr. Debara Pickett.  Since I saw her 10 months ago she did see Jory Sims in the office 03/29/2019 for an episode of PAF.  This resolved fairly quickly with taking a half of the bisoprolol.  She complains of some atypical chest pain which does not sound anginal as well as chronic dyspnea.  She does have a 2D echo that was performed in the past which was normal.   Current Meds  Medication Sig  . apixaban (ELIQUIS) 5 MG TABS tablet Take 1 tablet (5 mg total) by mouth 2 (two) times daily.  . bisoprolol (ZEBETA) 5 MG tablet Take 1/2 tablet (2.5 mg) by mouth every morning and 1 tablet in the evening.  Marland Kitchen ELDERBERRY PO Take 1 tablet by mouth daily.  Marland Kitchen levothyroxine (SYNTHROID, LEVOTHROID) 112 MCG tablet Take 112 mcg by mouth daily before breakfast. Take 1 tablet 6 days a week and half tablet on 7th day  . Multiple Vitamins-Minerals (MULTIVITAMIN WITH MINERALS) tablet Take 1 tablet by mouth daily.  . vitamin C (ASCORBIC ACID) 500 MG tablet Take 500 mg by mouth daily.  . vitamin E 400 UNIT capsule Take 400 Units by mouth daily.     Allergies  Allergen Reactions  .  Freeze It [Camphor-Menthol]     Histofreeze  . Lidocaine Swelling    Social History   Socioeconomic History  . Marital status: Widowed    Spouse name: Not on file  . Number of children: Not on file  . Years of education: Not on file  . Highest education level: Not on file  Occupational History  . Not on file  Tobacco Use  . Smoking status: Never Smoker  . Smokeless tobacco: Never Used  Substance and Sexual Activity  . Alcohol use: No  . Drug use: No  . Sexual activity: Never  Other  Topics Concern  . Not on file  Social History Narrative  . Not on file   Social Determinants of Health   Financial Resource Strain:   . Difficulty of Paying Living Expenses:   Food Insecurity:   . Worried About Programme researcher, broadcasting/film/video in the Last Year:   . Barista in the Last Year:   Transportation Needs:   . Freight forwarder (Medical):   Marland Kitchen Lack of Transportation (Non-Medical):   Physical Activity:   . Days of Exercise per Week:   . Minutes of Exercise per Session:   Stress:   . Feeling of Stress :   Social Connections:   . Frequency of Communication with Friends and Family:   . Frequency of Social Gatherings with Friends and Family:   . Attends Religious Services:   . Active Member of Clubs or Organizations:   . Attends Banker Meetings:   Marland Kitchen Marital Status:   Intimate Partner Violence:   . Fear of Current or Ex-Partner:   . Emotionally Abused:   Marland Kitchen Physically Abused:   . Sexually Abused:      Review of Systems: General: negative for chills, fever, night sweats or weight changes.  Cardiovascular: negative for chest pain, dyspnea on exertion, edema, orthopnea, palpitations, paroxysmal nocturnal dyspnea or shortness of breath Dermatological: negative for rash Respiratory: negative for cough or wheezing Urologic: negative for hematuria Abdominal: negative for nausea, vomiting, diarrhea, bright red blood per rectum, melena, or hematemesis Neurologic: negative for visual changes, syncope, or dizziness All other systems reviewed and are otherwise negative except as noted above.    Blood pressure 108/66, pulse 63, temperature (!) 97.4 F (36.3 C), height 5' 1.5" (1.562 m), weight 240 lb (108.9 kg).  General appearance: alert and no distress Neck: no adenopathy, no carotid bruit, no JVD, supple, symmetrical, trachea midline and thyroid not enlarged, symmetric, no tenderness/mass/nodules Lungs: clear to auscultation bilaterally Heart: regular rate and  rhythm, S1, S2 normal, no murmur, click, rub or gallop Extremities: extremities normal, atraumatic, no cyanosis or edema Pulses: 2+ and symmetric Skin: Skin color, texture, turgor normal. No rashes or lesions Neurologic: Alert and oriented X 3, normal strength and tone. Normal symmetric reflexes. Normal coordination and gait  EKG this rhythm at 63 without ST or T wave changes.  Personally reviewed this EKG.  ASSESSMENT AND PLAN:   Paroxysmal atrial fibrillation History of paroxysmal A. fib maintaining sinus rhythm on bisoprolol and Eliquis  Hyperlipidemia History of hyperlipidemia resistant to statin therapy with lipid profile performed 10/11/2019 with a total cholesterol 207 and LDL of 135.  She is resistant to using statin therapy.  Referred her to Dr. Rennis Golden for nonstatin alternatives.  Obstructive sleep apnea History of obstructive sleep apnea on CPAP which she uses religiously      [name] [facility], [facility] 03/06/2020 10:55 AM

## 2020-03-06 NOTE — Assessment & Plan Note (Signed)
History of hyperlipidemia resistant to statin therapy with lipid profile performed 10/11/2019 with a total cholesterol 207 and LDL of 135.  She is resistant to using statin therapy.  Referred her to Dr. Rennis Golden for nonstatin alternatives.

## 2020-03-06 NOTE — Patient Instructions (Addendum)
Medication Instructions:  Your physician recommends that you continue on your current medications as directed. Please refer to the Current Medication list given to you today.  *If you need a refill on your cardiac medications before your next appointment, please call your pharmacy*   Lab Work: Please return for FASTING labs  (Lipid, Hepatic)  Our in office lab hours are Monday-Friday 8:00-4:00, closed for lunch 12:45-1:45 pm.  No appointment needed.  If you have labs (blood work) drawn today and your tests are completely normal, you will receive your results only by: Marland Kitchen MyChart Message (if you have MyChart) OR . A paper copy in the mail If you have any lab test that is abnormal or we need to change your treatment, we will call you to review the results.   Testing/Procedures: Your physician has requested that you have an echocardiogram. Echocardiography is a painless test that uses sound waves to create images of your heart. It provides your doctor with information about the size and shape of your heart and how well your heart's chambers and valves are working. This procedure takes approximately one hour. There are no restrictions for this procedure. This will be done at our [facility] location:  [address]  Follow-Up: At [facility], you and your health needs are our priority.  As part of our continuing mission to provide you with exceptional heart care, we have created designated Provider Care Teams.  These Care Teams include your primary Cardiologist (physician) and Advanced Practice Providers (APPs -  Physician Assistants and Nurse Practitioners) who all work together to provide you with the care you need, when you need it.  We recommend signing up for the patient portal called "MyChart".  Sign up information is provided on this After Visit Summary.  MyChart is used to connect with patients for Virtual Visits (Telemedicine).  Patients are able to view lab/test  results, encounter notes, upcoming appointments, etc.  Non-urgent messages can be sent to your provider as well.   To learn more about what you can do with MyChart, go to [email or web address].    Your next appointment:   12 month(s)  The format for your next appointment:   In Person  Provider:   Nanetta Batty, MD   Other Instructions You have been referred to Dr. Rennis Golden for cholesterol managment     Heart-Healthy Eating Plan Heart-healthy meal planning includes:  Eating less unhealthy fats.  Eating more healthy fats.  Making other changes in your diet. Talk with your doctor or a diet specialist (dietitian) to create an eating plan that is right for you. What are tips for following this plan? Cooking Avoid frying your food. Try to bake, boil, grill, or broil it instead. You can also reduce fat by:  Removing the skin from poultry.  Removing all visible fats from meats.  Steaming vegetables in water or broth. Meal planning   At meals, divide your plate into four equal parts: ? Fill one-half of your plate with vegetables and green salads. ? Fill one-fourth of your plate with whole grains. ? Fill one-fourth of your plate with lean protein foods.  Eat 4-5 servings of vegetables per day. A serving of vegetables is: ? 1 cup of raw or cooked vegetables. ? 2 cups of raw leafy greens.  Eat 4-5 servings of fruit per day. A serving of fruit is: ? 1 medium whole fruit. ?  cup of dried fruit. ?  cup of fresh, frozen, or canned fruit. ?  cup of 100% fruit juice.  Eat more foods that have soluble fiber. These are apples, broccoli, carrots, beans, peas, and barley. Try to get 20-30 g of fiber per day.  Eat 4-5 servings of nuts, legumes, and seeds per week: ? 1 serving of dried beans or legumes equals  cup after being cooked. ? 1 serving of nuts is  cup. ? 1 serving of seeds equals 1 tablespoon. General information  Eat more home-cooked food. Eat less restaurant,  buffet, and fast food.  Limit or avoid alcohol.  Limit foods that are high in starch and sugar.  Avoid fried foods.  Lose weight if you are overweight.  Keep track of how much salt (sodium) you eat. This is important if you have high blood pressure. Ask your doctor to tell you more about this.  Try to add vegetarian meals each week. Fats  Choose healthy fats. These include olive oil and canola oil, flaxseeds, walnuts, almonds, and seeds.  Eat more omega-3 fats. These include salmon, mackerel, sardines, tuna, flaxseed oil, and ground flaxseeds. Try to eat fish at least 2 times each week.  Check food labels. Avoid foods with trans fats or high amounts of saturated fat.  Limit saturated fats. ? These are often found in animal products, such as meats, butter, and cream. ? These are also found in plant foods, such as palm oil, palm kernel oil, and coconut oil.  Avoid foods with partially hydrogenated oils in them. These have trans fats. Examples are stick margarine, some tub margarines, cookies, crackers, and other baked goods. What foods can I eat? Fruits All fresh, canned (in natural juice), or frozen fruits. Vegetables Fresh or frozen vegetables (raw, steamed, roasted, or grilled). Green salads. Grains Most grains. Choose whole wheat and whole grains most of the time. Rice and pasta, including brown rice and pastas made with whole wheat. Meats and other proteins Lean, well-trimmed beef, veal, pork, and lamb. Chicken and [place] without skin. All fish and shellfish. Wild duck, rabbit, pheasant, and venison. Egg whites or low-cholesterol egg substitutes. Dried beans, peas, lentils, and tofu. Seeds and most nuts. Dairy Low-fat or nonfat cheeses, including ricotta and mozzarella. Skim or 1% milk that is liquid, powdered, or evaporated. Buttermilk that is made with low-fat milk. Nonfat or low-fat yogurt. Fats and oils Non-hydrogenated (trans-free) margarines. Vegetable oils, including  soybean, sesame, sunflower, olive, peanut, safflower, corn, canola, and cottonseed. Salad dressings or mayonnaise made with a vegetable oil. Beverages Mineral water. Coffee and tea. Diet carbonated beverages. Sweets and desserts Sherbet, gelatin, and fruit ice. Small amounts of dark chocolate. Limit all sweets and desserts. Seasonings and condiments All seasonings and condiments. The items listed above may not be a complete list of foods and drinks you can eat. Contact a dietitian for more options. What foods should I avoid? Fruits Canned fruit in heavy syrup. Fruit in cream or butter sauce. Fried fruit. Limit coconut. Vegetables Vegetables cooked in cheese, cream, or butter sauce. Fried vegetables. Grains Breads that are made with saturated or trans fats, oils, or whole milk. Croissants. Sweet rolls. Donuts. High-fat crackers, such as cheese crackers. Meats and other proteins Fatty meats, such as hot dogs, ribs, sausage, bacon, rib-eye roast or steak. High-fat deli meats, such as salami and bologna. Caviar. Domestic duck and goose. Organ meats, such as liver. Dairy Cream, sour cream, cream cheese, and creamed cottage cheese. Whole-milk cheeses. Whole or 2% milk that is liquid, evaporated, or condensed. Whole buttermilk. Cream sauce or high-fat cheese sauce.  Yogurt that is made from whole milk. Fats and oils Meat fat, or shortening. Cocoa butter, hydrogenated oils, palm oil, coconut oil, palm kernel oil. Solid fats and shortenings, including bacon fat, salt pork, lard, and butter. Nondairy cream substitutes. Salad dressings with cheese or sour cream. Beverages Regular sodas and juice drinks with added sugar. Sweets and desserts Frosting. Pudding. Cookies. Cakes. Pies. Milk chocolate or white chocolate. Buttered syrups. Full-fat ice cream or ice cream drinks. The items listed above may not be a complete list of foods and drinks to avoid. Contact a dietitian for more  information. Summary  Heart-healthy meal planning includes eating less unhealthy fats, eating more healthy fats, and making other changes in your diet.  Eat a balanced diet. This includes fruits and vegetables, low-fat or nonfat dairy, lean protein, nuts and legumes, whole grains, and heart-healthy oils and fats. This information is not intended to replace advice given to you by your health care provider. Make sure you discuss any questions you have with your health care provider. Document Revised: 12/10/2017 Document Reviewed: 11/13/2017 Elsevier Patient Education  2020 [employer].

## 2020-03-06 NOTE — Assessment & Plan Note (Signed)
History of paroxysmal A. fib maintaining sinus rhythm on bisoprolol and Eliquis

## 2020-03-06 NOTE — Assessment & Plan Note (Signed)
History of obstructive sleep apnea on CPAP which she uses religiously

## 2020-03-15 DIAGNOSIS — E782 Mixed hyperlipidemia: Secondary | ICD-10-CM | POA: Diagnosis not present

## 2020-03-16 LAB — HEPATIC FUNCTION PANEL
ALT: 14 IU/L (ref 0–32)
AST: 18 IU/L (ref 0–40)
Albumin: 4.2 g/dL (ref 3.7–4.7)
Alkaline Phosphatase: 72 IU/L (ref 48–121)
Bilirubin Total: 0.5 mg/dL (ref 0.0–1.2)
Bilirubin, Direct: 0.13 mg/dL (ref 0.00–0.40)
Total Protein: 6.7 g/dL (ref 6.0–8.5)

## 2020-03-16 LAB — LIPID PANEL
Chol/HDL Ratio: 4.7 ratio — ABNORMAL HIGH (ref 0.0–4.4)
Cholesterol, Total: 207 mg/dL — ABNORMAL HIGH (ref 100–199)
HDL: 44 mg/dL (ref >39)
LDL Chol Calc (NIH): 131 mg/dL — ABNORMAL HIGH (ref 0–99)
Triglycerides: 179 mg/dL — ABNORMAL HIGH (ref 0–149)
VLDL Cholesterol Cal: 32 mg/dL (ref 5–40)

## 2020-03-29 ENCOUNTER — Ambulatory Visit (HOSPITAL_COMMUNITY): Payer: Medicare Other | Attending: Cardiovascular Disease

## 2020-03-29 ENCOUNTER — Other Ambulatory Visit: Payer: Self-pay

## 2020-03-29 DIAGNOSIS — G4733 Obstructive sleep apnea (adult) (pediatric): Secondary | ICD-10-CM | POA: Insufficient documentation

## 2020-03-29 DIAGNOSIS — I480 Paroxysmal atrial fibrillation: Secondary | ICD-10-CM | POA: Diagnosis not present

## 2020-04-17 ENCOUNTER — Telehealth (INDEPENDENT_AMBULATORY_CARE_PROVIDER_SITE_OTHER): Payer: Medicare Other | Admitting: Cardiovascular Disease

## 2020-04-17 ENCOUNTER — Encounter: Payer: Self-pay | Admitting: Cardiovascular Disease

## 2020-04-17 VITALS — BP 156/74 | HR 60 | Ht 61.0 in | Wt 237.0 lb

## 2020-04-17 DIAGNOSIS — G4733 Obstructive sleep apnea (adult) (pediatric): Secondary | ICD-10-CM

## 2020-04-17 DIAGNOSIS — Z8639 Personal history of other endocrine, nutritional and metabolic disease: Secondary | ICD-10-CM

## 2020-04-17 DIAGNOSIS — I480 Paroxysmal atrial fibrillation: Secondary | ICD-10-CM

## 2020-04-17 DIAGNOSIS — I10 Essential (primary) hypertension: Secondary | ICD-10-CM

## 2020-04-17 NOTE — Progress Notes (Signed)
Virtual Visit via Telephone Note   This visit type was conducted due to national recommendations for restrictions regarding the COVID-19 Pandemic (e.g. social distancing) in an effort to limit this patient's exposure and mitigate transmission in our community.  Due to her co-morbid illnesses, this patient is at least at moderate risk for complications without adequate follow up.  This format is felt to be most appropriate for this patient at this time.  The patient did not have access to video technology/had technical difficulties with video requiring transitioning to audio format only (telephone).  All issues noted in this document were discussed and addressed.  No physical exam could be performed with this format.  Please refer to the patient's chart for her  consent to telehealth for [facility].   The patient was identified using 2 identifiers.  Date:  04/17/2020   ID:  Shannon Griffin, DOB 1945-12-24, MRN [id number]  Patient Location: Home Provider Location: Office  PCP:  Noni Saupe, MD  Cardiologist:  Nanetta Batty, MD, Bishop Limbo, MD (sleep) Electrophysiologist:  None   Evaluation Performed:  Follow-Up Visit  Chief Complaint:  2 year F/U  History of Present Illness:    Shannon Griffin is a 74 y.o. female who presents to sleep clinic for a 2-year follow-up evaluation of her obstructive sleep apnea.  Ms. Risser is followed by Dr. Allyson Sabal for cardiology care.  She has a history of obstructive sleep apnea which was diagnosed in 2013 and had an AHI of 6.2, an RDI of 10.5, but during rim sleep sleep apnea was moderate with an HI of 23 and RDI of 37.4.  She has a history of paroxysmal atrial fibrillation.  She initiated.  CPAP at 9 cm water pressure with elimination of snoring.  She was last seen by me 5 years ago and she was compliant at her 2 m water pressure with residual AHI at 1.5.  Over the last several years, she is continued with CPAP use.  He brought her machine to the  office today with her in the office.  This was interrogated.  She continues to be compliant with 100% use with average 30 day therapy at 8.24 hours.  Her AHI was 2.0.  Recently she has noticed that her CPAP machine is making significant noise and beginning to malfunction.  She has been using an AirFit PT 10 mask and is unaware of any significant leak.  Because of machine malfunction she now presents for evaluation.  She qualified for a new CPAP machine and received a new ResMed air sense 10 CPAP unit with a set up date on February 02, 2018.  Her DME company is [employer] [facility] in [place] and apparently [employer] so involved.    She noticed a significant improvement with her new machine compared to the old one.  A download was obtained from May 17, 2018 through June 15, 2018.  She is 100% compliant and averaging 8 hours and 43 minutes of sleep per night.  She is set at a pressure of 9 cm.  AHI is excellent at 0.9.  She has an ResMed AirFit P10 mask.  There is no leak.  She denies residual daytime sleepiness.  An Epworth Sleepiness Scale score was calculated in the office today and this endorsed at 7.  Since I last saw her, she has continued to see Dr. Allyson Sabal and last saw him in May 2021.  She was mainly sinus rhythm without recurrent atrial fibrillation.  She continues to see  Dr. Kyra SearlesAlzheimer who follows her multinodular goiter.  She has continued to use CPAP and has been using a pillow mask.  This at times causes nasal irritation.  I obtained a download today from 06/18/2020 through April 17, 2019 which confirms 100% compliance with average usage 8 hours and 21 minutes.  At a 9 cm set pressure, AHI is excellent at 0.8.  She presents for evaluation.  The patient does not have symptoms concerning for COVID-19 infection (fever, chills, cough, or new shortness of breath).    Past Medical History:  Diagnosis Date   Atrial fibrillation (HCC) 06/27/2011   Echo - EF 55-60%, LV wall thickness mildly increased;  mod/severe LA dilation; estimated pulm arterial systolic pressure elevated at [id number]   Heart murmur    Hyperlipidemia    Hypothyroid    OSA on CPAP 10/25/2012   ahi 1.5   Past Surgical History:  Procedure Laterality Date   CHOLECYSTECTOMY     TONSILLECTOMY       Current Meds  Medication Sig   apixaban (ELIQUIS) 5 MG TABS tablet Take 1 tablet (5 mg total) by mouth 2 (two) times daily.   bisoprolol (ZEBETA) 5 MG tablet Take 1/2 tablet (2.5 mg) by mouth every morning and 1 tablet in the evening.   ELDERBERRY PO Take 1 tablet by mouth daily.   levothyroxine (SYNTHROID, LEVOTHROID) 112 MCG tablet Take 112 mcg by mouth daily before breakfast. Take 1 tablet 6 days a week and half tablet on 7th day   Multiple Vitamins-Minerals (MULTIVITAMIN WITH MINERALS) tablet Take 1 tablet by mouth daily.   vitamin C (ASCORBIC ACID) 500 MG tablet Take 500 mg by mouth daily.   vitamin E 400 UNIT capsule Take 400 Units by mouth daily.     Allergies:   Freeze it [camphor-menthol] and Lidocaine   Social History   Tobacco Use   Smoking status: Never Smoker   Smokeless tobacco: Never Used  Substance Use Topics   Alcohol use: No   Drug use: No     Family Hx: The patient's family history includes Atrial fibrillation in her brother and mother.  ROS:   Please see the history of present illness.    No fevers chills night sweats No cough No awareness of palpitations or recurrent AF No bleeding on Eliquis No awareness of prior hypertension Positive for multinodular goiter Excellent compliance with CPAP All other systems reviewed and are negative.   Prior CV studies:   The following studies were reviewed today:  I obtained a new download in the office today as noted above May 30 through April 16, 2020.  AHI is excellent at 0.8 cm at 9 cm pressure.  She has 1% compliance with average usage at 8 hours and 21 minutes per night.   Labs/Other Tests and Data Reviewed:    EKG:  An ECG  dated 03/06/2020 was personally reviewed today and demonstrated:  Normal sinus rhythm at 63 bpm with nonspecific T wave abnormality.  No ectopy.  Normal intervals.  Recent Labs: 03/15/2020: ALT 14   Recent Lipid Panel Lab Results  Component Value Date/Time   CHOL 207 (H) 03/15/2020 08:03 AM   TRIG 179 (H) 03/15/2020 08:03 AM   HDL 44 03/15/2020 08:03 AM   CHOLHDL 4.7 (H) 03/15/2020 08:03 AM   LDLCALC 131 (H) 03/15/2020 08:03 AM    Wt Readings from Last 3 Encounters:  04/17/20 237 lb (107.5 kg)  03/06/20 240 lb (108.9 kg)  10/07/19 234 lb (106.1 kg)  Objective:    Vital Signs:  BP (!) 156/74 (BP Location: Left Wrist, Patient Position: Sitting, Cuff Size: Large)    Pulse 60    Ht 5\' 1"  (1.549 m)    Wt 237 lb (107.5 kg)    BMI 44.78 kg/m    Since this was a virtual visit I could not physically examine the patient Breathing was normal and not labored There is no audible wheezing There was no chest pain Heart rhythm was regular without ectopy and without evidence for recurrent atrial fibrillation She did not experience any pain or abdominal pain She denies any swelling She denied neurologic issues Normal affect and mood  ASSESSMENT & PLAN:    1. OSA: Ms. Odriscoll has previously documented mild overall sleep apnea which was moderate during REM sleep with REM AHI at 23 and RDI at 37.4.  She continues to be 100% compliant AHI is excellent at 0.8 at her 90 cm pressure.  She has been using a nasal pillow mask.  This has created some nasal irritation.  I reviewed with her some of the new masks and feel she would highly benefit from the ResMed AirFit N 30 little eye mask.  This will be very beneficial with the tubing coming from the head and the cushion not having any entrance into the nares.  At times however she also gets significant nasal congestion and it makes it very difficult for her to breathe through her nose.  She will contact her DME company which is [facility] to and  we will schedule for a ResMed pillow mask.  However, I suggested that she can also go to [email or web address].com or [employer] and purchase the Allyson Sabal 30 I mask which is the new fullface mask which is under the nose which she may benefit if in fact her nose is clogged and she would need mouth breathing for sleep.  We provided her information regarding obtaining this additional mask if needed. 2. Hypertension: Patient denies any prior hypertension history.  Blood pressure at home initially was 156/74 but she was moving in the house after getting her machine when she had taken her blood pressure.  She states blood pressure typically is much lower.  She is on bisoprolol at 2.5 mg twice a day.  I recommended she continue to monitor blood pressure closely.  I discussed new hypertensive guidelines with target blood pressure less than 130/80. 3. PAF: She is maintaining sinus rhythm.  She continues to be on Eliquis.  Again reviewed data regarding treatment of sleep apnea and significantly reduced recurrent AF risk. 4. History of multinodular goiter followed by Dr. [employer] a, currently on levothyroxine 112 mcg 6 days/week and 1/2 tablet on the seventh day.  COVID-19 Education: The signs and symptoms of COVID-19 were discussed with the patient and how to seek care for testing (follow up with PCP or arrange E-visit).  The importance of social distancing was discussed today.  Time:   Today, I have spent 23 minutes with the patient with telehealth technology discussing the above problems.     Medication Adjustments/Labs and Tests Ordered: Current medicines are reviewed at length with the patient today.  Concerns regarding medicines are outlined above.   Tests Ordered: No orders of the defined types were placed in this encounter.   Medication Changes: No orders of the defined types were placed in this encounter.   Follow Up: 1 year evaluation  Signed, Carin Hock, MD  04/17/2020 9:23 AM    [facility]

## 2020-04-17 NOTE — Patient Instructions (Signed)
Medication Instructions:  CONTINUE WITH CURRENT MEDICATIONS. NO CHANGES.  *If you need a refill on your cardiac medications before your next appointment, please call your pharmacy*    Follow-Up: At [facility], you and your health needs are our priority.  As part of our continuing mission to provide you with exceptional heart care, we have created designated Provider Care Teams.  These Care Teams include your primary Cardiologist (physician) and Advanced Practice Providers (APPs -  Physician Assistants and Nurse Practitioners) who all work together to provide you with the care you need, when you need it.  We recommend signing up for the patient portal called "MyChart".  Sign up information is provided on this After Visit Summary.  MyChart is used to connect with patients for Virtual Visits (Telemedicine).  Patients are able to view lab/test results, encounter notes, upcoming appointments, etc.  Non-urgent messages can be sent to your provider as well.   To learn more about what you can do with MyChart, go to [email or web address].    Your next appointment:   12 month(s)  The format for your next appointment:   In Person  Provider:   Nicki Guadalajara, MD   Other Instructions New mask- AirFit N30i  You can go to [email or web address] and order an AirFit F30i (full facemask) for when you have issues with allergies as discussed with Dr.Kelly in your office visit today.

## 2020-04-18 ENCOUNTER — Encounter: Payer: Self-pay | Admitting: Cardiovascular Disease

## 2020-04-24 ENCOUNTER — Telehealth: Payer: Self-pay | Admitting: Cardiovascular Disease

## 2020-04-24 ENCOUNTER — Telehealth: Payer: Self-pay | Admitting: *Deleted

## 2020-04-24 NOTE — Telephone Encounter (Signed)
-----   Message from June Leap June, RN sent at 04/17/2020 10:14 AM EDT ----- Pt needs new mask Resmed airFit N30i. Dme is [facility]

## 2020-04-24 NOTE — Telephone Encounter (Signed)
Spoke with pt, woke with the atrial fib Friday morning and it lasted until Saturday. She is concerned because her bp is running all over the place. 140-130/60. She took an extra bisoprolol for the atrial fib and that maybe why the bp running low. Patient given okay for lower number on bp to be 60. Okay for patient to resume regular activities and to call back with concerns. Pt agreed with this plan.

## 2020-04-24 NOTE — Telephone Encounter (Signed)
Faxed mask order to [facility] Patient, Shannon Griffin.

## 2020-04-24 NOTE — Telephone Encounter (Signed)
Patient c/o Palpitations:  High priority if patient c/o lightheadedness, shortness of breath, or chest pain  1) How long have you had palpitations/irregular HR/ Afib? Are you having the symptoms now? Started last Friday 04/20/20, not having symptoms now   2) Are you currently experiencing lightheadedness, SOB or CP? No   3) Do you have a history of afib (atrial fibrillation) or irregular heart rhythm? Yes   4) Have you checked your BP or HR? (document readings if available): has and states it is low for her   5) Are you experiencing any other symptoms? Headache & Excessive Fatigue since Sunday

## 2020-04-27 ENCOUNTER — Telehealth: Payer: Self-pay | Admitting: Internal Medicine

## 2020-04-27 ENCOUNTER — Telehealth (INDEPENDENT_AMBULATORY_CARE_PROVIDER_SITE_OTHER): Payer: Medicare Other | Admitting: Internal Medicine

## 2020-04-27 ENCOUNTER — Encounter: Payer: Self-pay | Admitting: Internal Medicine

## 2020-04-27 VITALS — BP 127/68 | HR 64 | Wt 237.0 lb

## 2020-04-27 DIAGNOSIS — E782 Mixed hyperlipidemia: Secondary | ICD-10-CM

## 2020-04-27 DIAGNOSIS — I480 Paroxysmal atrial fibrillation: Secondary | ICD-10-CM

## 2020-04-27 DIAGNOSIS — T466X5A Adverse effect of antihyperlipidemic and antiarteriosclerotic drugs, initial encounter: Secondary | ICD-10-CM

## 2020-04-27 DIAGNOSIS — M791 Myalgia, unspecified site: Secondary | ICD-10-CM

## 2020-04-27 DIAGNOSIS — G4733 Obstructive sleep apnea (adult) (pediatric): Secondary | ICD-10-CM | POA: Diagnosis not present

## 2020-04-27 MED ORDER — EZETIMIBE 10 MG PO TABS: 10.0000 mg | ORAL_TABLET | Freq: Every day | ORAL | 3 refills | Status: DC

## 2020-04-27 NOTE — Telephone Encounter (Signed)
PA for generic zetia submitted via CMM (Key: [id number]) PA questions noted below:  Does the patient have a diagnosis of homozygous familial sitosterolemia?  Yes  No Has the patient had a trial (medication samples/coupons/discount cards are excluded from consideration as a trial) of two preferred high intensity statins, or two statins at the maximally tolerated dose, and did not achieve LDL cholesterol goal? PLEASE NOTE: Preferred high intensity statin agents are atorvastatin (generic Lipitor) 40 mg or 80 mg, and rosuvastatin (generic Crestor) 20 mg or 40 mg.  Yes  No Is the patient statin intolerant based on one of the following: inability to tolerate at least 2 statins, with at least one started at the lowest starting daily dose, demonstrated by intolerable symptoms or clinically significant biomarker changes; or, statin associated rhabdomyolysis after a trial of one statin; or, for those with a contraindication* for statin therapy including active liver disease, unexplained persistent elevation of hepatic transaminases, or pregnancy? PLEASE NOTE: *Muscle aches are not considered a contraindication to statin therapy.  Yes  No

## 2020-04-27 NOTE — Progress Notes (Signed)
Virtual Visit via Telephone Note   This visit type was conducted due to national recommendations for restrictions regarding the COVID-19 Pandemic (e.g. social distancing) in an effort to limit this patient's exposure and mitigate transmission in our community.  Due to her co-morbid illnesses, this patient is at least at moderate risk for complications without adequate follow up.  This format is felt to be most appropriate for this patient at this time.  The patient did not have access to video technology/had technical difficulties with video requiring transitioning to audio format only (telephone).  All issues noted in this document were discussed and addressed.  No physical exam could be performed with this format.  Please refer to the patient's chart for her  consent to telehealth for [facility].   Evaluation Performed:  Telephone consult  Date:  04/27/2020   ID:  Shannon Griffin, DOB 30-Oct-1945, MRN [id number]  Patient Location:  [address] [place] [address]  Provider location:   [address], [address] [place], [place] [address]  PCP:  Noni Saupe, MD  Cardiologist:  Nanetta Batty, MD Electrophysiologist:  None   Chief Complaint: Manage dyslipidemia  History of Present Illness:    Shannon Griffin is a 74 y.o. female who presents via audio/video conferencing for a telehealth visit today.  This is a 74 year old female with a history of dyslipidemia, atrial fibrillation, and OSA on CPAP which is well controlled.  She is referred by Dr. Allyson Sabal for evaluation management of dyslipidemia.  Heart disease but unfortunately she has been intolerant of a number of medications and other products over the years.  She related to me today that her brother had died and an autopsy report indicated that the statin was somehow implicated in his death.  Based on this she is terrified of statin medications and is really not interested in trying them.  This makes options for lipid  therapy quite difficult as most of the more advanced lipid therapies require failure medication.  More recently her lipids indicated 207, triglycerides 179, HDL 44 and LDL 131.  Based on her risk factors I would target her to at least an LDL less than 100, however she did in January 2021 which was 141, 68th percentile for age and sex matched control.  Aortic root atherosclerosis was also noted.  Based on these findings I would be more aggressively target LDL less than 70.  She also could be a good candidate for PCSK9 inhibitor.  After discussing this, however she is quite concerned about side effects as well.  The patient does not have symptoms concerning for COVID-19 infection (fever, chills, cough, or new SHORTNESS OF BREATH).    Prior CV studies:   The following studies were reviewed today:  Reviewed, labs reviewed  PMHx:  Past Medical History:  Diagnosis Date  . Atrial fibrillation (HCC) 06/27/2011   Echo - EF 55-60%, LV wall thickness mildly increased; mod/severe LA dilation; estimated pulm arterial systolic pressure elevated at [id number]  . Heart murmur   . Hyperlipidemia   . Hypothyroid   . OSA on CPAP 10/25/2012   ahi 1.5    Past Surgical History:  Procedure Laterality Date  . CHOLECYSTECTOMY    . TONSILLECTOMY      FAMHx:  Family History  Problem Relation Age of Onset  . Atrial fibrillation Mother   . Atrial fibrillation Brother     SOCHx:   reports that she has never smoked. She has never used smokeless tobacco. She  reports that she does not drink alcohol and does not use drugs.  ALLERGIES:  Allergies  Allergen Reactions  . Freeze It [Camphor-Menthol]     Histofreeze  . Lidocaine Swelling    MEDS:  Current Meds  Medication Sig  . apixaban (ELIQUIS) 5 MG TABS tablet Take 1 tablet (5 mg total) by mouth 2 (two) times daily.  . bisoprolol (ZEBETA) 5 MG tablet Take 1/2 tablet (2.5 mg) by mouth every morning and 1 tablet in the evening.  Marland Kitchen ELDERBERRY PO Take 1  tablet by mouth daily.  Marland Kitchen levothyroxine (SYNTHROID, LEVOTHROID) 112 MCG tablet Take 112 mcg by mouth daily before breakfast. Take 1 tablet 6 days a week and half tablet on 7th day  . Multiple Vitamins-Minerals (MULTIVITAMIN WITH MINERALS) tablet Take 1 tablet by mouth daily.  . vitamin C (ASCORBIC ACID) 500 MG tablet Take 500 mg by mouth daily.  . vitamin E 400 UNIT capsule Take 400 Units by mouth daily.     ROS: Pertinent items noted in HPI and remainder of comprehensive ROS otherwise negative.  Labs/Other Tests and Data Reviewed:    Recent Labs: 03/15/2020: ALT 14   Recent Lipid Panel Lab Results  Component Value Date/Time   CHOL 207 (H) 03/15/2020 08:03 AM   TRIG 179 (H) 03/15/2020 08:03 AM   HDL 44 03/15/2020 08:03 AM   CHOLHDL 4.7 (H) 03/15/2020 08:03 AM   LDLCALC 131 (H) 03/15/2020 08:03 AM    Wt Readings from Last 3 Encounters:  04/27/20 237 lb (107.5 kg)  04/17/20 237 lb (107.5 kg)  03/06/20 240 lb (108.9 kg)     Exam:    Vital Signs:  BP 127/68   Pulse 64   Wt 237 lb (107.5 kg)   BMI 44.78 kg/m    Exam not performed due to telephone visit  ASSESSMENT & PLAN:    1. Mixed dyslipidemia, goal LDL less than 70 2. Statin intolerant-myalgias 3. Abnormal CAC score of 141, 68th percentile for age and sex matched control 4. Aortic root atherosclerosis 5. Family history of coronary disease 6. Atrial fibrillation on Eliquis  Shannon Griffin is quite concerned about statins and the possible side effects.  Although I think it is highly unlikely that the statin contributed to her brother's death, she is terrified of the medications and this will make her management fairly difficult.  She was also concerned about a PCSK9 inhibitor although I think she will qualify for it.  We will therefore start with ezetimibe 10 mg daily.  If she can tolerate that well we will see how her lipids respond.  She may also be a candidate for bempedoic acid although the cost may be prohibitive.  She  is also concerned about cost issues with the PCSK9 inhibitors as well.  Plan repeat lipids in about 3 to 4 months and follow-up with me at that time.  Thanks as always for the kind referral.  COVID-19 Education: The signs and symptoms of COVID-19 were discussed with the patient and how to seek care for testing (follow up with PCP or arrange E-visit).  The importance of social distancing was discussed today.  Patient Risk:   After full review of this patients clinical status, I feel that they are at least moderate risk at this time.  Time:   Today, I have spent 35 minutes with the patient with telehealth technology discussing dyslipidemia, statin intolerance, cardiovascular risk factors, alternatives to statin therapy.     Medication Adjustments/Labs and Tests Ordered: Current  medicines are reviewed at length with the patient today.  Concerns regarding medicines are outlined above.   Tests Ordered: Orders Placed This Encounter  Procedures  . Lipid panel    Medication Changes: Meds ordered this encounter  Medications  . ezetimibe (ZETIA) 10 MG tablet    Sig: Take 1 tablet (10 mg total) by mouth daily.    Dispense:  90 tablet    Refill:  3    Disposition:  in 4 month(s)  Chrystie Nose, MD, [facility], FACP  [place]  [facility]  Medical Director of the Advanced Lipid Disorders &  Cardiovascular Risk Reduction Clinic Diplomate of the American Board of Clinical Lipidology Attending Cardiologist  Direct Dial: [phone]  Fax: [phone]  Website:  [email or web address]  Chrystie Nose, MD  04/27/2020 10:15 AM

## 2020-04-27 NOTE — Patient Instructions (Signed)
Medication Instructions:  START zetia 10mg  daily  *If you need a refill on your cardiac medications before your next appointment, please call your pharmacy*   Lab Work: FASTING lipid panel in 3 months to check cholesterol   If you have labs (blood work) drawn today and your tests are completely normal, you will receive your results only by: MyChart Message (if you have MyChart) OR . A paper copy in the mail If you have any lab test that is abnormal or we need to change your treatment, we will call you to review the results.   Testing/Procedures: NONE   Follow-Up: At [facility], you and your health needs are our priority.  As part of our continuing mission to provide you with exceptional heart care, we have created designated Provider Care Teams.  These Care Teams include your primary Cardiologist (physician) and Advanced Practice Providers (APPs -  Physician Assistants and Nurse Practitioners) who all work together to provide you with the care you need, when you need it.  We recommend signing up for the patient portal called "MyChart".  Sign up information is provided on this After Visit Summary.  MyChart is used to connect with patients for Virtual Visits (Telemedicine).  Patients are able to view lab/test results, encounter notes, upcoming appointments, etc.  Non-urgent messages can be sent to your provider as well.   To learn more about what you can do with MyChart, go to [facility].    Your next appointment:   3 month(s)  The format for your next appointment:   Either In Person or Virtual  Provider:   [email or web address] Kirtland Bouchard Hilty, MD   Other Instructions

## 2020-04-30 NOTE — Telephone Encounter (Signed)
PA Case: [id number], Status: Denied  Appeal info not available via CMM - awaiting fax

## 2020-04-30 NOTE — Telephone Encounter (Signed)
[place] [employer] has denied coverage of zetia.   Consideration for approval of medication requirements: trial of 2 preferred high intensity statins (atorvastatin 40 or 80mg , rosuvastatin 20 or 40mg ) or 2 statins at the maximally tolerate dose, and did not achieve LDL goal.   Clinical reviewer phone # [phone]  Message sent to MD

## 2020-05-01 NOTE — Telephone Encounter (Signed)
Patient updated on zetia via MyChart message

## 2020-05-01 NOTE — Telephone Encounter (Signed)
Let her know that zetia was denied because she did not try a statin - also, other non-statin meds will likely be denied due to this. This may change her mind about trying a statin - I do think it is safe and will monitor it closely if she is willing.  Dr Rexene Edison

## 2020-05-09 MED ORDER — EZETIMIBE 10 MG PO TABS: 10.0000 mg | ORAL_TABLET | Freq: Every day | ORAL | 11 refills | Status: DC

## 2020-05-09 NOTE — Telephone Encounter (Signed)
Called patient - she is not willing to try a statin. She will pay cash-price for zetia. Rx sent to [facility]. Advised she can check out good Rx for coupon

## 2020-06-11 ENCOUNTER — Other Ambulatory Visit: Payer: Self-pay | Admitting: Cardiovascular Disease

## 2020-06-11 NOTE — Telephone Encounter (Signed)
*  STAT* If patient is at the pharmacy, call can be transferred to refill team.   1. Which medications need to be refilled? (please list name of each medication and dose if known)  apixaban (ELIQUIS) 5 MG TABS tablet bisoprolol (ZEBETA) 5 MG tablet  2. Which pharmacy/location (including street and city if local pharmacy) is medication to be sent to? [facility] - [place], [place] - [address]  3. Do they need a 30 day or 90 day supply? 90 day supply

## 2020-06-12 MED ORDER — APIXABAN 5 MG PO TABS: 5.0000 mg | ORAL_TABLET | Freq: Two times a day (BID) | ORAL | 1 refills | Status: DC

## 2020-06-12 NOTE — Telephone Encounter (Signed)
eliquis refilled

## 2020-06-13 MED ORDER — BISOPROLOL FUMARATE 5 MG PO TABS: ORAL_TABLET | ORAL | 2 refills | Status: DC

## 2020-08-09 DIAGNOSIS — E782 Mixed hyperlipidemia: Secondary | ICD-10-CM | POA: Diagnosis not present

## 2020-08-10 LAB — LIPID PANEL
Chol/HDL Ratio: 5.4 ratio — ABNORMAL HIGH (ref 0.0–4.4)
Cholesterol, Total: 205 mg/dL — ABNORMAL HIGH (ref 100–199)
HDL: 38 mg/dL — ABNORMAL LOW (ref >39)
LDL Chol Calc (NIH): 134 mg/dL — ABNORMAL HIGH (ref 0–99)
Triglycerides: 182 mg/dL — ABNORMAL HIGH (ref 0–149)
VLDL Cholesterol Cal: 33 mg/dL (ref 5–40)

## 2020-08-21 ENCOUNTER — Encounter: Payer: Self-pay | Admitting: Internal Medicine

## 2020-08-21 ENCOUNTER — Telehealth (INDEPENDENT_AMBULATORY_CARE_PROVIDER_SITE_OTHER): Payer: Medicare Other | Admitting: Internal Medicine

## 2020-08-21 VITALS — BP 129/69 | HR 50 | Wt 227.0 lb

## 2020-08-21 DIAGNOSIS — Z8249 Family history of ischemic heart disease and other diseases of the circulatory system: Secondary | ICD-10-CM

## 2020-08-21 DIAGNOSIS — T466X5A Adverse effect of antihyperlipidemic and antiarteriosclerotic drugs, initial encounter: Secondary | ICD-10-CM

## 2020-08-21 DIAGNOSIS — E782 Mixed hyperlipidemia: Secondary | ICD-10-CM | POA: Diagnosis not present

## 2020-08-21 DIAGNOSIS — M791 Myalgia, unspecified site: Secondary | ICD-10-CM

## 2020-08-21 NOTE — Patient Instructions (Signed)
Medication Instructions:  Your physician recommends that you continue on your current medications as directed. Please refer to the Current Medication list given to you today.  *If you need a refill on your cardiac medications before your next appointment, please call your pharmacy*   Follow-Up: At [facility], you and your health needs are our priority.  As part of our continuing mission to provide you with exceptional heart care, we have created designated Provider Care Teams.  These Care Teams include your primary Cardiologist (physician) and Advanced Practice Providers (APPs -  Physician Assistants and Nurse Practitioners) who all work together to provide you with the care you need, when you need it.  We recommend signing up for the patient portal called "MyChart".  Sign up information is provided on this After Visit Summary.  MyChart is used to connect with patients for Virtual Visits (Telemedicine).  Patients are able to view lab/test results, encounter notes, upcoming appointments, etc.  Non-urgent messages can be sent to your provider as well.   To learn more about what you can do with MyChart, go to [email or web address].    Your next appointment:   AS NEEDED with Dr. Hilty for lipid management    

## 2020-08-21 NOTE — Progress Notes (Signed)
Virtual Visit via Telephone Note   This visit type was conducted due to national recommendations for restrictions regarding the COVID-19 Pandemic (e.g. social distancing) in an effort to limit this patient's exposure and mitigate transmission in our community.  Due to her co-morbid illnesses, this patient is at least at moderate risk for complications without adequate follow up.  This format is felt to be most appropriate for this patient at this time.  The patient did not have access to video technology/had technical difficulties with video requiring transitioning to audio format only (telephone).  All issues noted in this document were discussed and addressed.  No physical exam could be performed with this format.  Please refer to the patient's chart for her  consent to telehealth for [facility].   Evaluation Performed:  Telephone consult  Date:  08/21/2020   ID:  Shannon Griffin, DOB 04/06/1946, MRN [id number]  Patient Location:  [address] [place] [address]  Provider location:   [address] [place], [place] [address]  PCP:  Noni Saupe, MD  Cardiologist:  Nanetta Batty, MD Electrophysiologist:  None   Chief Complaint: Follow-up dyslipidemia  History of Present Illness:    Shannon Griffin is a 74 y.o. female who presents via audio/video conferencing for a telehealth visit today.  This is a 74 year old female with a history of dyslipidemia, atrial fibrillation, and OSA on CPAP which is well controlled.  She is referred by Dr. Allyson Sabal for evaluation management of dyslipidemia.  Heart disease but unfortunately she has been intolerant of a number of medications and other products over the years.  She related to me today that her brother had died and an autopsy report indicated that the statin was somehow implicated in his death.  Based on this she is terrified of statin medications and is really not interested in trying them.  This makes options for lipid  therapy quite difficult as most of the more advanced lipid therapies require failure medication.  More recently her lipids indicated 207, triglycerides 179, HDL 44 and LDL 131.  Based on her risk factors I would target her to at least an LDL less than 100, however she did in January 2021 which was 141, 68th percentile for age and sex matched control.  Aortic root atherosclerosis was also noted.  Based on these findings I would be more aggressively target LDL less than 70.  She also could be a good candidate for PCSK9 inhibitor.  After discussing this, however she is quite concerned about side effects as well.   08/21/2020  Shannon Griffin is seen today via telephone follow-up.  She never started the ezetimibe because she has a history of thyroid disease and some hyperglycemia and she read that it could worsen that.  She also would not take the medicine to help her lose weight that was prescribed by her PCP which she said later was recalled due to issues that it might cause cancer.  She says she has little faith in the medical system and in medications.  She also told me that she had some friends who are nurses that suggested that the lipid profile was not accurate in older adults.  Based on that it does not sound like she is interested in additional medications although she may qualify for them.  I advised her that she does have age advanced coronary disease and significant risk factors for stroke and heart disease.  She remains above target LDL therapy and does have  higher than expected risk based on her dyslipidemia.  The patient does not have symptoms concerning for COVID-19 infection (fever, chills, cough, or new SHORTNESS OF BREATH).    Prior CV studies:   The following studies were reviewed today:  Reviewed, labs reviewed  PMHx:  Past Medical History:  Diagnosis Date   Atrial fibrillation (HCC) 06/27/2011   Echo - EF 55-60%, LV wall thickness mildly increased; mod/severe LA dilation; estimated pulm  arterial systolic pressure elevated at 64-40HKVQ   Heart murmur    Hyperlipidemia    Hypothyroid    OSA on CPAP 10/25/2012   ahi 1.5    Past Surgical History:  Procedure Laterality Date   CHOLECYSTECTOMY     TONSILLECTOMY      FAMHx:  Family History  Problem Relation Age of Onset   Atrial fibrillation Mother    Atrial fibrillation Brother     SOCHx:   reports that she has never smoked. She has never used smokeless tobacco. She reports that she does not drink alcohol and does not use drugs.  ALLERGIES:  Allergies  Allergen Reactions   Freeze It [Camphor-Menthol]     Histofreeze   Lidocaine Swelling    MEDS:  Current Meds  Medication Sig   apixaban (ELIQUIS) 5 MG TABS tablet Take 1 tablet (5 mg total) by mouth 2 (two) times daily.   bisoprolol (ZEBETA) 5 MG tablet TAKE 1/2 TABLET EVERY      MORNING AND 1 TABLET EVERY EVENING *UNICHEM MFR*   ELDERBERRY PO Take 1 tablet by mouth daily.   KRILL OIL PO Take by mouth. Takes occasionally   levothyroxine (SYNTHROID, LEVOTHROID) 112 MCG tablet Take 112 mcg by mouth daily before breakfast. Take 1 tablet 6 days a week and half tablet on 7th day   Multiple Vitamins-Minerals (MULTIVITAMIN WITH MINERALS) tablet Take 1 tablet by mouth daily.   vitamin C (ASCORBIC ACID) 500 MG tablet Take 500 mg by mouth daily.   vitamin E 400 UNIT capsule Take 400 Units by mouth daily.     ROS: Pertinent items noted in HPI and remainder of comprehensive ROS otherwise negative.  Labs/Other Tests and Data Reviewed:    Recent Labs: 03/15/2020: ALT 14   Recent Lipid Panel Lab Results  Component Value Date/Time   CHOL 205 (H) 08/09/2020 08:37 AM   TRIG 182 (H) 08/09/2020 08:37 AM   HDL 38 (L) 08/09/2020 08:37 AM   CHOLHDL 5.4 (H) 08/09/2020 08:37 AM   LDLCALC 134 (H) 08/09/2020 08:37 AM    Wt Readings from Last 3 Encounters:  08/21/20 227 lb (103 kg)  04/27/20 237 lb (107.5 kg)  04/17/20 237 lb (107.5 kg)     Exam:     Vital Signs:  BP 129/69    Pulse (!) 50    Wt 227 lb (103 kg)    BMI 42.89 kg/m    Exam not performed due to telephone visit  ASSESSMENT & PLAN:    1. Mixed dyslipidemia, goal LDL less than 70 2. Statin intolerant-myalgias 3. Abnormal CAC score of 141, 68th percentile for age and sex matched control 4. Aortic root atherosclerosis 5. Family history of coronary disease 6. Atrial fibrillation on Eliquis  Shannon Griffin did not take ezetimibe based on concerns about the effects on her thyroid and her glucose.  She did not feel that her insurance would cover the PCSK9 inhibitors or bempedoic acid or other therapies.  She question the validity of lipid testing in older adults.  Options at  this point are limited based on her medication hesitancy.  I have encouraged her to be as aggressive as possible with lowering saturated fats in her diet to try to reduce her risk as naturally as possible.  Follow-up with me as needed if she wishes to pursue therapies.  COVID-19 Education: The signs and symptoms of COVID-19 were discussed with the patient and how to seek care for testing (follow up with PCP or arrange E-visit).  The importance of social distancing was discussed today.  Patient Risk:   After full review of this patients clinical status, I feel that they are at least moderate risk at this time.  Time:   Today, I have spent 15 minutes with the patient with telehealth technology discussing dyslipidemia, statin intolerance, cardiovascular risk factors, alternatives to statin therapy.     Medication Adjustments/Labs and Tests Ordered: Current medicines are reviewed at length with the patient today.  Concerns regarding medicines are outlined above.   Tests Ordered: No orders of the defined types were placed in this encounter.   Medication Changes: No orders of the defined types were placed in this encounter.   Disposition:  prn  Chrystie Nose, MD, Milagros Loll  [place]   [facility]  HeartCare  Medical Director of the Advanced Lipid Disorders &  Cardiovascular Risk Reduction Clinic Diplomate of the American Board of Clinical Lipidology Attending Cardiologist  Direct Dial: [phone]   Fax: [phone]  Website:  [email or web address]  Chrystie Nose, MD  08/21/2020 9:15 AM

## 2020-08-27 ENCOUNTER — Other Ambulatory Visit: Payer: Self-pay | Admitting: Cardiovascular Disease

## 2020-09-10 ENCOUNTER — Telehealth (HOSPITAL_COMMUNITY): Payer: Self-pay | Admitting: *Deleted

## 2020-09-10 NOTE — Telephone Encounter (Signed)
Patient called in stating she went into AF on Saturday HRs ranging from 90-110s. She will try extra 1/2 tab of bisoprolol this morning as this has converted her in the past. Appointment made for tomorrow for assessment.

## 2020-09-11 ENCOUNTER — Ambulatory Visit (HOSPITAL_COMMUNITY)
Admission: RE | Admit: 2020-09-11 | Discharge: 2020-09-11 | Disposition: A | Discharge status: Home / Self Care | Payer: Medicare Other | Source: Ambulatory Visit | Attending: Physician Assistant | Admitting: Physician Assistant

## 2020-09-11 ENCOUNTER — Other Ambulatory Visit: Payer: Self-pay

## 2020-09-11 ENCOUNTER — Encounter (HOSPITAL_COMMUNITY): Payer: Self-pay | Admitting: Physician Assistant

## 2020-09-11 VITALS — BP 132/78 | HR 57 | Ht 61.0 in | Wt 235.8 lb

## 2020-09-11 DIAGNOSIS — D6869 Other thrombophilia: Secondary | ICD-10-CM | POA: Diagnosis not present

## 2020-09-11 DIAGNOSIS — Z7901 Long term (current) use of anticoagulants: Secondary | ICD-10-CM | POA: Insufficient documentation

## 2020-09-11 DIAGNOSIS — Z6841 Body Mass Index (BMI) 40.0 and over, adult: Secondary | ICD-10-CM | POA: Insufficient documentation

## 2020-09-11 DIAGNOSIS — G4733 Obstructive sleep apnea (adult) (pediatric): Secondary | ICD-10-CM | POA: Diagnosis not present

## 2020-09-11 DIAGNOSIS — I480 Paroxysmal atrial fibrillation: Secondary | ICD-10-CM | POA: Diagnosis not present

## 2020-09-11 DIAGNOSIS — E669 Obesity, unspecified: Secondary | ICD-10-CM | POA: Insufficient documentation

## 2020-09-11 NOTE — Progress Notes (Signed)
Primary Care Physician: Noni Saupe, MD Primary Cardiologist: Dr Allyson Sabal Primary Electrophysiologist: none Referring Physician: [facility]   Shannon Griffin is a 74 y.o. female with a history of HLD, hypothyroid, OSA, and atrial fibrillation who presents for follow up in the [facility]. The patient was initially diagnosed with atrial fibrillation in 2012 and had a DCCV in 2015. Patient is on Eliquis for a CHADS2VASC score of 3. Patient was in her usual state of health until the evening of 09/07/20 when she noted elevated heart rates and increased fatigue. She took an extra dose of bisoprolol on 11/20 and 11/21 and converted back to SR. She did have a high salt dinner prior to the onset of her symptoms which she believes triggered her afib. She is in SR today and feeling well. She denies any bleeding issues on anticoagulation.   Today, she denies symptoms of palpitations, chest pain, shortness of breath, orthopnea, PND, lower extremity edema, dizziness, presyncope, syncope, bleeding, or neurologic sequela. The patient is tolerating medications without difficulties and is otherwise without complaint today.    Atrial Fibrillation Risk Factors:  she does have symptoms or diagnosis of sleep apnea. she is compliant with CPAP therapy. she does not have a history of rheumatic fever. she does not have a history of alcohol use. The patient does have a history of early familial atrial fibrillation or other arrhythmias. Brother had afib.  she has a BMI of Body mass index is 44.55 kg/m.Marland Kitchen Filed Weights   09/11/20 1042  Weight: 107 kg    Family History  Problem Relation Age of Onset  . Atrial fibrillation Mother   . Atrial fibrillation Brother      Atrial Fibrillation Management history:  Previous antiarrhythmic drugs: none Previous cardioversions: 2015 Previous ablations: none CHADS2VASC score: 3 Anticoagulation history: Eliquis   Past Medical  History:  Diagnosis Date  . Atrial fibrillation (HCC) 06/27/2011   Echo - EF 55-60%, LV wall thickness mildly increased; mod/severe LA dilation; estimated pulm arterial systolic pressure elevated at 10-27OZDG  . Heart murmur   . Hyperlipidemia   . Hypothyroid   . OSA on CPAP 10/25/2012   ahi 1.5   Past Surgical History:  Procedure Laterality Date  . CHOLECYSTECTOMY    . TONSILLECTOMY      Current Outpatient Medications  Medication Sig Dispense Refill  . bisoprolol (ZEBETA) 5 MG tablet TAKE 1/2 TABLET EVERY      MORNING AND 1 TABLET EVERY EVENING *UNICHEM MFR* 135 tablet 2  . ELDERBERRY PO Take 1 tablet by mouth daily.    Marland Kitchen ELIQUIS 5 MG TABS tablet TAKE 1 TABLET TWICE A DAY 180 tablet 1  . KRILL OIL PO Take by mouth. Takes occasionally    . levothyroxine (SYNTHROID, LEVOTHROID) 112 MCG tablet Take 112 mcg by mouth daily before breakfast. Take 1 tablet 6 days a week and half tablet on 7th day    . Multiple Vitamins-Minerals (MULTIVITAMIN WITH MINERALS) tablet Take 1 tablet by mouth daily.    . vitamin C (ASCORBIC ACID) 500 MG tablet Take 500 mg by mouth daily.    . vitamin E 400 UNIT capsule Take 400 Units by mouth daily.     No current facility-administered medications for this encounter.    Allergies  Allergen Reactions  . Freeze It [Camphor-Menthol]     Histofreeze  . Lidocaine Swelling    Social History   Socioeconomic History  . Marital status: Widowed  Spouse name: Not on file  . Number of children: Not on file  . Years of education: Not on file  . Highest education level: Not on file  Occupational History  . Not on file  Tobacco Use  . Smoking status: Never Smoker  . Smokeless tobacco: Never Used  Substance and Sexual Activity  . Alcohol use: No  . Drug use: No  . Sexual activity: Never  Other Topics Concern  . Not on file  Social History Narrative  . Not on file   Social Determinants of Health   Financial Resource Strain:   . Difficulty of Paying  Living Expenses: Not on file  Food Insecurity:   . Worried About Programme researcher, broadcasting/film/video in the Last Year: Not on file  . Ran Out of Food in the Last Year: Not on file  Transportation Needs:   . Lack of Transportation (Medical): Not on file  . Lack of Transportation (Non-Medical): Not on file  Physical Activity:   . Days of Exercise per Week: Not on file  . Minutes of Exercise per Session: Not on file  Stress:   . Feeling of Stress : Not on file  Social Connections:   . Frequency of Communication with Friends and Family: Not on file  . Frequency of Social Gatherings with Friends and Family: Not on file  . Attends Religious Services: Not on file  . Active Member of Clubs or Organizations: Not on file  . Attends Banker Meetings: Not on file  . Marital Status: Not on file  Intimate Partner Violence:   . Fear of Current or Ex-Partner: Not on file  . Emotionally Abused: Not on file  . Physically Abused: Not on file  . Sexually Abused: Not on file     ROS- All systems are reviewed and negative except as per the HPI above.  Physical Exam: Vitals:   09/11/20 1042  BP: 132/78  Pulse: (!) 57  Weight: 107 kg  Height: 5\' 1"  (1.549 m)    GEN- The patient is well appearing obese elderly female, alert and oriented x 3 today.   Head- normocephalic, atraumatic Eyes-  Sclera clear, conjunctiva pink Ears- hearing intact Oropharynx- clear Neck- supple  Lungs- Clear to ausculation bilaterally, normal work of breathing Heart- Regular rate and rhythm, no murmurs, rubs or gallops  GI- soft, NT, ND, + BS Extremities- no clubbing, cyanosis, or edema MS- no significant deformity or atrophy Skin- no rash or lesion Psych- euthymic mood, full affect Neuro- strength and sensation are intact  Wt Readings from Last 3 Encounters:  09/11/20 107 kg  08/21/20 103 kg  04/27/20 107.5 kg    EKG today demonstrates SB HR 57, PR 136, QRS 78, QTc 410  Echo 03/29/20 demonstrated  1. Left  ventricular ejection fraction, by estimation, is 60 to 65%. The  left ventricle has normal function. The left ventricle has no regional  wall motion abnormalities. Left ventricular diastolic parameters were  normal. The average left ventricular  global longitudinal strain is -23.1 %.  2. Right ventricular systolic function is normal. The right ventricular  size is normal. There is moderately elevated pulmonary artery systolic  pressure.  3. Left atrial size was mildly dilated.  4. The mitral valve is normal in structure. Trivial mitral valve  regurgitation. No evidence of mitral stenosis.  5. The aortic valve is normal in structure. Aortic valve regurgitation is  not visualized. No aortic stenosis is present.  6. Aortic dilatation noted.  There is mild dilatation of the ascending  aorta measuring 38 mm.  Epic records are reviewed at length today  CHA2DS2-VASc Score = 3  The patient's score is based upon: CHF History: 0 HTN History: 0 Diabetes History: 0 Stroke History: 0 Vascular Disease History: 0 Age Score: 2 Gender Score: 1      ASSESSMENT AND PLAN: 1. Paroxysmal Atrial Fibrillation (ICD10:  I48.0) The patient's CHA2DS2-VASc score is 3, indicating a 3.2% annual risk of stroke.   Patient back in SR.  Briefly discussed AAD if she has frequent recurrence of her afib. Focus on lifestyle modification for now.  Continue bisoprolol 2.5 mg BID Continue Eliquis 5 mg BID  2. Secondary Hypercoagulable State (ICD10:  D68.69) The patient is at significant risk for stroke/thromboembolism based upon her CHA2DS2-VASc Score of 3.  Continue Apixaban (Eliquis).   3. Obesity Body mass index is 44.55 kg/m. Lifestyle modification was discussed at length including regular exercise and weight reduction.  4. Obstructive sleep apnea The importance of adequate treatment of sleep apnea was discussed today in order to improve our ability to maintain sinus rhythm long term. Patient reports  compliance with CPAP therapy.   Follow up with Dr Allyson Sabal per recall. AF clinic as needed.    Jorja Loa PA-C [facility] [address] [place], [place] [address] [phone] 09/11/2020 10:49 AM

## 2020-11-02 DIAGNOSIS — E063 Autoimmune thyroiditis: Secondary | ICD-10-CM | POA: Diagnosis not present

## 2020-11-08 IMAGING — DX DG CHEST 1V PORT
1 series · 1 of 1 positions shown · non-contrast
Comparison: None.

CLINICAL DATA: Atrial fibrillation

EXAM:
PORTABLE CHEST 1 VIEW

[chest ap]
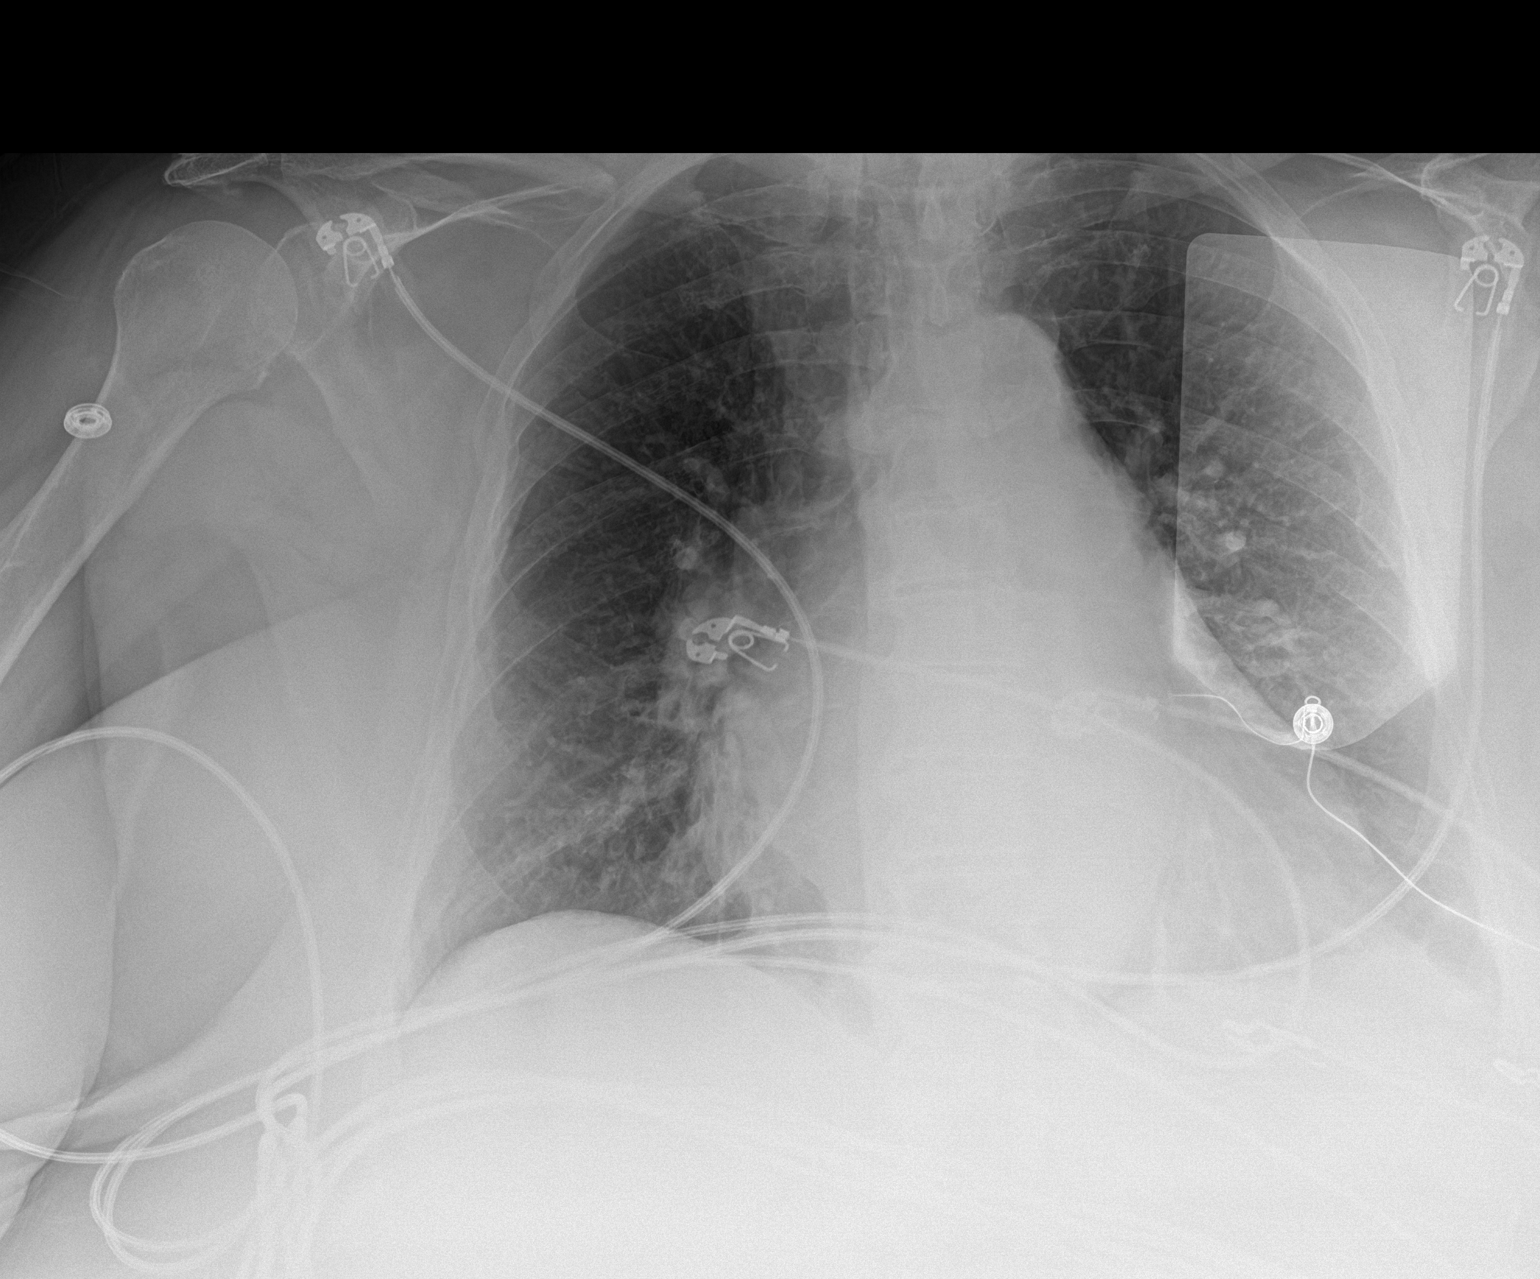

[1 of 1 positions shown; findings below may reference images not displayed]

FINDINGS: The heart size is enlarged. The aorta is tortuous. Both lungs are
clear. Chronic deformities of several right ribs are identified.
IMPRESSION: No active cardiopulmonary disease.  Cardiomegaly

## 2021-01-21 ENCOUNTER — Telehealth: Payer: Self-pay | Admitting: Cardiovascular Disease

## 2021-01-21 NOTE — Telephone Encounter (Signed)
Shannon Griffin is calling wanting to know if she needs to bring her CPAP machine to her upcoming 1 year follow up with Dr. Tresa Endo. She is requesting she receive a response in regards to this by MyChart message. Please advise.

## 2021-01-25 NOTE — Telephone Encounter (Signed)
My chart message sent to the patient informing her that she does not need to bring her CPAP machine to her appointment.

## 2021-02-01 DIAGNOSIS — E039 Hypothyroidism, unspecified: Secondary | ICD-10-CM | POA: Diagnosis not present

## 2021-02-01 DIAGNOSIS — E063 Autoimmune thyroiditis: Secondary | ICD-10-CM | POA: Diagnosis not present

## 2021-02-05 ENCOUNTER — Other Ambulatory Visit: Payer: Self-pay | Admitting: Cardiovascular Disease

## 2021-02-06 DIAGNOSIS — E063 Autoimmune thyroiditis: Secondary | ICD-10-CM | POA: Diagnosis not present

## 2021-02-06 DIAGNOSIS — E042 Nontoxic multinodular goiter: Secondary | ICD-10-CM | POA: Diagnosis not present

## 2021-02-06 DIAGNOSIS — E669 Obesity, unspecified: Secondary | ICD-10-CM | POA: Diagnosis not present

## 2021-02-06 DIAGNOSIS — Z6841 Body Mass Index (BMI) 40.0 and over, adult: Secondary | ICD-10-CM | POA: Diagnosis not present

## 2021-02-06 DIAGNOSIS — E039 Hypothyroidism, unspecified: Secondary | ICD-10-CM | POA: Diagnosis not present

## 2021-03-12 ENCOUNTER — Ambulatory Visit (INDEPENDENT_AMBULATORY_CARE_PROVIDER_SITE_OTHER): Payer: Medicare Other | Admitting: Cardiovascular Disease

## 2021-03-12 ENCOUNTER — Encounter: Payer: Self-pay | Admitting: Cardiovascular Disease

## 2021-03-12 ENCOUNTER — Other Ambulatory Visit: Payer: Self-pay

## 2021-03-12 DIAGNOSIS — G4733 Obstructive sleep apnea (adult) (pediatric): Secondary | ICD-10-CM

## 2021-03-12 DIAGNOSIS — E782 Mixed hyperlipidemia: Secondary | ICD-10-CM

## 2021-03-12 DIAGNOSIS — I480 Paroxysmal atrial fibrillation: Secondary | ICD-10-CM

## 2021-03-12 NOTE — Assessment & Plan Note (Signed)
History of hyper lipidemia intolerant to Zetia and not willing to take statin therapy with a recent fasting lipid profile performed 08/09/2020 revealing total cholesterol 205, LDL 134 and HDL of 38.  In addition, she did have a coronary calcium score of 141 measured on 10/26/2019.  She has seen Dr. Rennis Golden who had raised the possibility of PCSK9 and she is willing to agree entertain this.

## 2021-03-12 NOTE — Assessment & Plan Note (Signed)
History of obstructive sleep apnea on CPAP which she wears religiously 

## 2021-03-12 NOTE — Progress Notes (Signed)
03/12/2021 Shannon Griffin   03/05/1946  [id number]  Primary Physician Jeanie Sewer [name], MD Primary Cardiologist: [name] Nicholes Calamity, [place]  HPI:  Shannon Griffin is a 75 y.o.   moderately overweight widowed Caucasian female mother of one daughter, grandmother and one grandchild who I last saw  03/06/2020. She relocated from [place] to be closer to her family. Her history is remarkable for paroxysmal atrial fibrillation and mild hyperlipidemia as well as obesity and obstructive sleep apnea. She wears C Pap which she benefits from.. She denies chest pain or shortness of breath. She had a 2-D echocardiogram performed in the past that showed normal LV size and function with moderate to severe left atrial enlargement. Since I saw her a year ago she's remained stable. She does work in the yard. She is unable to lose weight however. She gets occasional episodes of palpitations which may be brief though infrequent episodes of PAF currently in sinus rhythm. The CHA2DSVASC2 score is 2 . We discussed the risks and benefits of oral anticoagulation with regard to stroke prophylaxis.She is currently on Eliquisoral anticoagulation. Since I saw her year ago she's remained asymptomatic specifically denying chest pain. She does have some mild dyspnea on exertion probably multifactorial related to obesity and deconditioning  Shedid have an episode of A. fib with RVR brought her to the emergency room on 12/05/2018. She underwent DC cardioversion by Dr. Patria Mane successfully back to sinus rhythm. She saw Rudi Coco, NP in the A. fib clinic several days later in sinus rhythm. Otherwise, she gets some dyspnea on exertion. She denies chest pain. She continues to wear CPAP for her sleep apnea. She is had little success with weight loss however.   She has occasional episodes of PAF with which resolve quickly and spontaneously.  She really has been unsuccessful with weight loss.  She does  wear her CPAP.  She denies chest pain or shortness of breath but does complain of daytime somnolence.  She had a lipid profile performed 10/11/2019 that revealed total cholesterol 207 with an LDL of 135.  She is resistant to starting statin therapy.  I referred her to Dr. Rennis Golden.  She was started on Zetia which was ineffective.  She went on a diet which did not affect her lipid profile.  PCSK9 was raised in her discussion with Dr. Rennis Golden which she was hesitant to pursue at that time but now is receptive.  Since I saw her 10 months ago she did see Joni Reining in the office 03/29/2019 for an episode of PAF. This resolved fairly quickly with taking a half of the bisoprolol. She complains of some atypical chest pain which does not sound anginal as well as chronic dyspnea. She does have a 2D echo that was performed in the past which was normal.   Current Meds  Medication Sig  . bisoprolol (ZEBETA) 5 MG tablet TAKE 1/2 TABLET EVERY      MORNING AND 1 TABLET EVERY EVENING *UNICHEM MFR*  . ELDERBERRY PO Take 1 tablet by mouth daily.  Marland Kitchen ELIQUIS 5 MG TABS tablet TAKE 1 TABLET TWICE A DAY  . KRILL OIL PO Take by mouth. Takes occasionally  . levothyroxine (SYNTHROID, LEVOTHROID) 112 MCG tablet Take 112 mcg by mouth daily before breakfast. Take 1 tablet 6 days a week and half tablet on 7th day  . Multiple Vitamins-Minerals (MULTIVITAMIN WITH MINERALS) tablet Take 1 tablet by mouth daily.  . vitamin C (ASCORBIC ACID) 500  MG tablet Take 500 mg by mouth daily.  . vitamin E 400 UNIT capsule Take 400 Units by mouth daily.     Allergies  Allergen Reactions  . Freeze It [Camphor-Menthol]     Histofreeze  . Lidocaine Swelling    Social History   Socioeconomic History  . Marital status: Widowed    Spouse name: Not on file  . Number of children: Not on file  . Years of education: Not on file  . Highest education level: Not on file  Occupational History  . Not on file  Tobacco Use  . Smoking status:  Never Smoker  . Smokeless tobacco: Never Used  Substance and Sexual Activity  . Alcohol use: No  . Drug use: No  . Sexual activity: Never  Other Topics Concern  . Not on file  Social History Narrative  . Not on file   Social Determinants of Health   Financial Resource Strain: Not on file  Food Insecurity: Not on file  Transportation Needs: Not on file  Physical Activity: Not on file  Stress: Not on file  Social Connections: Not on file  Intimate Partner Violence: Not on file     Review of Systems: General: negative for chills, fever, night sweats or weight changes.  Cardiovascular: negative for chest pain, dyspnea on exertion, edema, orthopnea, palpitations, paroxysmal nocturnal dyspnea or shortness of breath Dermatological: negative for rash Respiratory: negative for cough or wheezing Urologic: negative for hematuria Abdominal: negative for nausea, vomiting, diarrhea, bright red blood per rectum, melena, or hematemesis Neurologic: negative for visual changes, syncope, or dizziness All other systems reviewed and are otherwise negative except as noted above.    Blood pressure 104/70, pulse (!) 59, height 5' 1.5" (1.562 m), weight 225 lb (102.1 kg).  General appearance: alert and no distress Neck: no adenopathy, no carotid bruit, no JVD, supple, symmetrical, trachea midline and thyroid not enlarged, symmetric, no tenderness/mass/nodules Lungs: clear to auscultation bilaterally Heart: regular rate and rhythm, S1, S2 normal, no murmur, click, rub or gallop Extremities: extremities normal, atraumatic, no cyanosis or edema Pulses: 2+ and symmetric Skin: Skin color, texture, turgor normal. No rashes or lesions Neurologic: Alert and oriented X 3, normal strength and tone. Normal symmetric reflexes. Normal coordination and gait  EKG sinus bradycardia 59 with left atrial enlargement and nonspecific ST and T wave changes.  I personally reviewed this EKG.  ASSESSMENT AND PLAN:    Paroxysmal atrial fibrillation History of PAF maintaining sinus rhythm on Eliquis oral anticoagulation.  Hyperlipidemia History of hyper lipidemia intolerant to Zetia and not willing to take statin therapy with a recent fasting lipid profile performed 08/09/2020 revealing total cholesterol 205, LDL 134 and HDL of 38.  In addition, she did have a coronary calcium score of 141 measured on 10/26/2019.  She has seen Dr. Rennis Golden who had raised the possibility of PCSK9 and she is willing to agree entertain this.  Obstructive sleep apnea History of obstructive sleep apnea on CPAP which she wears religiously.  Morbid obesity (HCC) History of morbid obesity with a BMI of 42.  She is willing to entertain weight loss through diet and wellness center.  I am referring her to Dr. Dalbert Garnet for further evaluation.      [name] FACP,FACC,FAHA, [facility] 03/12/2021 1:33 PM

## 2021-03-12 NOTE — Assessment & Plan Note (Signed)
History of PAF maintaining sinus rhythm on Eliquis oral anticoagulation. 

## 2021-03-12 NOTE — Assessment & Plan Note (Signed)
History of morbid obesity with a BMI of 42.  She is willing to entertain weight loss through diet and wellness center.  I am referring her to Dr. Dalbert Garnet for further evaluation.

## 2021-03-12 NOTE — Patient Instructions (Signed)

## 2021-04-08 DIAGNOSIS — M62830 Muscle spasm of back: Secondary | ICD-10-CM | POA: Diagnosis not present

## 2021-04-08 DIAGNOSIS — M9903 Segmental and somatic dysfunction of lumbar region: Secondary | ICD-10-CM | POA: Diagnosis not present

## 2021-04-08 DIAGNOSIS — M5442 Lumbago with sciatica, left side: Secondary | ICD-10-CM | POA: Diagnosis not present

## 2021-04-08 DIAGNOSIS — M9904 Segmental and somatic dysfunction of sacral region: Secondary | ICD-10-CM | POA: Diagnosis not present

## 2021-04-08 DIAGNOSIS — M5136 Other intervertebral disc degeneration, lumbar region: Secondary | ICD-10-CM | POA: Diagnosis not present

## 2021-04-08 DIAGNOSIS — M5137 Other intervertebral disc degeneration, lumbosacral region: Secondary | ICD-10-CM | POA: Diagnosis not present

## 2021-04-10 DIAGNOSIS — M5442 Lumbago with sciatica, left side: Secondary | ICD-10-CM | POA: Diagnosis not present

## 2021-04-10 DIAGNOSIS — M5137 Other intervertebral disc degeneration, lumbosacral region: Secondary | ICD-10-CM | POA: Diagnosis not present

## 2021-04-10 DIAGNOSIS — M9904 Segmental and somatic dysfunction of sacral region: Secondary | ICD-10-CM | POA: Diagnosis not present

## 2021-04-10 DIAGNOSIS — M9903 Segmental and somatic dysfunction of lumbar region: Secondary | ICD-10-CM | POA: Diagnosis not present

## 2021-04-10 DIAGNOSIS — M5136 Other intervertebral disc degeneration, lumbar region: Secondary | ICD-10-CM | POA: Diagnosis not present

## 2021-04-10 DIAGNOSIS — M62830 Muscle spasm of back: Secondary | ICD-10-CM | POA: Diagnosis not present

## 2021-04-15 DIAGNOSIS — M5442 Lumbago with sciatica, left side: Secondary | ICD-10-CM | POA: Diagnosis not present

## 2021-04-15 DIAGNOSIS — M5137 Other intervertebral disc degeneration, lumbosacral region: Secondary | ICD-10-CM | POA: Diagnosis not present

## 2021-04-15 DIAGNOSIS — M62830 Muscle spasm of back: Secondary | ICD-10-CM | POA: Diagnosis not present

## 2021-04-15 DIAGNOSIS — M9904 Segmental and somatic dysfunction of sacral region: Secondary | ICD-10-CM | POA: Diagnosis not present

## 2021-04-15 DIAGNOSIS — M9903 Segmental and somatic dysfunction of lumbar region: Secondary | ICD-10-CM | POA: Diagnosis not present

## 2021-04-15 DIAGNOSIS — M5136 Other intervertebral disc degeneration, lumbar region: Secondary | ICD-10-CM | POA: Diagnosis not present

## 2021-04-16 DIAGNOSIS — M5137 Other intervertebral disc degeneration, lumbosacral region: Secondary | ICD-10-CM | POA: Diagnosis not present

## 2021-04-16 DIAGNOSIS — M5442 Lumbago with sciatica, left side: Secondary | ICD-10-CM | POA: Diagnosis not present

## 2021-04-16 DIAGNOSIS — M9903 Segmental and somatic dysfunction of lumbar region: Secondary | ICD-10-CM | POA: Diagnosis not present

## 2021-04-16 DIAGNOSIS — M5136 Other intervertebral disc degeneration, lumbar region: Secondary | ICD-10-CM | POA: Diagnosis not present

## 2021-04-16 DIAGNOSIS — M9904 Segmental and somatic dysfunction of sacral region: Secondary | ICD-10-CM | POA: Diagnosis not present

## 2021-04-16 DIAGNOSIS — M62830 Muscle spasm of back: Secondary | ICD-10-CM | POA: Diagnosis not present

## 2021-04-17 DIAGNOSIS — M62830 Muscle spasm of back: Secondary | ICD-10-CM | POA: Diagnosis not present

## 2021-04-17 DIAGNOSIS — M5137 Other intervertebral disc degeneration, lumbosacral region: Secondary | ICD-10-CM | POA: Diagnosis not present

## 2021-04-17 DIAGNOSIS — M5136 Other intervertebral disc degeneration, lumbar region: Secondary | ICD-10-CM | POA: Diagnosis not present

## 2021-04-17 DIAGNOSIS — M5442 Lumbago with sciatica, left side: Secondary | ICD-10-CM | POA: Diagnosis not present

## 2021-04-17 DIAGNOSIS — M9904 Segmental and somatic dysfunction of sacral region: Secondary | ICD-10-CM | POA: Diagnosis not present

## 2021-04-17 DIAGNOSIS — M9903 Segmental and somatic dysfunction of lumbar region: Secondary | ICD-10-CM | POA: Diagnosis not present

## 2021-04-18 ENCOUNTER — Telehealth (INDEPENDENT_AMBULATORY_CARE_PROVIDER_SITE_OTHER): Payer: Medicare Other | Admitting: Internal Medicine

## 2021-04-18 ENCOUNTER — Encounter: Payer: Self-pay | Admitting: Internal Medicine

## 2021-04-18 VITALS — BP 155/84 | HR 59 | Wt 230.0 lb

## 2021-04-18 DIAGNOSIS — Z8249 Family history of ischemic heart disease and other diseases of the circulatory system: Secondary | ICD-10-CM | POA: Diagnosis not present

## 2021-04-18 DIAGNOSIS — E782 Mixed hyperlipidemia: Secondary | ICD-10-CM

## 2021-04-18 NOTE — Patient Instructions (Signed)
Medication Instructions:  NO CHANGES  *If you need a refill on your cardiac medications before your next appointment, please call your pharmacy*   Follow-Up: At [facility], you and your health needs are our priority.  As part of our continuing mission to provide you with exceptional heart care, we have created designated Provider Care Teams.  These Care Teams include your primary Cardiologist (physician) and Advanced Practice Providers (APPs -  Physician Assistants and Nurse Practitioners) who all work together to provide you with the care you need, when you need it.  We recommend signing up for the patient portal called "MyChart".  Sign up information is provided on this After Visit Summary.  MyChart is used to connect with patients for Virtual Visits (Telemedicine).  Patients are able to view lab/test results, encounter notes, upcoming appointments, etc.  Non-urgent messages can be sent to your provider as well.   To learn more about what you can do with MyChart, go to [email or web address].    Your next appointment:   AS NEEDED with Dr. Hilty for lipid management   

## 2021-04-18 NOTE — Progress Notes (Signed)
Virtual Visit via Video Note   This visit type was conducted due to national recommendations for restrictions regarding the COVID-19 Pandemic (e.g. social distancing) in an effort to limit this patient's exposure and mitigate transmission in our community.  Due to her co-morbid illnesses, this patient is at least at moderate risk for complications without adequate follow up.  This format is felt to be most appropriate for this patient at this time.  The patient does have access to video technology.  All issues noted in this document were discussed and addressed.  A limited physical exam could be performed with this format.  Please refer to the patient's chart for her  consent to telehealth for [facility].   Evaluation Performed: Video visit  Date:  04/18/2021   ID:  Shannon Griffin, DOB 08/27/1946, MRN [id number]  Patient Location:  [address] [place] [address]  Provider location:   [address] [place], [place] [address]  PCP:  Shannon Saupe, MD  Cardiologist:  Shannon Batty, MD Electrophysiologist:  None   Chief Complaint: Follow-up dyslipidemia  History of Present Illness:    Shannon Griffin is a 75 y.o. female who presents via audio/video conferencing for a telehealth visit today.  This is a 75 year old female with a history of dyslipidemia, atrial fibrillation, and OSA on CPAP which is well controlled.  She is referred by Shannon Griffin for evaluation management of dyslipidemia.  Heart disease but unfortunately she has been intolerant of a number of medications and other products over the years.  She related to me today that her brother had died and an autopsy report indicated that the statin was somehow implicated in his death.  Based on this she is terrified of statin medications and is really not interested in trying them.  This makes options for lipid therapy quite difficult as most of the more advanced lipid therapies require failure medication.  More  recently her lipids indicated 207, triglycerides 179, HDL 44 and LDL 131.  Based on her risk factors I would target her to at least an LDL less than 100, however she did in January 2021 which was 141, 68th percentile for age and sex matched control.  Aortic root atherosclerosis was also noted.  Based on these findings I would be more aggressively target LDL less than 70.  After discussing this, however she is quite concerned about side effects as well.   08/21/2020  Shannon Griffin is seen today via telephone follow-up.  She never started the ezetimibe because she has a history of thyroid disease and some hyperglycemia and she read that it could worsen that.  She also would not take the medicine to help her lose weight that was prescribed by her PCP which she said later was recalled due to issues that it might cause cancer.  She says she has little faith in the medical system and in medications.  She also told me that she had some friends who are nurses that suggested that the lipid profile was not accurate in older adults.  Based on that it does not sound like she is interested in additional medications although she may qualify for them.  I advised her that she does have age advanced coronary disease and significant risk factors for stroke and heart disease.  She remains above target LDL therapy and does have higher than expected risk based on her dyslipidemia.  04/18/2021  Shannon Griffin is seen today in follow-up via a video visit.Marland Kitchen  She recently saw Shannon Griffin and was noted not to be taking any lipid-lowering therapies.  The ezetimibe was not tolerable.  She is adamant about not taking statins as described above.  Based on this she would not qualify for PCSK9 inhibitor.  She is really reticent about trying a statin because her brother who passed away had autopsy findings suggesting that the statin contributed to his death.  This is very unusual and incredibly unlikely however she said that she cannot mentally get  to a point where she could feel comfortable taking statin medications.  I think it is therefore unlikely that insurance would cover a PCSK9 inhibitor for this indication  The patient does not have symptoms concerning for COVID-19 infection (fever, chills, cough, or new SHORTNESS OF BREATH).    Prior CV studies:   The following studies were reviewed today:  Reviewed, labs reviewed  PMHx:  Past Medical History:  Diagnosis Date   Atrial fibrillation (HCC) 06/27/2011   Echo - EF 55-60%, LV wall thickness mildly increased; mod/severe LA dilation; estimated pulm arterial systolic pressure elevated at 16-10RUEA   Heart murmur    Hyperlipidemia    Hypothyroid    OSA on CPAP 10/25/2012   ahi 1.5    Past Surgical History:  Procedure Laterality Date   CHOLECYSTECTOMY     TONSILLECTOMY      FAMHx:  Family History  Problem Relation Age of Onset   Atrial fibrillation Mother    Atrial fibrillation Brother     SOCHx:   reports that she has never smoked. She has never used smokeless tobacco. She reports that she does not drink alcohol and does not use drugs.  ALLERGIES:  Allergies  Allergen Reactions   Freeze It [Camphor-Menthol]     Histofreeze   Lidocaine Swelling    MEDS:  Current Meds  Medication Sig   bisoprolol (ZEBETA) 5 MG tablet TAKE 1/2 TABLET EVERY      MORNING AND 1 TABLET EVERY EVENING *UNICHEM MFR*   ELDERBERRY PO Take 1 tablet by mouth daily.   ELIQUIS 5 MG TABS tablet TAKE 1 TABLET TWICE A DAY   levothyroxine (SYNTHROID, LEVOTHROID) 112 MCG tablet Take 112 mcg by mouth daily before breakfast. Take 1 tablet 6 days a week and half tablet on 7th day   Multiple Vitamins-Minerals (MULTIVITAMIN WITH MINERALS) tablet Take 1 tablet by mouth daily.   vitamin C (ASCORBIC ACID) 500 MG tablet Take 500 mg by mouth daily.   vitamin E 400 UNIT capsule Take 400 Units by mouth daily.     ROS: Pertinent items noted in HPI and remainder of comprehensive ROS otherwise  negative.  Labs/Other Tests and Data Reviewed:    Recent Labs: No results found for requested labs within last 8760 hours.   Recent Lipid Panel Lab Results  Component Value Date/Time   CHOL 205 (H) 08/09/2020 08:37 AM   TRIG 182 (H) 08/09/2020 08:37 AM   HDL 38 (L) 08/09/2020 08:37 AM   CHOLHDL 5.4 (H) 08/09/2020 08:37 AM   LDLCALC 134 (H) 08/09/2020 08:37 AM    Wt Readings from Last 3 Encounters:  04/18/21 230 lb (104.3 kg)  03/12/21 225 lb (102.1 kg)  09/11/20 235 lb 12.8 oz (107 kg)     Exam:    Vital Signs:  BP (!) 155/84   Pulse (!) 59   Wt 230 lb (104.3 kg)   BMI 42.75 kg/m    General appearance: alert and no distress Lungs: No visual respiratory difficulty  Abdomen: Morbidly obese Extremities: extremities normal, atraumatic, no cyanosis or edema Skin: Skin color, texture, turgor normal. No rashes or lesions Neurologic: Grossly normal Psych: Mildly anxious  ASSESSMENT & PLAN:    Mixed dyslipidemia, goal LDL less than 70 Statin intolerant-myalgias Abnormal CAC score of 141, 68th percentile for age and sex matched control Aortic root atherosclerosis Family history of coronary disease Atrial fibrillation on Eliquis  Shannon Griffin again is not interested in trying statin therapy.  Ezetimibe would not be covered because she did not try statin therapy and therefore PCSK9 inhibitors are unlikely to be covered as well.  Most likely she would need 40 mg atorvastatin to reach target.  We talked about potential side effects of the medication.  She said she was willing to think about it and would reach back out to [place] if she was interested in proceeding.  Otherwise would not recommend any further follow-up in the lipid clinic.   COVID-19 Education: The signs and symptoms of COVID-19 were discussed with the patient and how to seek care for testing (follow up with PCP or arrange E-visit).  The importance of social distancing was discussed today.  Patient Risk:   After full  review of this patients clinical status, I feel that they are at least moderate risk at this time.  Time:   Today, I have spent 15 minutes with the patient with telehealth technology discussing dyslipidemia, statin intolerance, cardiovascular risk factors, alternatives to statin therapy.     Medication Adjustments/Labs and Tests Ordered: Current medicines are reviewed at length with the patient today.  Concerns regarding medicines are outlined above.   Tests Ordered: No orders of the defined types were placed in this encounter.   Medication Changes: No orders of the defined types were placed in this encounter.   Disposition:  prn  Chrystie Nose, MD, Milagros Loll  [place]  [facility]  Medical Director of the Advanced Lipid Disorders &  Cardiovascular Risk Reduction Clinic Diplomate of the American Board of Clinical Lipidology Attending Cardiologist  Direct Dial: [phone]  Fax: [phone]  Website:  [email or web address]  Chrystie Nose, MD  04/18/2021 9:02 AM

## 2021-04-19 ENCOUNTER — Telehealth: Payer: Self-pay | Admitting: Cardiovascular Disease

## 2021-04-19 DIAGNOSIS — M9903 Segmental and somatic dysfunction of lumbar region: Secondary | ICD-10-CM | POA: Diagnosis not present

## 2021-04-19 DIAGNOSIS — M9904 Segmental and somatic dysfunction of sacral region: Secondary | ICD-10-CM | POA: Diagnosis not present

## 2021-04-19 DIAGNOSIS — M5136 Other intervertebral disc degeneration, lumbar region: Secondary | ICD-10-CM | POA: Diagnosis not present

## 2021-04-19 DIAGNOSIS — M5442 Lumbago with sciatica, left side: Secondary | ICD-10-CM | POA: Diagnosis not present

## 2021-04-19 DIAGNOSIS — M62830 Muscle spasm of back: Secondary | ICD-10-CM | POA: Diagnosis not present

## 2021-04-19 DIAGNOSIS — M5137 Other intervertebral disc degeneration, lumbosacral region: Secondary | ICD-10-CM | POA: Diagnosis not present

## 2021-04-19 NOTE — Telephone Encounter (Signed)
Should be fine for her to take, but may cause sedation

## 2021-04-19 NOTE — Telephone Encounter (Signed)
     Pt c/o medication issue:  1. Name of Medication: valerian with passiflora  2. How are you currently taking this medication (dosage and times per day)?   3. Are you having a reaction (difficulty breathing--STAT)?   4. What is your medication issue? Pt had a pinch nerve and her chiropractor advised her to take valerian with passiflora, she wanted to make sure it is ok to take this with her heart meds, she is hoping to get an answer today because she's in agony

## 2021-04-19 NOTE — Telephone Encounter (Signed)
Will route to PharmD to advise of medication.   Thank you!

## 2021-04-19 NOTE — Telephone Encounter (Signed)
Called patient, advised of message from PharmD.  Patient verbalized understanding.   

## 2021-04-24 DIAGNOSIS — M9904 Segmental and somatic dysfunction of sacral region: Secondary | ICD-10-CM | POA: Diagnosis not present

## 2021-04-24 DIAGNOSIS — M5136 Other intervertebral disc degeneration, lumbar region: Secondary | ICD-10-CM | POA: Diagnosis not present

## 2021-04-24 DIAGNOSIS — M5442 Lumbago with sciatica, left side: Secondary | ICD-10-CM | POA: Diagnosis not present

## 2021-04-24 DIAGNOSIS — M5137 Other intervertebral disc degeneration, lumbosacral region: Secondary | ICD-10-CM | POA: Diagnosis not present

## 2021-04-24 DIAGNOSIS — M62830 Muscle spasm of back: Secondary | ICD-10-CM | POA: Diagnosis not present

## 2021-04-24 DIAGNOSIS — M9903 Segmental and somatic dysfunction of lumbar region: Secondary | ICD-10-CM | POA: Diagnosis not present

## 2021-04-26 DIAGNOSIS — M5137 Other intervertebral disc degeneration, lumbosacral region: Secondary | ICD-10-CM | POA: Diagnosis not present

## 2021-04-26 DIAGNOSIS — M5136 Other intervertebral disc degeneration, lumbar region: Secondary | ICD-10-CM | POA: Diagnosis not present

## 2021-04-26 DIAGNOSIS — M9903 Segmental and somatic dysfunction of lumbar region: Secondary | ICD-10-CM | POA: Diagnosis not present

## 2021-04-26 DIAGNOSIS — M62830 Muscle spasm of back: Secondary | ICD-10-CM | POA: Diagnosis not present

## 2021-04-26 DIAGNOSIS — M5442 Lumbago with sciatica, left side: Secondary | ICD-10-CM | POA: Diagnosis not present

## 2021-04-26 DIAGNOSIS — M9904 Segmental and somatic dysfunction of sacral region: Secondary | ICD-10-CM | POA: Diagnosis not present

## 2021-04-29 DIAGNOSIS — M5137 Other intervertebral disc degeneration, lumbosacral region: Secondary | ICD-10-CM | POA: Diagnosis not present

## 2021-04-29 DIAGNOSIS — M5442 Lumbago with sciatica, left side: Secondary | ICD-10-CM | POA: Diagnosis not present

## 2021-04-29 DIAGNOSIS — M9904 Segmental and somatic dysfunction of sacral region: Secondary | ICD-10-CM | POA: Diagnosis not present

## 2021-04-29 DIAGNOSIS — M62830 Muscle spasm of back: Secondary | ICD-10-CM | POA: Diagnosis not present

## 2021-04-29 DIAGNOSIS — M5136 Other intervertebral disc degeneration, lumbar region: Secondary | ICD-10-CM | POA: Diagnosis not present

## 2021-04-29 DIAGNOSIS — M9903 Segmental and somatic dysfunction of lumbar region: Secondary | ICD-10-CM | POA: Diagnosis not present

## 2021-04-30 DIAGNOSIS — I10 Essential (primary) hypertension: Secondary | ICD-10-CM | POA: Diagnosis not present

## 2021-04-30 DIAGNOSIS — H35033 Hypertensive retinopathy, bilateral: Secondary | ICD-10-CM | POA: Diagnosis not present

## 2021-04-30 DIAGNOSIS — H25813 Combined forms of age-related cataract, bilateral: Secondary | ICD-10-CM | POA: Diagnosis not present

## 2021-05-01 DIAGNOSIS — M5136 Other intervertebral disc degeneration, lumbar region: Secondary | ICD-10-CM | POA: Diagnosis not present

## 2021-05-01 DIAGNOSIS — M5442 Lumbago with sciatica, left side: Secondary | ICD-10-CM | POA: Diagnosis not present

## 2021-05-01 DIAGNOSIS — M9903 Segmental and somatic dysfunction of lumbar region: Secondary | ICD-10-CM | POA: Diagnosis not present

## 2021-05-01 DIAGNOSIS — M9904 Segmental and somatic dysfunction of sacral region: Secondary | ICD-10-CM | POA: Diagnosis not present

## 2021-05-01 DIAGNOSIS — M62830 Muscle spasm of back: Secondary | ICD-10-CM | POA: Diagnosis not present

## 2021-05-01 DIAGNOSIS — M5137 Other intervertebral disc degeneration, lumbosacral region: Secondary | ICD-10-CM | POA: Diagnosis not present

## 2021-05-03 DIAGNOSIS — M9903 Segmental and somatic dysfunction of lumbar region: Secondary | ICD-10-CM | POA: Diagnosis not present

## 2021-05-03 DIAGNOSIS — M5136 Other intervertebral disc degeneration, lumbar region: Secondary | ICD-10-CM | POA: Diagnosis not present

## 2021-05-03 DIAGNOSIS — M62830 Muscle spasm of back: Secondary | ICD-10-CM | POA: Diagnosis not present

## 2021-05-03 DIAGNOSIS — M9904 Segmental and somatic dysfunction of sacral region: Secondary | ICD-10-CM | POA: Diagnosis not present

## 2021-05-03 DIAGNOSIS — M5442 Lumbago with sciatica, left side: Secondary | ICD-10-CM | POA: Diagnosis not present

## 2021-05-03 DIAGNOSIS — M5137 Other intervertebral disc degeneration, lumbosacral region: Secondary | ICD-10-CM | POA: Diagnosis not present

## 2021-05-06 DIAGNOSIS — M62830 Muscle spasm of back: Secondary | ICD-10-CM | POA: Diagnosis not present

## 2021-05-06 DIAGNOSIS — M5442 Lumbago with sciatica, left side: Secondary | ICD-10-CM | POA: Diagnosis not present

## 2021-05-06 DIAGNOSIS — M5136 Other intervertebral disc degeneration, lumbar region: Secondary | ICD-10-CM | POA: Diagnosis not present

## 2021-05-06 DIAGNOSIS — M9903 Segmental and somatic dysfunction of lumbar region: Secondary | ICD-10-CM | POA: Diagnosis not present

## 2021-05-06 DIAGNOSIS — M9904 Segmental and somatic dysfunction of sacral region: Secondary | ICD-10-CM | POA: Diagnosis not present

## 2021-05-06 DIAGNOSIS — M5137 Other intervertebral disc degeneration, lumbosacral region: Secondary | ICD-10-CM | POA: Diagnosis not present

## 2021-05-08 DIAGNOSIS — M9904 Segmental and somatic dysfunction of sacral region: Secondary | ICD-10-CM | POA: Diagnosis not present

## 2021-05-08 DIAGNOSIS — M5136 Other intervertebral disc degeneration, lumbar region: Secondary | ICD-10-CM | POA: Diagnosis not present

## 2021-05-08 DIAGNOSIS — M62830 Muscle spasm of back: Secondary | ICD-10-CM | POA: Diagnosis not present

## 2021-05-08 DIAGNOSIS — M9903 Segmental and somatic dysfunction of lumbar region: Secondary | ICD-10-CM | POA: Diagnosis not present

## 2021-05-08 DIAGNOSIS — M5442 Lumbago with sciatica, left side: Secondary | ICD-10-CM | POA: Diagnosis not present

## 2021-05-08 DIAGNOSIS — M5137 Other intervertebral disc degeneration, lumbosacral region: Secondary | ICD-10-CM | POA: Diagnosis not present

## 2021-05-09 ENCOUNTER — Other Ambulatory Visit: Payer: Self-pay | Admitting: Cardiovascular Disease

## 2021-05-10 DIAGNOSIS — M62830 Muscle spasm of back: Secondary | ICD-10-CM | POA: Diagnosis not present

## 2021-05-10 DIAGNOSIS — M5442 Lumbago with sciatica, left side: Secondary | ICD-10-CM | POA: Diagnosis not present

## 2021-05-10 DIAGNOSIS — M9903 Segmental and somatic dysfunction of lumbar region: Secondary | ICD-10-CM | POA: Diagnosis not present

## 2021-05-10 DIAGNOSIS — M5137 Other intervertebral disc degeneration, lumbosacral region: Secondary | ICD-10-CM | POA: Diagnosis not present

## 2021-05-10 DIAGNOSIS — M9904 Segmental and somatic dysfunction of sacral region: Secondary | ICD-10-CM | POA: Diagnosis not present

## 2021-05-10 DIAGNOSIS — M5136 Other intervertebral disc degeneration, lumbar region: Secondary | ICD-10-CM | POA: Diagnosis not present

## 2021-05-13 DIAGNOSIS — M5442 Lumbago with sciatica, left side: Secondary | ICD-10-CM | POA: Diagnosis not present

## 2021-05-13 DIAGNOSIS — M9904 Segmental and somatic dysfunction of sacral region: Secondary | ICD-10-CM | POA: Diagnosis not present

## 2021-05-13 DIAGNOSIS — M9903 Segmental and somatic dysfunction of lumbar region: Secondary | ICD-10-CM | POA: Diagnosis not present

## 2021-05-13 DIAGNOSIS — M62830 Muscle spasm of back: Secondary | ICD-10-CM | POA: Diagnosis not present

## 2021-05-13 DIAGNOSIS — M5136 Other intervertebral disc degeneration, lumbar region: Secondary | ICD-10-CM | POA: Diagnosis not present

## 2021-05-13 DIAGNOSIS — M5137 Other intervertebral disc degeneration, lumbosacral region: Secondary | ICD-10-CM | POA: Diagnosis not present

## 2021-05-14 ENCOUNTER — Ambulatory Visit (INDEPENDENT_AMBULATORY_CARE_PROVIDER_SITE_OTHER): Payer: Medicare Other | Admitting: Cardiovascular Disease

## 2021-05-14 ENCOUNTER — Other Ambulatory Visit: Payer: Self-pay

## 2021-05-14 ENCOUNTER — Encounter: Payer: Self-pay | Admitting: Cardiovascular Disease

## 2021-05-14 VITALS — BP 118/64 | HR 60 | Ht 61.5 in | Wt 238.4 lb

## 2021-05-14 DIAGNOSIS — I480 Paroxysmal atrial fibrillation: Secondary | ICD-10-CM

## 2021-05-14 DIAGNOSIS — Z8639 Personal history of other endocrine, nutritional and metabolic disease: Secondary | ICD-10-CM

## 2021-05-14 DIAGNOSIS — G4733 Obstructive sleep apnea (adult) (pediatric): Secondary | ICD-10-CM | POA: Diagnosis not present

## 2021-05-14 DIAGNOSIS — I10 Essential (primary) hypertension: Secondary | ICD-10-CM

## 2021-05-14 NOTE — Progress Notes (Signed)
[facility]   Cardiology Office Note    Date:  05/16/2021   ID:  Kestrel Mis, DOB 15-Mar-1946, MRN [id number]  PCP:  Angelina Sheriff, MD  Cardiologist:  Shelva Majestic, MD (sleep); Dr. Gwenlyn Found  No chief complaint on file.   History of Present Illness:  Shannon Griffin is a 75 y.o. female who presents to sleep clinic for a one year follow-up evaluation of her obstructive sleep apnea.   Shannon Griffin is followed by Dr. Gwenlyn Found for cardiology care.  She has a history of obstructive sleep apnea which was diagnosed in 2013 and had an AHI of 6.2, an RDI of 10.5, but during rim sleep sleep apnea was moderate with an HI of 23 and RDI of 37.4.  She has a history of paroxysmal atrial fibrillation.  She initiated.  CPAP at 9 cm water pressure with elimination of snoring.  She was last seen by me 5 years ago and she was compliant at her 47 m water pressure with residual AHI at 1.5.   Over the last several years, she is continued with CPAP use.  He brought her machine to the office today with her in the office.  This was interrogated.  She continues to be compliant with 100% use with average 30 day therapy at 8.24 hours.  Her AHI was 2.0.  Recently she has noticed that her CPAP machine is making significant noise and beginning to malfunction.  She has been using an AirFit PT 10 mask and is unaware of any significant leak.  Because of machine malfunction she now presents for evaluation.   She qualified for a new CPAP machine and received a new ResMed air sense 10 CPAP unit with a set up date on February 02, 2018.  Her DME company is [employer] in [place] and apparently [place] so involved.    She noticed a significant improvement with her new machine compared to the old one.  A download was obtained from May 17, 2018 through June 15, 2018.  She is 100% compliant and averaging 8 hours and 43 minutes of sleep per night.  She is set at a pressure of 9 cm.  AHI is excellent at 0.9.  She has an ResMed AirFit  P10 mask.  There is no leak.  She denies residual daytime sleepiness.  An Epworth Sleepiness Scale score was calculated in the office today and this endorsed at 7.   I last evaluated her in a telemedicine encounter in June 2021 and since her prior evaluation  continued to see Dr. Gwenlyn Found and  saw him in May 2021.  She was maintaining sinus rhythm without recurrent atrial fibrillation.  She was seeing Dr. Eden Emms who follows her multinodular goiter.  She has continued to use CPAP and has been using a pillow mask.  This at times causes nasal irritation.  I obtained a download today from 06/18/2020 through April 17, 2019 which confirms 100% compliance with average usage 8 hours and 21 minutes.  At a 9 cm set pressure, AHI is excellent at 0.8.  During that evaluation I suggested she try the ResMed AirFit N 30i mask which should not cause nasal irritation from the pillows.  Over the past year, she has continued to do well.  She did try the under the nose nasal mask but she felt that it was difficult to control a leak and for this reason she really resumed using her old nasal pillow masks.  I obtained a download in the office  today from June 26 through May 13, 2021 which confirms excellent compliance.  Average sleep duration is 8 hours and 53 minutes.  At 9 cm set pressure, AHI is excellent at 0.6/h.  I calculated an Epworth Sleepiness Scale score in the office today and this endorsed at 4 arguing against residual daytime sleepiness.  Presently she feels well.  She denies any chest pain or shortness of breath.  She last was evaluated by Dr. Gwenlyn Found on Mar 12, 2021.  She has been maintaining sinus rhythm.  She presents for evaluation.  Past Medical History:  Diagnosis Date   Atrial fibrillation ([place]) 06/27/2011   Echo - EF 55-60%, LV wall thickness mildly increased; mod/severe LA dilation; estimated pulm arterial systolic pressure elevated at 30-9mHg   Heart murmur    Hyperlipidemia    Hypothyroid    OSA on CPAP  10/25/2012   ahi 1.5    Past Surgical History:  Procedure Laterality Date   CHOLECYSTECTOMY     TONSILLECTOMY      Current Medications: Outpatient Medications Prior to Visit  Medication Sig Dispense Refill   bisoprolol (ZEBETA) 5 MG tablet TAKE 1/2 TABLET EVERY      MORNING AND 1 TABLET EVERY EVENING *UNICHEM MFR* 135 tablet 2   ELDERBERRY PO Take 1 tablet by mouth daily.     ELIQUIS 5 MG TABS tablet TAKE 1 TABLET TWICE A DAY 180 tablet 1   KRILL OIL PO Take by mouth. Takes occasionally     levothyroxine (SYNTHROID, LEVOTHROID) 112 MCG tablet Take 112 mcg by mouth daily before breakfast. Take 1 tablet 6 days a week and half tablet on 7th day     Multiple Vitamins-Minerals (MULTIVITAMIN WITH MINERALS) tablet Take 1 tablet by mouth daily.     VALERIAN PO Take by mouth.     vitamin C (ASCORBIC ACID) 500 MG tablet Take 500 mg by mouth daily.     vitamin E 400 UNIT capsule Take 400 Units by mouth daily.     No facility-administered medications prior to visit.     Allergies:   Freeze it [camphor-menthol] and Lidocaine   Social History   Socioeconomic History   Marital status: Widowed    Spouse name: Not on file   Number of children: Not on file   Years of education: Not on file   Highest education level: Not on file  Occupational History   Not on file  Tobacco Use   Smoking status: Never   Smokeless tobacco: Never  Substance and Sexual Activity   Alcohol use: No   Drug use: No   Sexual activity: Never  Other Topics Concern   Not on file  Social History Narrative   Not on file   Social Determinants of Health   Financial Resource Strain: Not on file  Food Insecurity: Not on file  Transportation Needs: Not on file  Physical Activity: Not on file  Stress: Not on file  Social Connections: Not on file     Family History:  The patient's family history includes Atrial fibrillation in her brother and mother.   ROS General: Negative; No fevers, chills, or night sweats;   HEENT: Negative; No changes in vision or hearing, sinus congestion, difficulty swallowing Pulmonary: Negative; No cough, wheezing, shortness of breath, hemoptysis Cardiovascular: History of PAF, maintaining sinus rhythm GI: Negative; No nausea, vomiting, diarrhea, or abdominal pain GU: Negative; No dysuria, hematuria, or difficulty voiding Musculoskeletal: Negative; no myalgias, joint pain, or weakness Hematologic/Oncology: Negative; no easy bruising,  bleeding Endocrine: Negative; no heat/cold intolerance; no diabetes Neuro: Negative; no changes in balance, headaches Skin: Negative; No rashes or skin lesions Psychiatric: Negative; No behavioral problems, depression Sleep: OSA on CPAP, no snoring, no daytime sleepiness.  DME company is [employer] Other comprehensive 14 point system review is negative.   PHYSICAL EXAM:   VS:  BP 118/64   Pulse 60   Ht 5' 1.5" (1.562 m)   Wt 238 lb 6.4 oz (108.1 kg)   SpO2 99%   BMI 44.32 kg/m     Repeat blood pressure by me was 128/70  Wt Readings from Last 3 Encounters:  05/14/21 238 lb 6.4 oz (108.1 kg)  04/18/21 230 lb (104.3 kg)  03/12/21 225 lb (102.1 kg)    General: Alert, oriented, no distress.  Skin: normal turgor, no rashes, warm and dry HEENT: Normocephalic, atraumatic. Pupils equal round and reactive to light; sclera anicteric; extraocular muscles intact;  Nose without nasal septal hypertrophy Mouth/Parynx benign; Mallinpatti scale Neck: No JVD, no carotid bruits; normal carotid upstroke Lungs: clear to ausculatation and percussion; no wheezing or rales Chest wall: without tenderness to palpitation Heart: PMI not displaced, RRR, s1 s2 normal, 1/6 systolic murmur, no diastolic murmur, no rubs, gallops, thrills, or heaves Abdomen: soft, nontender; no hepatosplenomehaly, BS+; abdominal aorta nontender and not dilated by palpation. Back: no CVA tenderness Pulses 2+ Musculoskeletal: full range of motion, normal strength, no joint  deformities Extremities: no clubbing cyanosis or edema, Homan's sign negative  Neurologic: grossly nonfocal; Cranial nerves grossly wnl Psychologic: Normal mood and affect   Studies/Labs Reviewed:   EKG:  EKG is ordered today.  ECG (independently read by me):  Sinus rhyhm at 60, isolated PAC, nonspecific T wave abnormality; normal intervals.  An ECG dated 03/06/2020 was personally reviewed today and demonstrated:  Normal sinus rhythm at 63 bpm with nonspecific T wave abnormality.  No ectopy.  Normal intervals.    Recent Labs: BMP Latest Ref Rng & Units 03/29/2019 12/04/2018 09/21/2018  Glucose 65 - 99 mg/dL 82 107(H) 84  BUN 8 - 27 mg/dL _0 Creatinine 0.57 - 1.00 mg/dL 0.87 1.02(H) 0.79  BUN/Creat Ratio 12 - 28 15 - 16  Sodium 134 - 144 mmol/L 144 136 141  Potassium 3.5 - 5.2 mmol/L 4.3 3.8 4.5  Chloride 96 - 106 mmol/L 104 101 102  CO2 20 - 29 mmol/L _1 Calcium 8.7 - 10.3 mg/dL 9.8 9.2 9.6     Hepatic Function Latest Ref Rng & Units 03/15/2020 10/11/2019  Total Protein 6.0 - 8.5 g/dL 6.7 6.1  Albumin 3.7 - 4.7 g/dL 4.2 4.0  AST 0 - 40 IU/L 18 12  ALT 0 - 32 IU/L 14 10  Alk Phosphatase 48 - 121 IU/L 72 80  Total Bilirubin 0.0 - 1.2 mg/dL 0.5 0.6  Bilirubin, Direct 0.00 - 0.40 mg/dL 0.13 0.13    CBC Latest Ref Rng & Units 12/04/2018 09/21/2018  WBC 4.0 - 10.5 K/uL 11.3(H) 8.2  Hemoglobin 12.0 - 15.0 g/dL 15.1(H) 13.4  Hematocrit 36.0 - 46.0 % 49.2(H) 41.6  Platelets 150 - 400 K/uL 211 211   Lab Results  Component Value Date   MCV 87.7 12/04/2018   MCV 84 09/21/2018   Lab Results  Component Value Date   TSH 0.496 03/29/2019   No results found for: HGBA1C   BNP No results found for: BNP  ProBNP No results found for: PROBNP   Lipid Panel     Component  Value Date/Time   CHOL 205 (H) 08/09/2020 0837   TRIG 182 (H) 08/09/2020 0837   HDL 38 (L) 08/09/2020 0837   CHOLHDL 5.4 (H) 08/09/2020 0837   LDLCALC 134 (H) 08/09/2020 0837   LABVLDL 33  08/09/2020 0837     RADIOLOGY: No results found.   Additional studies/ records that were reviewed today include:  I obtained a download from June 26 through May 13, 2021: 100% usage.  Average use 8 hours 53 minutes.  At a set pressure of 9 cm, AHI is excellent at 0.6/h.  No mask leak  ASSESSMENT:    1. Obstructive sleep apnea   2. Morbid obesity (HCC)   3. Paroxysmal atrial fibrillation ([place])   4. History of multinodular goiter    PLAN:  Shannon Griffin is a very pleasant 75 year old female who has documented obstructive sleep apnea dating back to her sleep study in 2013 which revealed an AHI of 0.2/h, RDI 10.5/h, but during REM sleep AHI was 23/h and RDI 37.4/h.  She has a history of paroxysmal atrial fibrillation, and essentially has been maintaining sinus rhythm.  Her most recent download attained in the office today confirms excellent compliance and excellent sleep duration at 8 hours and 53 minutes per night.  AHI is excellent at 0.6/h at a 9 cm set pressure.  Her blood pressure today is stable and she continues to be on bisoprolol 0.5 mg in the morning and 5 mg at night which is also helpful for her PAF.  She is anticoagulated on Eliquis and has not had bleeding.  She has hypothyroidism and continues to be on levothyroxine 112 mcg.  We have tried alternative mask but she seems to prefer the nasal pillows but at times does admit to some nasal irritation.  BMI is consistent with morbid obesity at 44.32.  I discussed the importance of weight loss and increased exercise if at all possible.  She continues to see Dr. Lovette Cliche for primary care.  She has a history of a multinodular goiter and had seen Dr. Eden Emms who has since retired.  I will see her in 1 year for follow-up evaluation or sooner as needed.   Medication Adjustments/Labs and Tests Ordered: Current medicines are reviewed at length with the patient today.  Concerns regarding medicines are outlined above.  Medication  changes, Labs and Tests ordered today are listed in the Patient Instructions below. Patient Instructions  Medication Instructions:  Your physician recommends that you continue on your current medications as directed. Please refer to the Current Medication list given to you today.  *If you need a refill on your cardiac medications before your next appointment, please call your pharmacy*   Lab Work: None ordered.   If you have labs (blood work) drawn today and your tests are completely normal, you will receive your results only by: [place] (if you have MyChart) OR A paper copy in the mail If you have any lab test that is abnormal or we need to change your treatment, we will call you to review the results.   Testing/Procedures: None ordered.    Follow-Up: At [facility], you and your health needs are our priority.  As part of our continuing mission to provide you with exceptional heart care, we have created designated Provider Care Teams.  These Care Teams include your primary Cardiologist (physician) and Advanced Practice Providers (APPs -  Physician Assistants and Nurse Practitioners) who all work together to provide you with the care you need,  when you need it.  We recommend signing up for the patient portal called "MyChart".  Sign up information is provided on this After Visit Summary.  MyChart is used to connect with patients for Virtual Visits (Telemedicine).  Patients are able to view lab/test results, encounter notes, upcoming appointments, etc.  Non-urgent messages can be sent to your provider as well.   To learn more about what you can do with MyChart, go to [email or web address].    Your next appointment:   12 month(s)  The format for your next appointment:   In Person  Provider:   Shelva Majestic, MD    Signed, Shelva Majestic, MD  05/16/2021 4:24 PM    [facility] [address] [place], [place], [place]  [address] Phone: [phone]

## 2021-05-14 NOTE — Patient Instructions (Signed)
Medication Instructions:  Your physician recommends that you continue on your current medications as directed. Please refer to the Current Medication list given to you today.  *If you need a refill on your cardiac medications before your next appointment, please call your pharmacy*   Lab Work: None ordered  If you have labs (blood work) drawn today and your tests are completely normal, you will receive your results only by: . MyChart Message (if you have MyChart) OR . A paper copy in the mail If you have any lab test that is abnormal or we need to change your treatment, we will call you to review the results.   Testing/Procedures: None ordered   Follow-Up: At [facility], you and your health needs are our priority.  As part of our continuing mission to provide you with exceptional heart care, we have created designated Provider Care Teams.  These Care Teams include your primary Cardiologist (physician) and Advanced Practice Providers (APPs -  Physician Assistants and Nurse Practitioners) who all work together to provide you with the care you need, when you need it.  We recommend signing up for the patient portal called "MyChart".  Sign up information is provided on this After Visit Summary.  MyChart is used to connect with patients for Virtual Visits (Telemedicine).  Patients are able to view lab/test results, encounter notes, upcoming appointments, etc.  Non-urgent messages can be sent to your provider as well.   To learn more about what you can do with MyChart, go to [email or web address].    Your next appointment:   12 month(s)  The format for your next appointment:   In Person  Provider:   Thomas Kelly, MD     

## 2021-05-16 ENCOUNTER — Encounter: Payer: Self-pay | Admitting: Cardiovascular Disease

## 2021-05-16 DIAGNOSIS — M9903 Segmental and somatic dysfunction of lumbar region: Secondary | ICD-10-CM | POA: Diagnosis not present

## 2021-05-16 DIAGNOSIS — M62830 Muscle spasm of back: Secondary | ICD-10-CM | POA: Diagnosis not present

## 2021-05-16 DIAGNOSIS — M9904 Segmental and somatic dysfunction of sacral region: Secondary | ICD-10-CM | POA: Diagnosis not present

## 2021-05-16 DIAGNOSIS — M5442 Lumbago with sciatica, left side: Secondary | ICD-10-CM | POA: Diagnosis not present

## 2021-05-16 DIAGNOSIS — M5137 Other intervertebral disc degeneration, lumbosacral region: Secondary | ICD-10-CM | POA: Diagnosis not present

## 2021-05-16 DIAGNOSIS — M5136 Other intervertebral disc degeneration, lumbar region: Secondary | ICD-10-CM | POA: Diagnosis not present

## 2021-05-20 DIAGNOSIS — H25813 Combined forms of age-related cataract, bilateral: Secondary | ICD-10-CM | POA: Diagnosis not present

## 2021-05-22 DIAGNOSIS — M5136 Other intervertebral disc degeneration, lumbar region: Secondary | ICD-10-CM | POA: Diagnosis not present

## 2021-05-22 DIAGNOSIS — M62830 Muscle spasm of back: Secondary | ICD-10-CM | POA: Diagnosis not present

## 2021-05-22 DIAGNOSIS — M9904 Segmental and somatic dysfunction of sacral region: Secondary | ICD-10-CM | POA: Diagnosis not present

## 2021-05-22 DIAGNOSIS — M9903 Segmental and somatic dysfunction of lumbar region: Secondary | ICD-10-CM | POA: Diagnosis not present

## 2021-05-22 DIAGNOSIS — M5137 Other intervertebral disc degeneration, lumbosacral region: Secondary | ICD-10-CM | POA: Diagnosis not present

## 2021-05-22 DIAGNOSIS — M5442 Lumbago with sciatica, left side: Secondary | ICD-10-CM | POA: Diagnosis not present

## 2021-05-24 DIAGNOSIS — M9903 Segmental and somatic dysfunction of lumbar region: Secondary | ICD-10-CM | POA: Diagnosis not present

## 2021-05-24 DIAGNOSIS — M5136 Other intervertebral disc degeneration, lumbar region: Secondary | ICD-10-CM | POA: Diagnosis not present

## 2021-05-24 DIAGNOSIS — M5442 Lumbago with sciatica, left side: Secondary | ICD-10-CM | POA: Diagnosis not present

## 2021-05-24 DIAGNOSIS — M5137 Other intervertebral disc degeneration, lumbosacral region: Secondary | ICD-10-CM | POA: Diagnosis not present

## 2021-05-24 DIAGNOSIS — M62830 Muscle spasm of back: Secondary | ICD-10-CM | POA: Diagnosis not present

## 2021-05-24 DIAGNOSIS — M9904 Segmental and somatic dysfunction of sacral region: Secondary | ICD-10-CM | POA: Diagnosis not present

## 2021-05-28 ENCOUNTER — Ambulatory Visit (HOSPITAL_COMMUNITY)
Admission: RE | Admit: 2021-05-28 | Discharge: 2021-05-28 | Disposition: A | Discharge status: Home / Self Care | Payer: Medicare Other | Source: Ambulatory Visit | Attending: Physician Assistant | Admitting: Physician Assistant

## 2021-05-28 ENCOUNTER — Other Ambulatory Visit: Payer: Self-pay

## 2021-05-28 ENCOUNTER — Encounter (HOSPITAL_COMMUNITY): Payer: Self-pay | Admitting: Physician Assistant

## 2021-05-28 VITALS — BP 122/88 | HR 129 | Ht 61.5 in | Wt 237.6 lb

## 2021-05-28 DIAGNOSIS — Z79899 Other long term (current) drug therapy: Secondary | ICD-10-CM | POA: Diagnosis not present

## 2021-05-28 DIAGNOSIS — E785 Hyperlipidemia, unspecified: Secondary | ICD-10-CM | POA: Insufficient documentation

## 2021-05-28 DIAGNOSIS — I480 Paroxysmal atrial fibrillation: Secondary | ICD-10-CM | POA: Diagnosis not present

## 2021-05-28 DIAGNOSIS — I2510 Atherosclerotic heart disease of native coronary artery without angina pectoris: Secondary | ICD-10-CM | POA: Diagnosis not present

## 2021-05-28 DIAGNOSIS — Z888 Allergy status to other drugs, medicaments and biological substances status: Secondary | ICD-10-CM | POA: Insufficient documentation

## 2021-05-28 DIAGNOSIS — Z7901 Long term (current) use of anticoagulants: Secondary | ICD-10-CM | POA: Insufficient documentation

## 2021-05-28 DIAGNOSIS — D6869 Other thrombophilia: Secondary | ICD-10-CM

## 2021-05-28 DIAGNOSIS — G4733 Obstructive sleep apnea (adult) (pediatric): Secondary | ICD-10-CM | POA: Insufficient documentation

## 2021-05-28 DIAGNOSIS — Z6841 Body Mass Index (BMI) 40.0 and over, adult: Secondary | ICD-10-CM | POA: Insufficient documentation

## 2021-05-28 DIAGNOSIS — Z79890 Hormone replacement therapy (postmenopausal): Secondary | ICD-10-CM | POA: Insufficient documentation

## 2021-05-28 DIAGNOSIS — E669 Obesity, unspecified: Secondary | ICD-10-CM | POA: Diagnosis not present

## 2021-05-28 DIAGNOSIS — E039 Hypothyroidism, unspecified: Secondary | ICD-10-CM | POA: Insufficient documentation

## 2021-05-28 LAB — BASIC METABOLIC PANEL
Anion gap: 10 (ref 5–15)
BUN: 14 mg/dL (ref 8–23)
CO2: 24 mmol/L (ref 22–32)
Calcium: 9.6 mg/dL (ref 8.9–10.3)
Chloride: 104 mmol/L (ref 98–111)
Creatinine, Ser: 0.75 mg/dL (ref 0.44–1.00)
GFR, Estimated: >60 mL/min (ref >60)
Glucose, Bld: 96 mg/dL (ref 70–99)
Potassium: 4 mmol/L (ref 3.5–5.1)
Sodium: 138 mmol/L (ref 135–145)

## 2021-05-28 LAB — MAGNESIUM: Magnesium: 2.2 mg/dL (ref 1.7–2.4)

## 2021-05-28 MED ORDER — DILTIAZEM HCL 30 MG PO TABS: ORAL_TABLET | ORAL | 1 refills | Status: DC

## 2021-05-28 NOTE — Patient Instructions (Signed)
Cardioversion scheduled for Tuesday, August 16th  - Arrive at the [facility] and go to admitting at [facility] not eat or drink anything after midnight the night prior to your procedure.  - Take all your morning medication (except diabetic medications) with a sip of water prior to arrival.  - You will not be able to drive home after your procedure.  - Do NOT miss any doses of your blood thinner - if you should miss a dose please notify our office immediately.  - If you feel as if you go back into normal rhythm prior to scheduled cardioversion, please notify our office immediately. If your procedure is canceled in the cardioversion suite you will be charged a cancellation fee. Patients will be asked to: to mask in public and hand hygiene (no longer quarantine) in the 3 days prior to surgery, to report if any COVID-19-like illness or household contacts to COVID-19 to determine need for testing      Until back in normal rhythm increase bisoprolol to 5mg  twice a day  Cardizem 30mg  -- take 1 tablet every 4 hours AS NEEDED for heart rate >100 as long as blood pressure >100.

## 2021-05-28 NOTE — Progress Notes (Signed)
Primary Care Physician: Noni Saupe, MD Primary Cardiologist: Dr Allyson Sabal Primary Electrophysiologist: none Referring Physician: [facility]   Shannon Griffin is a 75 y.o. female with a history of HLD, hypothyroid, OSA, and atrial fibrillation who presents for follow up in the [facility]. The patient was initially diagnosed with atrial fibrillation in 2012 and had a DCCV in 2015. Patient is on Eliquis for a CHADS2VASC score of 3. Patient was in her usual state of health until the evening of 09/07/20 when she noted elevated heart rates and increased fatigue. She took an extra dose of bisoprolol on 11/20 and 11/21 and converted back to SR. She did have a high salt dinner prior to the onset of her symptoms which she believes triggered her afib.   On follow up today, patient reports that yesterday (8/8) she started "feeling bad." She could not really articulate specific complaints but knew that something felt off. She checked her pulse which was irregular and rapid. She took two extra doses of BB which slowed her heart rate but her irregular rhythm persisted.   Today, she denies symptoms of chest pain, shortness of breath, orthopnea, PND, lower extremity edema, dizziness, presyncope, syncope, bleeding, or neurologic sequela. The patient is tolerating medications without difficulties and is otherwise without complaint today.    Atrial Fibrillation Risk Factors:  she does have symptoms or diagnosis of sleep apnea. she is compliant with CPAP therapy. she does not have a history of rheumatic fever. she does not have a history of alcohol use. The patient does have a history of early familial atrial fibrillation or other arrhythmias. Brother had afib.  she has a BMI of Body mass index is 44.17 kg/m.Marland Kitchen Filed Weights   05/28/21 1347  Weight: 107.8 kg     Family History  Problem Relation Age of Onset   Atrial fibrillation Mother    Atrial fibrillation Brother       Atrial Fibrillation Management history:  Previous antiarrhythmic drugs: none Previous cardioversions: 2015 Previous ablations: none CHADS2VASC score: 3 Anticoagulation history: Eliquis   Past Medical History:  Diagnosis Date   Atrial fibrillation (HCC) 06/27/2011   Echo - EF 55-60%, LV wall thickness mildly increased; mod/severe LA dilation; estimated pulm arterial systolic pressure elevated at 50-27XAJO   Heart murmur    Hyperlipidemia    Hypothyroid    OSA on CPAP 10/25/2012   ahi 1.5   Past Surgical History:  Procedure Laterality Date   CHOLECYSTECTOMY     TONSILLECTOMY      Current Outpatient Medications  Medication Sig Dispense Refill   bisoprolol (ZEBETA) 5 MG tablet TAKE 1/2 TABLET EVERY      MORNING AND 1 TABLET EVERY EVENING *UNICHEM MFR* 135 tablet 2   ELDERBERRY PO Take 1 tablet by mouth daily.     ELIQUIS 5 MG TABS tablet TAKE 1 TABLET TWICE A DAY 180 tablet 1   KRILL OIL PO Take by mouth. Takes occasionally     levothyroxine (SYNTHROID, LEVOTHROID) 112 MCG tablet Take 112 mcg by mouth daily before breakfast. Take 1 tablet 6 days a week and half tablet on 7th day     Multiple Vitamins-Minerals (MULTIVITAMIN WITH MINERALS) tablet Take 1 tablet by mouth daily.     VALERIAN PO Take by mouth.     vitamin C (ASCORBIC ACID) 500 MG tablet Take 500 mg by mouth daily.     vitamin E 400 UNIT capsule Take 400 Units by mouth daily.  No current facility-administered medications for this encounter.    Allergies  Allergen Reactions   Freeze It [Camphor-Menthol]     Histofreeze   Lidocaine Swelling    Social History   Socioeconomic History   Marital status: Widowed    Spouse name: Not on file   Number of children: Not on file   Years of education: Not on file   Highest education level: Not on file  Occupational History   Not on file  Tobacco Use   Smoking status: Never   Smokeless tobacco: Never  Substance and Sexual Activity   Alcohol use: No   Drug  use: No   Sexual activity: Never  Other Topics Concern   Not on file  Social History Narrative   Not on file   Social Determinants of Health   Financial Resource Strain: Not on file  Food Insecurity: Not on file  Transportation Needs: Not on file  Physical Activity: Not on file  Stress: Not on file  Social Connections: Not on file  Intimate Partner Violence: Not on file     ROS- All systems are reviewed and negative except as per the HPI above.  Physical Exam: Vitals:   05/28/21 1347  BP: 122/88  Pulse: (!) 129  Weight: 107.8 kg  Height: 5' 1.5" (1.562 m)    GEN- The patient is a well appearing obese elderly female, alert and oriented x 3 today.   HEENT-head normocephalic, atraumatic, sclera clear, conjunctiva pink, hearing intact, trachea midline. Lungs- Clear to ausculation bilaterally, normal work of breathing Heart- irregular rate and rhythm, no murmurs, rubs or gallops  GI- soft, NT, ND, + BS Extremities- no clubbing, cyanosis, or edema MS- no significant deformity or atrophy Skin- no rash or lesion Psych- euthymic mood, full affect Neuro- strength and sensation are intact   Wt Readings from Last 3 Encounters:  05/28/21 107.8 kg  05/14/21 108.1 kg  04/18/21 104.3 kg    EKG today demonstrates  Afib RVR Vent. rate 129 BPM PR interval * ms QRS duration 74 ms QT/QTcB 292/427 ms  Echo 03/29/20 demonstrated  1. Left ventricular ejection fraction, by estimation, is 60 to 65%. The  left ventricle has normal function. The left ventricle has no regional  wall motion abnormalities. Left ventricular diastolic parameters were  normal. The average left ventricular global longitudinal strain is -23.1 %.   2. Right ventricular systolic function is normal. The right ventricular  size is normal. There is moderately elevated pulmonary artery systolic pressure.   3. Left atrial size was mildly dilated.   4. The mitral valve is normal in structure. Trivial mitral valve   regurgitation. No evidence of mitral stenosis.   5. The aortic valve is normal in structure. Aortic valve regurgitation is not visualized. No aortic stenosis is present.   6. Aortic dilatation noted. There is mild dilatation of the ascending  aorta measuring 38 mm.  Epic records are reviewed at length today  CHA2DS2-VASc Score = 3  The patient's score is based upon: CHF History: No HTN History: No Diabetes History: No Stroke History: No Vascular Disease History: No Age Score: 2 Gender Score: 1      ASSESSMENT AND PLAN: 1. Paroxysmal Atrial Fibrillation (ICD10:  I48.0) The patient's CHA2DS2-VASc score is 3, indicating a 3.2% annual risk of stroke. Patient is in rapid afib today. We discussed therapeutic options today. Increase bisoprolol to 5 mg BID Start diltiazem 30 mg PRN q 4 hours for heart racing. Will arrange for  DCCV. Check bmet/cbc Continue Eliquis 5 mg BID, patient denies any missed doses.   2. Secondary Hypercoagulable State (ICD10:  D68.69) The patient is at significant risk for stroke/thromboembolism based upon her CHA2DS2-VASc Score of 3.  Continue Apixaban (Eliquis).   3. Obesity Body mass index is 44.17 kg/m. Lifestyle modification was discussed and encouraged including regular physical activity and weight reduction.  4. Obstructive sleep apnea Patient reports compliance with CPAP therapy. Followed by Dr Tresa Endo  5. CAD Coronary calcium score 141, 68th percentile on CT 10/26/19 No anginal symptoms.   Follow up in the AF clinic post DCCV.    Jorja Loa PA-C Afib Clinic [facility] [address] [place], [place] [address] [phone] 05/28/2021 1:55 PM

## 2021-06-03 ENCOUNTER — Ambulatory Visit (HOSPITAL_COMMUNITY)
Admission: RE | Admit: 2021-06-03 | Discharge: 2021-06-03 | Disposition: A | Discharge status: Home / Self Care | Payer: Medicare Other | Source: Ambulatory Visit | Attending: Nurse Practitioner | Admitting: Nurse Practitioner

## 2021-06-03 ENCOUNTER — Other Ambulatory Visit: Payer: Self-pay

## 2021-06-03 DIAGNOSIS — D6869 Other thrombophilia: Secondary | ICD-10-CM | POA: Diagnosis not present

## 2021-06-03 DIAGNOSIS — I480 Paroxysmal atrial fibrillation: Secondary | ICD-10-CM | POA: Diagnosis not present

## 2021-06-03 NOTE — Progress Notes (Signed)
Pt in for EKG after feeling she was back in rhythm after having a CV scheduled last week. EKG today shows that she is still out of rhythm with  afib at 145 bpm. She will proceed with CV tomorrow.

## 2021-06-04 ENCOUNTER — Ambulatory Visit (HOSPITAL_COMMUNITY): Payer: Medicare Other | Admitting: Anesthesiology

## 2021-06-04 ENCOUNTER — Encounter (HOSPITAL_COMMUNITY): Payer: Self-pay | Admitting: Cardiovascular Disease

## 2021-06-04 ENCOUNTER — Ambulatory Visit (HOSPITAL_COMMUNITY)
Admission: RE | Admit: 2021-06-04 | Discharge: 2021-06-04 | Disposition: A | Discharge status: Home / Self Care | Payer: Medicare Other | Attending: Cardiovascular Disease | Admitting: Cardiovascular Disease

## 2021-06-04 ENCOUNTER — Encounter (HOSPITAL_COMMUNITY)
Admission: RE | Disposition: A | Discharge status: Home / Self Care | Payer: Self-pay | Source: Home / Self Care | Attending: Cardiovascular Disease

## 2021-06-04 DIAGNOSIS — I4891 Unspecified atrial fibrillation: Secondary | ICD-10-CM | POA: Diagnosis not present

## 2021-06-04 DIAGNOSIS — Z538 Procedure and treatment not carried out for other reasons: Secondary | ICD-10-CM | POA: Diagnosis not present

## 2021-06-04 HISTORY — DX: Other specified postprocedural states: Z98.890

## 2021-06-04 HISTORY — DX: Nausea with vomiting, unspecified: R11.2

## 2021-06-04 SURGERY — CANCELLED PROCEDURE
Anesthesia: General

## 2021-06-04 NOTE — Anesthesia Preprocedure Evaluation (Deleted)
Anesthesia Evaluation  Patient identified by MRN, date of birth, ID band Patient awake    Reviewed: Allergy & Precautions, NPO status , Patient's Chart, lab work & pertinent test results  Airway Mallampati: II  TM Distance: <3 FB Neck ROM: Full    Dental no notable dental hx.    Pulmonary sleep apnea and Continuous Positive Airway Pressure Ventilation ,    Pulmonary exam normal breath sounds clear to auscultation       Cardiovascular + dysrhythmias Atrial Fibrillation  Rhythm:Irregular Rate:Normal     Neuro/Psych negative neurological ROS  negative psych ROS   GI/Hepatic negative GI ROS, Neg liver ROS,   Endo/Other  Hypothyroidism Morbid obesity  Renal/GU negative Renal ROS  negative genitourinary   Musculoskeletal negative musculoskeletal ROS (+)   Abdominal   Peds negative pediatric ROS (+)  Hematology negative hematology ROS (+)   Anesthesia Other Findings   Reproductive/Obstetrics negative OB ROS                             Anesthesia Physical Anesthesia Plan  ASA: 3  Anesthesia Plan: General   Post-op Pain Management:    Induction: Intravenous  PONV Risk Score and Plan: 3 and Treatment may vary due to age or medical condition  Airway Management Planned: Mask  Additional Equipment:   Intra-op Plan:   Post-operative Plan: Extubation in OR  Informed Consent: I have reviewed the patients History and Physical, chart, labs and discussed the procedure including the risks, benefits and alternatives for the proposed anesthesia with the patient or authorized representative who has indicated his/her understanding and acceptance.     Dental advisory given  Plan Discussed with: CRNA and Surgeon  Anesthesia Plan Comments:         Anesthesia Quick Evaluation

## 2021-06-04 NOTE — Progress Notes (Signed)
Patient arrived for cardioversion in [facility].  Confirmed with EKG.  Pt discharged home with daughter.  Roselie Awkward, RN

## 2021-06-11 ENCOUNTER — Other Ambulatory Visit: Payer: Self-pay

## 2021-06-11 ENCOUNTER — Ambulatory Visit (HOSPITAL_COMMUNITY)
Admission: RE | Admit: 2021-06-11 | Discharge: 2021-06-11 | Disposition: A | Discharge status: Home / Self Care | Payer: Medicare Other | Source: Ambulatory Visit | Attending: Physician Assistant | Admitting: Physician Assistant

## 2021-06-11 VITALS — BP 138/90 | HR 56 | Ht 61.5 in | Wt 240.8 lb

## 2021-06-11 DIAGNOSIS — E039 Hypothyroidism, unspecified: Secondary | ICD-10-CM | POA: Diagnosis not present

## 2021-06-11 DIAGNOSIS — I2510 Atherosclerotic heart disease of native coronary artery without angina pectoris: Secondary | ICD-10-CM | POA: Diagnosis not present

## 2021-06-11 DIAGNOSIS — G4733 Obstructive sleep apnea (adult) (pediatric): Secondary | ICD-10-CM | POA: Insufficient documentation

## 2021-06-11 DIAGNOSIS — D6869 Other thrombophilia: Secondary | ICD-10-CM | POA: Diagnosis not present

## 2021-06-11 DIAGNOSIS — Z79899 Other long term (current) drug therapy: Secondary | ICD-10-CM | POA: Insufficient documentation

## 2021-06-11 DIAGNOSIS — I480 Paroxysmal atrial fibrillation: Secondary | ICD-10-CM | POA: Insufficient documentation

## 2021-06-11 DIAGNOSIS — Z79890 Hormone replacement therapy (postmenopausal): Secondary | ICD-10-CM | POA: Insufficient documentation

## 2021-06-11 DIAGNOSIS — E785 Hyperlipidemia, unspecified: Secondary | ICD-10-CM | POA: Diagnosis not present

## 2021-06-11 DIAGNOSIS — E669 Obesity, unspecified: Secondary | ICD-10-CM | POA: Insufficient documentation

## 2021-06-11 DIAGNOSIS — Z7901 Long term (current) use of anticoagulants: Secondary | ICD-10-CM | POA: Diagnosis not present

## 2021-06-11 DIAGNOSIS — Z6841 Body Mass Index (BMI) 40.0 and over, adult: Secondary | ICD-10-CM | POA: Insufficient documentation

## 2021-06-11 MED ORDER — BISOPROLOL FUMARATE 5 MG PO TABS: ORAL_TABLET | ORAL | Status: DC

## 2021-06-11 NOTE — Progress Notes (Signed)
Primary Care Physician: Noni Saupe, MD Primary Cardiologist: Dr Allyson Sabal Primary Electrophysiologist: none Referring Physician: [facility]   Shannon Griffin is a 75 y.o. female with a history of HLD, hypothyroid, OSA, and atrial fibrillation who presents for follow up in the [facility]. The patient was initially diagnosed with atrial fibrillation in 2012 and had a DCCV in 2015. Patient is on Eliquis for a CHADS2VASC score of 3. Patient was in her usual state of health until the evening of 09/07/20 when she noted elevated heart rates and increased fatigue. She took an extra dose of bisoprolol on 11/20 and 11/21 and converted back to SR. She did have a high salt dinner prior to the onset of her symptoms which she believes triggered her afib.   On follow up today, patient arrived for DCCV in SR and procedure was cancelled. She feels that she converted to SR as she was walking into the hospital. She has not had any further episodes in the interim. She denies any bleeding issues on anticoagulation.   Today, she denies symptoms of palpitations, chest pain, shortness of breath, orthopnea, PND, lower extremity edema, dizziness, presyncope, syncope, bleeding, or neurologic sequela. The patient is tolerating medications without difficulties and is otherwise without complaint today.    Atrial Fibrillation Risk Factors:  she does have symptoms or diagnosis of sleep apnea. she is compliant with CPAP therapy. she does not have a history of rheumatic fever. she does not have a history of alcohol use. The patient does have a history of early familial atrial fibrillation or other arrhythmias. Brother had afib.  she has a BMI of Body mass index is 44.76 kg/m.Marland Kitchen Filed Weights   06/11/21 1143  Weight: 109.2 kg     Family History  Problem Relation Age of Onset   Atrial fibrillation Mother    Atrial fibrillation Brother      Atrial Fibrillation Management  history:  Previous antiarrhythmic drugs: none Previous cardioversions: 2015 Previous ablations: none CHADS2VASC score: 3 Anticoagulation history: Eliquis   Past Medical History:  Diagnosis Date   Atrial fibrillation (HCC) 06/27/2011   Echo - EF 55-60%, LV wall thickness mildly increased; mod/severe LA dilation; estimated pulm arterial systolic pressure elevated at 02-63ZCHY   Heart murmur    Hyperlipidemia    Hypothyroid    OSA on CPAP 10/25/2012   ahi 1.5   PONV (postoperative nausea and vomiting)    Past Surgical History:  Procedure Laterality Date   CHOLECYSTECTOMY     TONSILLECTOMY      Current Outpatient Medications  Medication Sig Dispense Refill   diltiazem (CARDIZEM) 30 MG tablet Take 1 tablet every 4 hours AS NEEDED for heart rate >100 30 tablet 1   ELDERBERRY PO Take 1 tablet by mouth daily.     ELIQUIS 5 MG TABS tablet TAKE 1 TABLET TWICE A DAY 180 tablet 1   levothyroxine (SYNTHROID, LEVOTHROID) 112 MCG tablet Take 112 mcg by mouth daily before breakfast. Take 112 mcg by mouth 6 days a week and 56 mcg on the 7th day     Multiple Vitamins-Minerals (MULTIVITAMIN WITH MINERALS) tablet Take 1 tablet by mouth daily.     vitamin C (ASCORBIC ACID) 500 MG tablet Take 500 mg by mouth daily.     vitamin E 400 UNIT capsule Take 400 Units by mouth daily.     bisoprolol (ZEBETA) 5 MG tablet Take 1/2 tablet by mouth in the am and 1 tablet by  mouth in the evening     VALERIAN PO Take 2 tablets by mouth 4 (four) times daily -  with meals and at bedtime. (Patient not taking: Reported on 06/11/2021)     No current facility-administered medications for this encounter.    Allergies  Allergen Reactions   Freeze It [Camphor-Menthol] Swelling    Histofreeze   Lidocaine Swelling    Social History   Socioeconomic History   Marital status: Widowed    Spouse name: Not on file   Number of children: Not on file   Years of education: Not on file   Highest education level: Not on  file  Occupational History   Not on file  Tobacco Use   Smoking status: Never   Smokeless tobacco: Never  Substance and Sexual Activity   Alcohol use: No   Drug use: No   Sexual activity: Never  Other Topics Concern   Not on file  Social History Narrative   Not on file   Social Determinants of Health   Financial Resource Strain: Not on file  Food Insecurity: Not on file  Transportation Needs: Not on file  Physical Activity: Not on file  Stress: Not on file  Social Connections: Not on file  Intimate Partner Violence: Not on file     ROS- All systems are reviewed and negative except as per the HPI above.  Physical Exam: Vitals:   06/11/21 1143  BP: 138/90  Pulse: (!) 56  Weight: 109.2 kg  Height: 5' 1.5" (1.562 m)   GEN- The patient is a well appearing obese elderly female, alert and oriented x 3 today.   HEENT-head normocephalic, atraumatic, sclera clear, conjunctiva pink, hearing intact, trachea midline. Lungs- Clear to ausculation bilaterally, normal work of breathing Heart- Regular rate and rhythm, no murmurs, rubs or gallops  GI- soft, NT, ND, + BS Extremities- no clubbing, cyanosis, or edema MS- no significant deformity or atrophy Skin- no rash or lesion Psych- euthymic mood, full affect Neuro- strength and sensation are intact   Wt Readings from Last 3 Encounters:  06/11/21 109.2 kg  05/28/21 107.8 kg  05/14/21 108.1 kg    EKG today demonstrates  SB Vent. rate 56 BPM PR interval 144 ms QRS duration 72 ms QT/QTcB 426/411 ms  Echo 03/29/20 demonstrated  1. Left ventricular ejection fraction, by estimation, is 60 to 65%. The  left ventricle has normal function. The left ventricle has no regional  wall motion abnormalities. Left ventricular diastolic parameters were normal. The average left ventricular global longitudinal strain is -23.1 %.   2. Right ventricular systolic function is normal. The right ventricular  size is normal. There is moderately  elevated pulmonary artery systolic pressure.   3. Left atrial size was mildly dilated.   4. The mitral valve is normal in structure. Trivial mitral valve  regurgitation. No evidence of mitral stenosis.   5. The aortic valve is normal in structure. Aortic valve regurgitation is not visualized. No aortic stenosis is present.   6. Aortic dilatation noted. There is mild dilatation of the ascending  aorta measuring 38 mm.  Epic records are reviewed at length today  CHA2DS2-VASc Score = 3  The patient's score is based upon: CHF History: No HTN History: No Diabetes History: No Stroke History: No Vascular Disease History: No Age Score: 2 Gender Score: 1      ASSESSMENT AND PLAN: 1. Paroxysmal Atrial Fibrillation (ICD10:  I48.0) The patient's CHA2DS2-VASc score is 3, indicating a 3.2% annual risk  of stroke. Patient spontaneously converted back to SR. We discussed options for afib prevention including AAD (Multaq, dofetilide) vs ablation. She would like to continue her present therapy for now. We discussed that she would likely need rhythm control at some point. Continue bisoprolol 2.5 mg AM and 5 mg PM Continue diltiazem 30 mg PRN q 4 hours for heart racing. Continue Eliquis 5 mg BID  2. Secondary Hypercoagulable State (ICD10:  D68.69) The patient is at significant risk for stroke/thromboembolism based upon her CHA2DS2-VASc Score of 3.  Continue Apixaban (Eliquis).   3. Obesity Body mass index is 44.76 kg/m. Lifestyle modification was discussed and encouraged including regular physical activity and weight reduction. [facility]  4. Obstructive sleep apnea Patient reports compliance with CPAP therapy. Followed by Dr Tresa Endo  5. CAD Coronary calcium score 141, 68th percentile on CT 10/26/19 No anginal symptoms.   Follow up in the AF clinic in 3 months.    Jorja Loa PA-C Afib Clinic [facility] [address] [place], [place] [address] [phone] 06/11/2021 12:22 PM

## 2021-06-17 DIAGNOSIS — M5442 Lumbago with sciatica, left side: Secondary | ICD-10-CM | POA: Diagnosis not present

## 2021-06-17 DIAGNOSIS — M5136 Other intervertebral disc degeneration, lumbar region: Secondary | ICD-10-CM | POA: Diagnosis not present

## 2021-06-17 DIAGNOSIS — M9904 Segmental and somatic dysfunction of sacral region: Secondary | ICD-10-CM | POA: Diagnosis not present

## 2021-06-17 DIAGNOSIS — M9903 Segmental and somatic dysfunction of lumbar region: Secondary | ICD-10-CM | POA: Diagnosis not present

## 2021-06-17 DIAGNOSIS — M62830 Muscle spasm of back: Secondary | ICD-10-CM | POA: Diagnosis not present

## 2021-06-17 DIAGNOSIS — M5137 Other intervertebral disc degeneration, lumbosacral region: Secondary | ICD-10-CM | POA: Diagnosis not present

## 2021-06-19 DIAGNOSIS — M62830 Muscle spasm of back: Secondary | ICD-10-CM | POA: Diagnosis not present

## 2021-06-19 DIAGNOSIS — M9904 Segmental and somatic dysfunction of sacral region: Secondary | ICD-10-CM | POA: Diagnosis not present

## 2021-06-19 DIAGNOSIS — M5137 Other intervertebral disc degeneration, lumbosacral region: Secondary | ICD-10-CM | POA: Diagnosis not present

## 2021-06-19 DIAGNOSIS — M5442 Lumbago with sciatica, left side: Secondary | ICD-10-CM | POA: Diagnosis not present

## 2021-06-19 DIAGNOSIS — M9903 Segmental and somatic dysfunction of lumbar region: Secondary | ICD-10-CM | POA: Diagnosis not present

## 2021-06-19 DIAGNOSIS — M5136 Other intervertebral disc degeneration, lumbar region: Secondary | ICD-10-CM | POA: Diagnosis not present

## 2021-06-21 DIAGNOSIS — M62830 Muscle spasm of back: Secondary | ICD-10-CM | POA: Diagnosis not present

## 2021-06-21 DIAGNOSIS — M9904 Segmental and somatic dysfunction of sacral region: Secondary | ICD-10-CM | POA: Diagnosis not present

## 2021-06-21 DIAGNOSIS — M5137 Other intervertebral disc degeneration, lumbosacral region: Secondary | ICD-10-CM | POA: Diagnosis not present

## 2021-06-21 DIAGNOSIS — M5136 Other intervertebral disc degeneration, lumbar region: Secondary | ICD-10-CM | POA: Diagnosis not present

## 2021-06-21 DIAGNOSIS — M9903 Segmental and somatic dysfunction of lumbar region: Secondary | ICD-10-CM | POA: Diagnosis not present

## 2021-06-21 DIAGNOSIS — M5442 Lumbago with sciatica, left side: Secondary | ICD-10-CM | POA: Diagnosis not present

## 2021-06-26 DIAGNOSIS — M9903 Segmental and somatic dysfunction of lumbar region: Secondary | ICD-10-CM | POA: Diagnosis not present

## 2021-06-26 DIAGNOSIS — M5442 Lumbago with sciatica, left side: Secondary | ICD-10-CM | POA: Diagnosis not present

## 2021-06-26 DIAGNOSIS — M5136 Other intervertebral disc degeneration, lumbar region: Secondary | ICD-10-CM | POA: Diagnosis not present

## 2021-06-26 DIAGNOSIS — M9904 Segmental and somatic dysfunction of sacral region: Secondary | ICD-10-CM | POA: Diagnosis not present

## 2021-06-26 DIAGNOSIS — M62830 Muscle spasm of back: Secondary | ICD-10-CM | POA: Diagnosis not present

## 2021-06-26 DIAGNOSIS — M5137 Other intervertebral disc degeneration, lumbosacral region: Secondary | ICD-10-CM | POA: Diagnosis not present

## 2021-07-03 DIAGNOSIS — M5137 Other intervertebral disc degeneration, lumbosacral region: Secondary | ICD-10-CM | POA: Diagnosis not present

## 2021-07-03 DIAGNOSIS — M9903 Segmental and somatic dysfunction of lumbar region: Secondary | ICD-10-CM | POA: Diagnosis not present

## 2021-07-03 DIAGNOSIS — M5136 Other intervertebral disc degeneration, lumbar region: Secondary | ICD-10-CM | POA: Diagnosis not present

## 2021-07-03 DIAGNOSIS — M62830 Muscle spasm of back: Secondary | ICD-10-CM | POA: Diagnosis not present

## 2021-07-03 DIAGNOSIS — M9904 Segmental and somatic dysfunction of sacral region: Secondary | ICD-10-CM | POA: Diagnosis not present

## 2021-07-03 DIAGNOSIS — M5442 Lumbago with sciatica, left side: Secondary | ICD-10-CM | POA: Diagnosis not present

## 2021-07-10 ENCOUNTER — Other Ambulatory Visit: Payer: Self-pay

## 2021-07-10 ENCOUNTER — Encounter: Payer: Self-pay | Admitting: Allergy & Immunology

## 2021-07-10 ENCOUNTER — Ambulatory Visit (INDEPENDENT_AMBULATORY_CARE_PROVIDER_SITE_OTHER): Payer: Medicare Other | Admitting: Allergy & Immunology

## 2021-07-10 VITALS — BP 132/86 | HR 64 | Temp 98.3°F | Resp 16 | Ht 61.0 in | Wt 238.4 lb

## 2021-07-10 DIAGNOSIS — M9903 Segmental and somatic dysfunction of lumbar region: Secondary | ICD-10-CM | POA: Diagnosis not present

## 2021-07-10 DIAGNOSIS — M62830 Muscle spasm of back: Secondary | ICD-10-CM | POA: Diagnosis not present

## 2021-07-10 DIAGNOSIS — M5137 Other intervertebral disc degeneration, lumbosacral region: Secondary | ICD-10-CM | POA: Diagnosis not present

## 2021-07-10 DIAGNOSIS — L239 Allergic contact dermatitis, unspecified cause: Secondary | ICD-10-CM | POA: Diagnosis not present

## 2021-07-10 DIAGNOSIS — T50905D Adverse effect of unspecified drugs, medicaments and biological substances, subsequent encounter: Secondary | ICD-10-CM

## 2021-07-10 DIAGNOSIS — G43809 Other migraine, not intractable, without status migrainosus: Secondary | ICD-10-CM

## 2021-07-10 DIAGNOSIS — M5136 Other intervertebral disc degeneration, lumbar region: Secondary | ICD-10-CM | POA: Diagnosis not present

## 2021-07-10 DIAGNOSIS — M5442 Lumbago with sciatica, left side: Secondary | ICD-10-CM | POA: Diagnosis not present

## 2021-07-10 DIAGNOSIS — M9904 Segmental and somatic dysfunction of sacral region: Secondary | ICD-10-CM | POA: Diagnosis not present

## 2021-07-10 NOTE — Progress Notes (Signed)
NEW PATIENT  Date of Service/Encounter:  07/10/21  Consult requested by: Noni Saupe, MD   Assessment:   Adverse effect of drug  Allergic contact dermatitis  Allergic rhinitis - with a history of migraines (treated with nasal saline rinses)   Ms. Stick presents for an evaluation of an adverse reaction to lidocaine.  Her history is more consistent with a contact dermatitis rather than IgE mediated anaphylaxis.  Therefore, prick testing and intradermal testing would not be predictive of any reaction that she is describing.  Instead, it seems that we need to do patch testing to look for delayed hypersensitivity.  I would like to do testing to local anesthetics including lidocaine.  Lidocaine is included in the caine mix on the True Test patches.  However, she is very adamant that she not have anything containing lidocaine put under her skin at all.  This leaves [place] in a very fraught place, as many of the medications that the ophthalmologist uses for cataract surgery contain lidocaine.  I did call the ophthalmology office on the evening of September 22 to ask whether it be possible to obtain samples of the medications used during the procedure.  I received the phone number for the team of the physician - Dr. Sheffield Slider - and left a voicemail explaining the situation and asking for samples.  Hopefully we can get those so that she can undergo patch testing next Monday.  We are on a rather tight timeline since she has her surgery the first full week of October.  Slab 9, we do have mepivacaine in our office as well as lidocaine for testing.  We can certainly put some mepivacaine on her skin for patch testing.  Unfortunately, we do not have access to any other local anesthetics, at least once we can get in a timely manner.  Plan/Recommendations:   Adverse drug reaction - I think we need to go ahead and do patch testing to these numbing drops. - I am going to call the [facility]  ophthalmology office to see if we can get samples of these drops. - We will schedule you for the patch testing in [place] on Monday at 8am.  2. Return in about 5 days (around 07/15/2021).   This note in its entirety was forwarded to the Provider who requested this consultation.  Subjective:   Brittni Hult is a 75 y.o. female presenting today for evaluation of  Chief Complaint  Patient presents with   Allergic Reaction    Site swelling, hot and painful from lidocaine use. - she had stitches in her hand and lidocaine cause her finger to swell so big they were like a baseball glove.    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  Chevonne Zagal was referred by Noni Saupe, MD.     Kealey is a 75 y.o. female presenting for an evaluation of an adverse drug reaction.  She is scheduled for cataract surgery on October 3rd. She was told that she needed to undergo testing to lidocaine. She is worried about reacting to "  numbing drops".  Her original reaction was many years ago. She cut her finger and had a of injections to tolerate the stitches. The following day, she had swelling of her finger extending past her wrist. She had to carry her arm around on a pillow and it took one week for it all to resolve.   She had another reaction involving Solarcaine gel on her arm for a sunburn. Within a few hours, she developed redness and pain over her hand. Symptoms lasted several days.   She had another episode where she wad some skin tags removed. She used "histafreeze" and she developed whelts  where it was applied. This resolved within one day.   Most of the reactions were rash and swelling. She had no systemic symptoms at all, including throat swelling, wheezing, or altered mental status. She never had any treatment for this including antihistamines.   Allergic Rhinitis Symptom History: She had migraines for a few days with exposures to allergens. Now she uses a saline spray. She underwent testing. She thinks that she was on allergy shots for a period of time.   Otherwise, there is no history of other atopic diseases, including asthma, food allergies, stinging insect allergies, eczema, urticaria, or contact dermatitis. There is no significant infectious history. Vaccinations are up to date.    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 If you have any questions, ask your nurse or doctor.          bisoprolol 5 MG tablet Commonly known as: ZEBETA Take 1/2 tablet by mouth in the am and 1 tablet by mouth in the evening   diltiazem 30 MG tablet Commonly known as: Cardizem Take 1 tablet every 4 hours AS NEEDED for heart rate >100   ELDERBERRY PO Take 1 tablet by mouth daily.   Eliquis 5 MG Tabs tablet Generic drug: apixaban TAKE 1 TABLET TWICE A DAY   levothyroxine 112 MCG tablet Commonly known as: SYNTHROID Take 112 mcg by mouth daily before breakfast. Take 112 mcg by mouth 6 days a week and 56 mcg on the 7th day   multivitamin with minerals tablet Take 1 tablet by mouth  daily.   VALERIAN PO Take 2 tablets by mouth 4 (four) times daily -  with meals and at bedtime.   vitamin C 500 MG tablet Commonly known as: ASCORBIC ACID Take 500 mg by mouth daily.   vitamin E 180 MG (400 UNITS) capsule Take 400 Units by mouth daily.        Birth History: non-contributory  Developmental History: non-contributory  Past Surgical History: Past Surgical History:  Procedure Laterality Date   CHOLECYSTECTOMY     TONSILLECTOMY       Family History: Family History  Problem Relation Age of Onset   Atrial fibrillation Mother    Atrial fibrillation Brother      Social History: Halina lives at home with her family.  They live in a house  that is 76 years old.  There is laminate throughout the home.  They have a heat pump for heating and central cooling.  There is a cat and 2 dogs in the home.  There are dust mite covers on the bed as well as the pillows.  There is no tobacco exposure.  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  She has one 48 year old grandson who is currently stationed in [place].  She is originally from [place].   Review of Systems  Constitutional: Negative.  Negative for chills, fever, malaise/fatigue and weight loss.  HENT: Negative.  Negative for congestion, ear discharge and ear pain.   Eyes:  Negative for pain, discharge and redness.  Respiratory:  Negative for cough, sputum production, shortness of breath and wheezing.   Cardiovascular: Negative.  Negative for chest pain and palpitations.  Gastrointestinal:  Negative for abdominal pain, diarrhea, heartburn, nausea and vomiting.  Skin:  Positive for itching and rash.  Neurological:  Negative for dizziness and headaches.  Endo/Heme/Allergies:  Negative for environmental allergies. Does not bruise/bleed easily.      Objective:   Blood pressure 132/86, pulse 64, temperature 98.3 F (36.8 C), resp. rate 16, height 5\' 1"  (1.549 m), weight 238 lb 6.4 oz  (108.1 kg), SpO2 98 %. Body mass index is 45.05 kg/m.   Physical Exam:   Physical Exam Constitutional:      Appearance: She is well-developed.     Comments: Anxious.   HENT:     Head: Normocephalic and atraumatic.     Right Ear: Tympanic membrane, ear canal and external ear normal. No drainage, swelling or tenderness. Tympanic membrane is not injected, scarred, erythematous, retracted or bulging.     Left Ear: Tympanic membrane, ear canal and external ear normal. No drainage, swelling or tenderness. Tympanic membrane is not injected, scarred, erythematous, retracted or bulging.     Nose: No nasal deformity, septal deviation, mucosal edema or rhinorrhea.     Right Sinus: No maxillary sinus tenderness or frontal sinus tenderness.     Left Sinus: No maxillary sinus tenderness or frontal sinus tenderness.     Mouth/Throat:     Mouth: Mucous membranes are not pale and not dry.     Pharynx: Uvula midline.  Eyes:     General:        Right eye: No discharge.        Left eye: No discharge.     Conjunctiva/sclera: Conjunctivae normal.     Right eye: Right conjunctiva is not injected. No chemosis.    Left eye: Left conjunctiva is not injected. No chemosis.    Pupils: Pupils are equal, round, and reactive to light.  Cardiovascular:     Rate and Rhythm: Normal rate and regular rhythm.     Heart sounds: Normal heart sounds.  Pulmonary:     Effort: Pulmonary effort is normal. No tachypnea, accessory muscle usage or respiratory distress.     Breath sounds: Normal breath sounds. No wheezing, rhonchi or rales.  Chest:     Chest wall: No tenderness.  Abdominal:     Tenderness: There is no abdominal tenderness. There is no guarding or rebound.  Lymphadenopathy:     Head:     Right side of head: No submandibular, tonsillar or occipital adenopathy.     Left side of head: No submandibular, tonsillar or occipital adenopathy.     Cervical: No cervical adenopathy.  Skin:    Coloration: Skin is  not pale.     Findings: No abrasion, erythema, petechiae or rash. Rash is not papular, urticarial or vesicular.  Neurological:     Mental Status: She is alert.     Diagnostic studies: none     Malachi Bonds, MD [facility]

## 2021-07-10 NOTE — Patient Instructions (Addendum)
Adverse drug reaction - I think we need to go ahead and do patch testing to these numbing drops. - I am going to call the [facility] ophthalmology office to see if we can get samples of these drops. - We will schedule you for the patch testing in [place] on Monday at 8am.  2. Return in about 5 days (around 07/15/2021).    Please inform us of any Emergency Department visits, hospitalizations, or changes in symptoms. Call us before going to the ED for breathing or allergy symptoms since we might be able to fit you in for a sick visit. Feel free to contact us anytime with any questions, problems, or concerns.  It was a pleasure to meet you today!  Websites that have reliable patient information: 1. American Academy of Asthma, Allergy, and Immunology: www.aaaai.org 2. Food Allergy Research and Education (FARE): foodallergy.org 3. Mothers of Asthmatics: http://www.asthmacommunitynetwork.org 4. American College of Allergy, Asthma, and Immunology: www.acaai.org   COVID-19 Vaccine Information can be found at: PodExchange.nl For questions related to vaccine distribution or appointments, please email [email or web address] or call [phone].   We realize that you might be concerned about having an allergic reaction to the COVID19 vaccines. To help with that concern, WE ARE OFFERING THE COVID19 VACCINES IN OUR OFFICE! Ask the front desk for dates!     "Like" [place] on Facebook and Instagram for our latest updates!      A healthy democracy works best when [employer] participate! Make sure you are registered to vote! If you have moved or changed any of your contact information, you will need to get this updated before voting!  In some cases, you MAY be able to register to vote online: [email or web address]

## 2021-07-11 ENCOUNTER — Encounter: Payer: Self-pay | Admitting: Allergy & Immunology

## 2021-07-12 DIAGNOSIS — Z01818 Encounter for other preprocedural examination: Secondary | ICD-10-CM | POA: Diagnosis not present

## 2021-07-12 DIAGNOSIS — H25811 Combined forms of age-related cataract, right eye: Secondary | ICD-10-CM | POA: Diagnosis not present

## 2021-07-15 ENCOUNTER — Encounter: Payer: Self-pay | Admitting: Allergy and Immunology

## 2021-07-15 ENCOUNTER — Other Ambulatory Visit: Payer: Self-pay

## 2021-07-15 ENCOUNTER — Ambulatory Visit (INDEPENDENT_AMBULATORY_CARE_PROVIDER_SITE_OTHER): Payer: Medicare Other | Admitting: Allergy and Immunology

## 2021-07-15 VITALS — BP 130/82 | HR 60 | Resp 16

## 2021-07-15 DIAGNOSIS — L239 Allergic contact dermatitis, unspecified cause: Secondary | ICD-10-CM

## 2021-07-15 DIAGNOSIS — T50905D Adverse effect of unspecified drugs, medicaments and biological substances, subsequent encounter: Secondary | ICD-10-CM

## 2021-07-16 NOTE — Progress Notes (Signed)
Shannon Griffin presents to this clinic to have patch testing performed.  Patch testing with proparacaine, tetracaine, mepivacaine was placed and she will return to this clinic in 48 hours for her initial read and then a subsequent 7-day read.

## 2021-07-17 ENCOUNTER — Other Ambulatory Visit: Payer: Self-pay

## 2021-07-17 ENCOUNTER — Ambulatory Visit: Payer: Medicare Other | Admitting: Allergy and Immunology

## 2021-07-17 DIAGNOSIS — L239 Allergic contact dermatitis, unspecified cause: Secondary | ICD-10-CM

## 2021-07-18 NOTE — Progress Notes (Signed)
Shannon Griffin returns for a 48-hour patch test read.  Evaluation of her patch test sites identified no reactivity.  She will return to this clinic for a 7-day read.

## 2021-07-19 ENCOUNTER — Encounter: Payer: Self-pay | Admitting: Allergy

## 2021-07-19 ENCOUNTER — Ambulatory Visit: Payer: Medicare Other | Admitting: Allergy

## 2021-07-19 ENCOUNTER — Other Ambulatory Visit: Payer: Self-pay

## 2021-07-19 DIAGNOSIS — T50905D Adverse effect of unspecified drugs, medicaments and biological substances, subsequent encounter: Secondary | ICD-10-CM

## 2021-07-19 NOTE — Addendum Note (Signed)
Addended by: Deborra Medina on: 07/19/2021 11:30 AM   Modules accepted: Orders

## 2021-07-19 NOTE — Progress Notes (Signed)
    Follow-up Note  RE: Shannon Griffin MRN: [id number] DOB: 1946-04-07 Date of Office Visit: 07/19/2021  Primary care provider: Noni Saupe, MD Referring provider: Noni Saupe, MD   Mekenna returns to the office today for the final patch test interpretation, given suspected history of contact dermatitis.    Diagnostics:  "Caine" patch testing 7 day reading: Negative to proparacaine hydrochloride 0.5%, tetracaine hydrochloride 0.5%, mepivacaine hydrochloride 3%  Plan:  Contact dermatitis patch testing to anesthetic medications above are negative.  Thus there is no evidence of local reactivity on skin contact to the above anesthetics. Lidocaine patch testing not performed per patient request.  Margo Aye, MD [facility]

## 2021-07-22 DIAGNOSIS — H25811 Combined forms of age-related cataract, right eye: Secondary | ICD-10-CM | POA: Diagnosis not present

## 2021-07-22 DIAGNOSIS — H2511 Age-related nuclear cataract, right eye: Secondary | ICD-10-CM | POA: Diagnosis not present

## 2021-07-22 DIAGNOSIS — H52201 Unspecified astigmatism, right eye: Secondary | ICD-10-CM | POA: Diagnosis not present

## 2021-07-29 ENCOUNTER — Encounter: Payer: Self-pay | Admitting: Allergy and Immunology

## 2021-08-14 DIAGNOSIS — H2512 Age-related nuclear cataract, left eye: Secondary | ICD-10-CM | POA: Diagnosis not present

## 2021-08-14 DIAGNOSIS — H25812 Combined forms of age-related cataract, left eye: Secondary | ICD-10-CM | POA: Diagnosis not present

## 2021-08-14 DIAGNOSIS — H52202 Unspecified astigmatism, left eye: Secondary | ICD-10-CM | POA: Diagnosis not present

## 2021-08-22 ENCOUNTER — Other Ambulatory Visit: Payer: Self-pay | Admitting: Cardiovascular Disease

## 2021-08-22 NOTE — Telephone Encounter (Signed)
Prescription refill request for Eliquis received. Indication:Afib Last office visit:7/22 Scr:0.7 Age: 75 Weight:108.1 kg  Prescription refilled

## 2021-09-17 ENCOUNTER — Ambulatory Visit (HOSPITAL_COMMUNITY): Payer: Medicare Other | Admitting: Physician Assistant

## 2021-09-29 IMAGING — CT CT HEART SCORING
2 series · 16 of 20 positions shown, 18 images · non-contrast
Comparison: None.
COMPARISON: None.

Addendum:
EXAM:
OVER-READ INTERPRETATION  CT CHEST

The following report is an over-read performed by radiologist Dr.
Belki Ammari [REDACTED] on 10/26/2019. This over-read
does not include interpretation of cardiac or coronary anatomy or
pathology. The coronary calcium score interpretation by the
cardiologist is attached.
CLINICAL DATA: Risk stratification
Coronary Calcium Score
TECHNIQUE: The patient was scanned on a Siemens Force scanner. Axial
non-contrast 3 mm slices were carried out through the heart. The
data set was analyzed on a dedicated work station and scored using
the Agatson method.

[Series 2: casc 3.0 i36f 2 bestdiast 73 % · axial · 0.39mm/px · z∈[-282,-177]mm · 8 of 45 slices shown, 10 images]
[im 5/45  vessel]
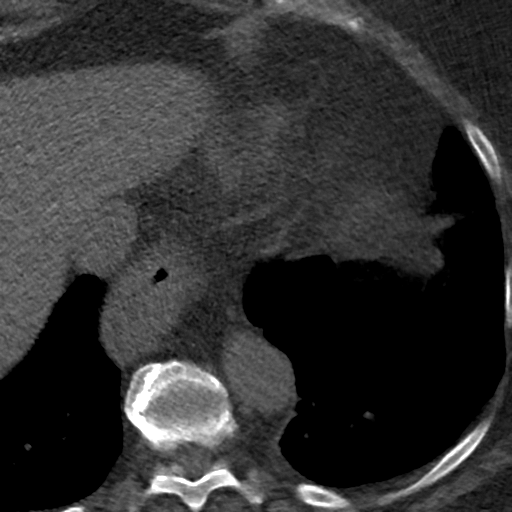
[im 5/45  lung]
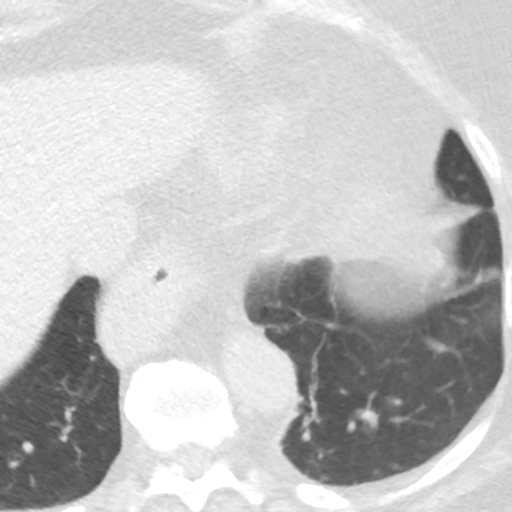
[im 10/45  vessel]
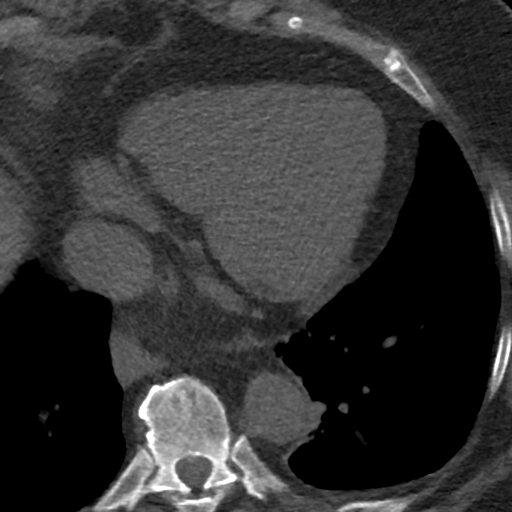
[im 15/45  vessel]
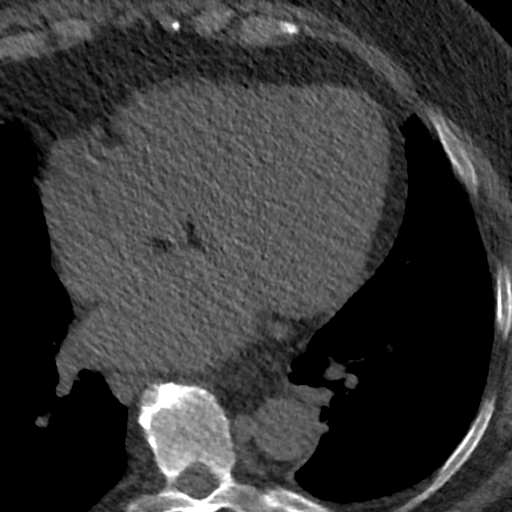
[im 20/45  vessel]
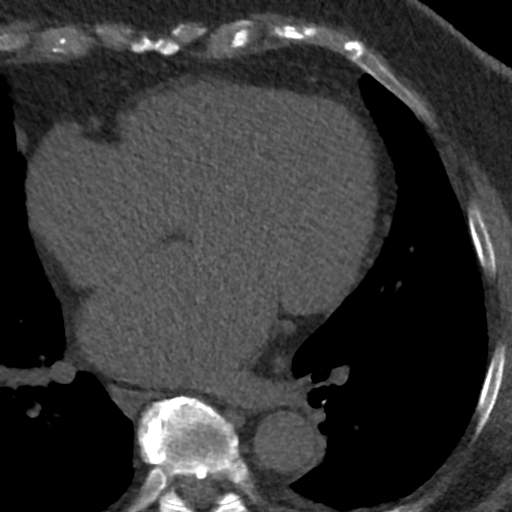
[im 25/45  vessel]
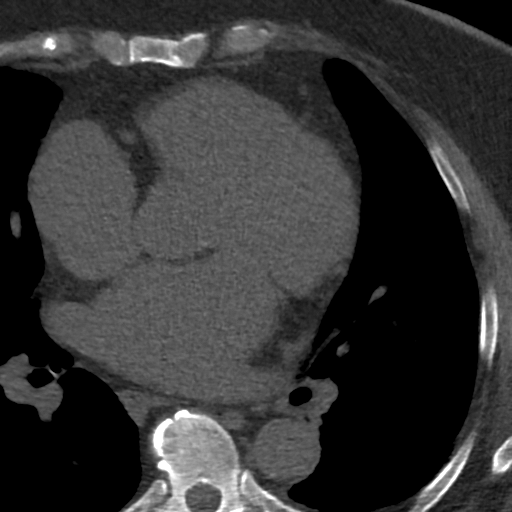
[im 25/45  lung]
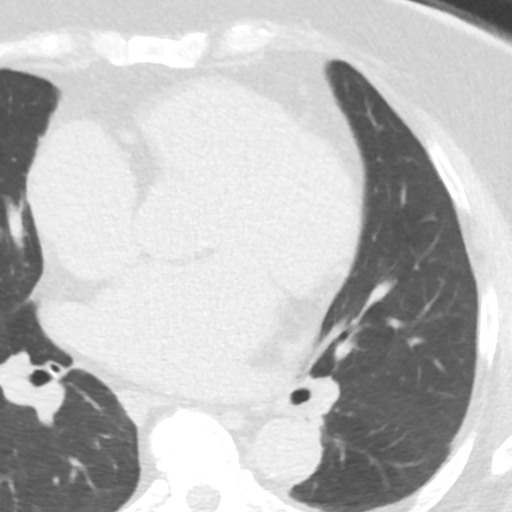
[im 30/45  vessel]
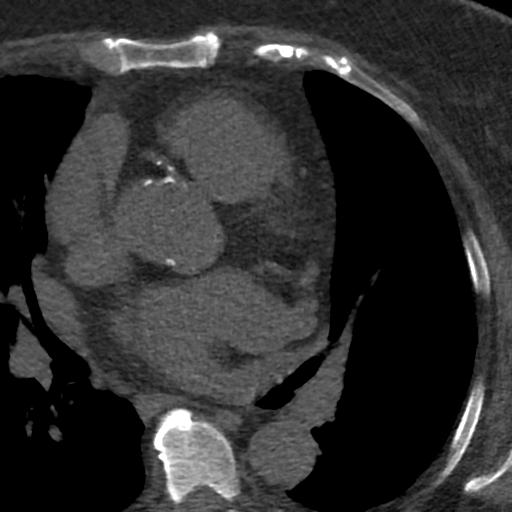
[im 35/45  vessel]
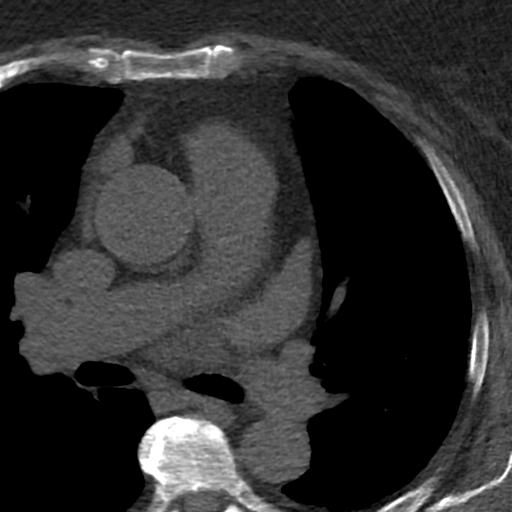
[im 40/45  vessel]
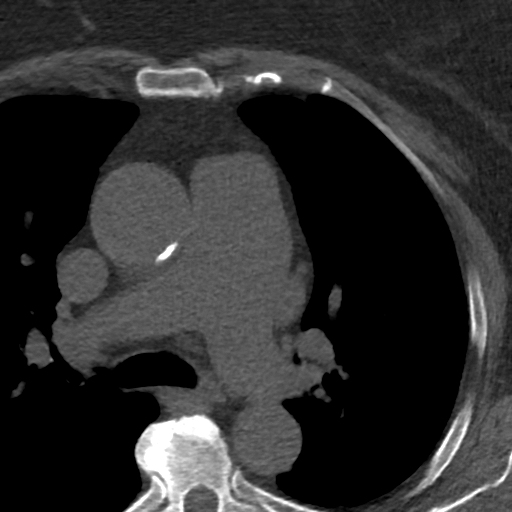

[Series 4: lung st 74 % · axial · 0.73mm/px · z∈[-282,-177]mm · 8 of 45 slices shown]
[im 5/45  lung]
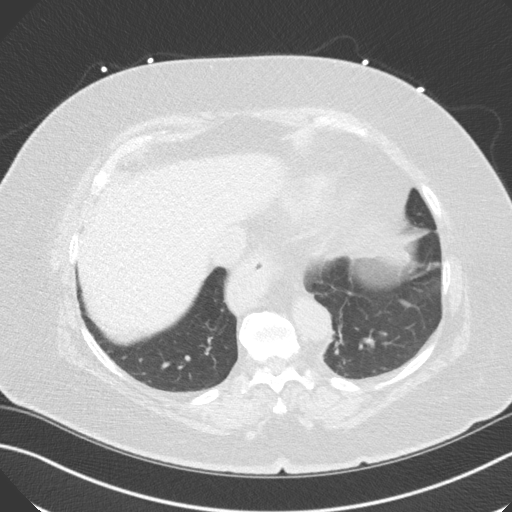
[im 10/45  lung]
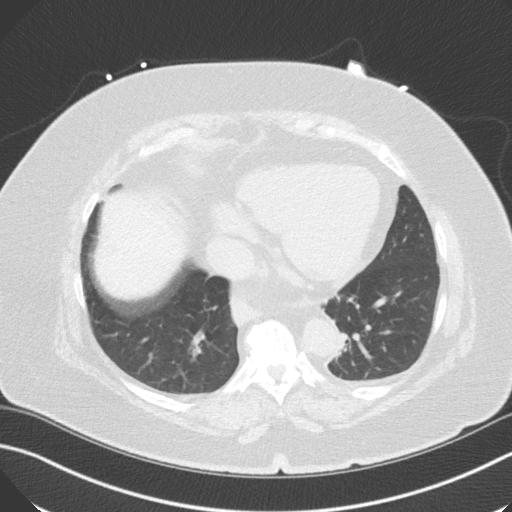
[im 15/45  lung]
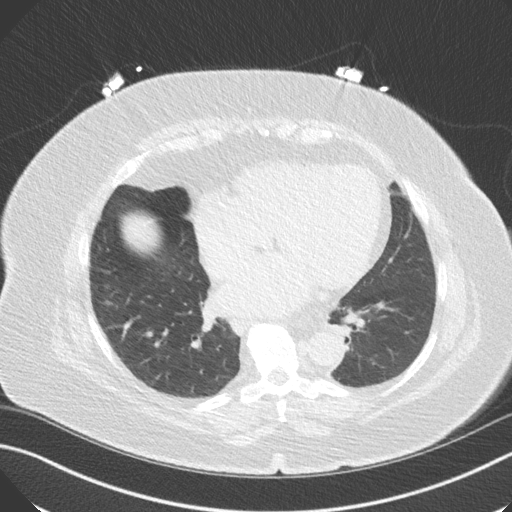
[im 20/45  lung]
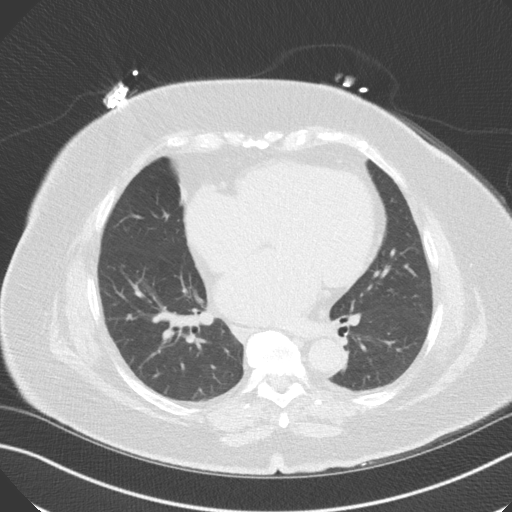
[im 25/45  lung]
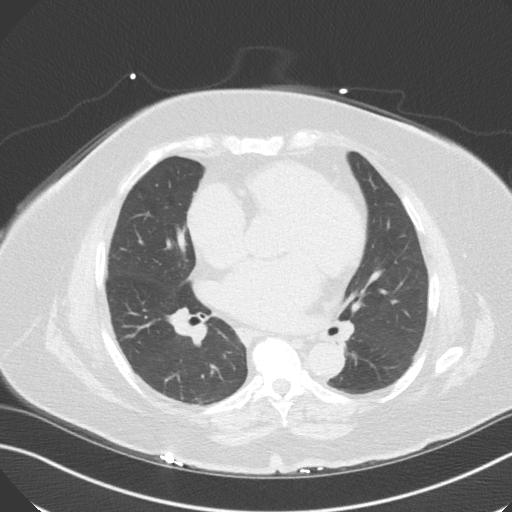
[im 30/45  lung]
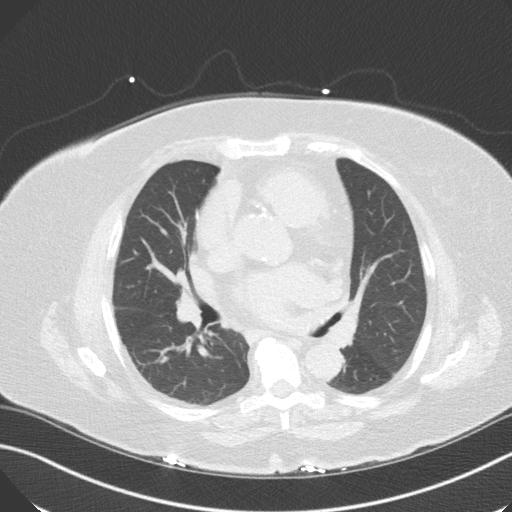
[im 35/45  lung]
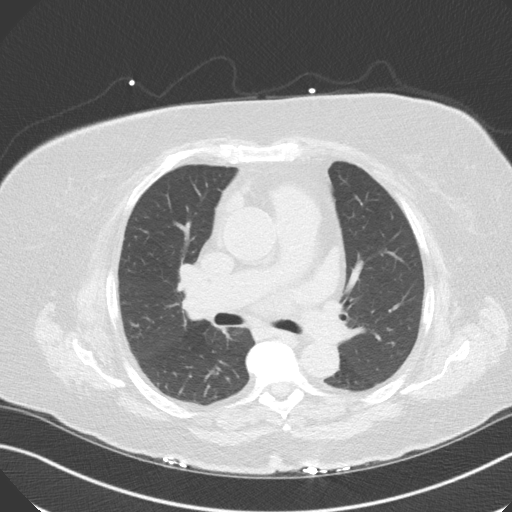
[im 40/45  lung]
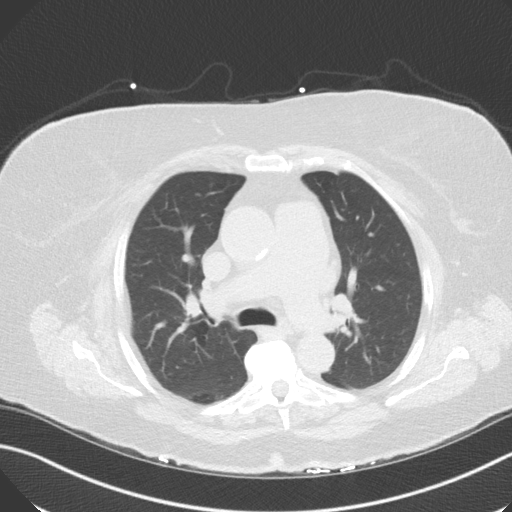

[16 of 20 positions shown; findings below may reference images not displayed]

FINDINGS: Vascular: Heart is normal size. Aorta is normal caliber.
Calcifications within the aortic root and visualized descending
thoracic aorta.

Mediastinum/Nodes: No adenopathy in the lower mediastinum or hila.
Small hiatal hernia.

Lungs/Pleura: Linear scarring in the left base. No confluent
opacities or effusions.

Upper Abdomen: Imaging into the upper abdomen shows no acute
findings.

Musculoskeletal: Chest wall soft tissues are unremarkable. No acute
bony abnormality.
IMPRESSION: No acute extra cardiac abnormality.

Small a are.

Aortic atherosclerosis.
FINDINGS: Non-cardiac: Large hiatal hernia. See separate report from

Ascending Aorta: Normal caliber.  Mild aortic root atherosclerosis.

Pericardium: Normal.

Coronary arteries: Normal origins.
IMPRESSION: 1. Coronary calcium score of 141. This was 68th percentile for age
and sex matched control.

2. Aortic root atherosclerosis.

*** End of Addendum ***
EXAM:
OVER-READ INTERPRETATION  CT CHEST

The following report is an over-read performed by radiologist Dr.
Belki Ammari [REDACTED] on 10/26/2019. This over-read
does not include interpretation of cardiac or coronary anatomy or
pathology. The coronary calcium score interpretation by the
cardiologist is attached.
FINDINGS: Vascular: Heart is normal size. Aorta is normal caliber.
Calcifications within the aortic root and visualized descending
thoracic aorta.

Mediastinum/Nodes: No adenopathy in the lower mediastinum or hila.
Small hiatal hernia.

Lungs/Pleura: Linear scarring in the left base. No confluent
opacities or effusions.

Upper Abdomen: Imaging into the upper abdomen shows no acute
findings.

Musculoskeletal: Chest wall soft tissues are unremarkable. No acute
bony abnormality.
IMPRESSION: No acute extra cardiac abnormality.

Small a are.

Aortic atherosclerosis.

## 2021-10-09 DIAGNOSIS — M5136 Other intervertebral disc degeneration, lumbar region: Secondary | ICD-10-CM | POA: Diagnosis not present

## 2021-10-09 DIAGNOSIS — M5137 Other intervertebral disc degeneration, lumbosacral region: Secondary | ICD-10-CM | POA: Diagnosis not present

## 2021-10-09 DIAGNOSIS — M9903 Segmental and somatic dysfunction of lumbar region: Secondary | ICD-10-CM | POA: Diagnosis not present

## 2021-10-09 DIAGNOSIS — M62830 Muscle spasm of back: Secondary | ICD-10-CM | POA: Diagnosis not present

## 2021-10-09 DIAGNOSIS — M9904 Segmental and somatic dysfunction of sacral region: Secondary | ICD-10-CM | POA: Diagnosis not present

## 2021-10-09 DIAGNOSIS — M5442 Lumbago with sciatica, left side: Secondary | ICD-10-CM | POA: Diagnosis not present

## 2021-11-01 DIAGNOSIS — E039 Hypothyroidism, unspecified: Secondary | ICD-10-CM | POA: Diagnosis not present

## 2021-11-01 DIAGNOSIS — E042 Nontoxic multinodular goiter: Secondary | ICD-10-CM | POA: Diagnosis not present

## 2021-11-06 DIAGNOSIS — M5442 Lumbago with sciatica, left side: Secondary | ICD-10-CM | POA: Diagnosis not present

## 2021-11-06 DIAGNOSIS — M62830 Muscle spasm of back: Secondary | ICD-10-CM | POA: Diagnosis not present

## 2021-11-06 DIAGNOSIS — M9903 Segmental and somatic dysfunction of lumbar region: Secondary | ICD-10-CM | POA: Diagnosis not present

## 2021-11-06 DIAGNOSIS — M5136 Other intervertebral disc degeneration, lumbar region: Secondary | ICD-10-CM | POA: Diagnosis not present

## 2021-11-06 DIAGNOSIS — M5137 Other intervertebral disc degeneration, lumbosacral region: Secondary | ICD-10-CM | POA: Diagnosis not present

## 2021-11-06 DIAGNOSIS — M9904 Segmental and somatic dysfunction of sacral region: Secondary | ICD-10-CM | POA: Diagnosis not present

## 2021-11-08 DIAGNOSIS — Z79890 Hormone replacement therapy (postmenopausal): Secondary | ICD-10-CM | POA: Diagnosis not present

## 2021-11-08 DIAGNOSIS — Z6841 Body Mass Index (BMI) 40.0 and over, adult: Secondary | ICD-10-CM | POA: Diagnosis not present

## 2021-11-08 DIAGNOSIS — E042 Nontoxic multinodular goiter: Secondary | ICD-10-CM | POA: Diagnosis not present

## 2021-11-08 DIAGNOSIS — E038 Other specified hypothyroidism: Secondary | ICD-10-CM | POA: Diagnosis not present

## 2021-11-08 DIAGNOSIS — E063 Autoimmune thyroiditis: Secondary | ICD-10-CM | POA: Diagnosis not present

## 2021-12-11 DIAGNOSIS — M5137 Other intervertebral disc degeneration, lumbosacral region: Secondary | ICD-10-CM | POA: Diagnosis not present

## 2021-12-11 DIAGNOSIS — M9904 Segmental and somatic dysfunction of sacral region: Secondary | ICD-10-CM | POA: Diagnosis not present

## 2021-12-11 DIAGNOSIS — M5136 Other intervertebral disc degeneration, lumbar region: Secondary | ICD-10-CM | POA: Diagnosis not present

## 2021-12-11 DIAGNOSIS — M62830 Muscle spasm of back: Secondary | ICD-10-CM | POA: Diagnosis not present

## 2021-12-11 DIAGNOSIS — M9903 Segmental and somatic dysfunction of lumbar region: Secondary | ICD-10-CM | POA: Diagnosis not present

## 2021-12-11 DIAGNOSIS — M5442 Lumbago with sciatica, left side: Secondary | ICD-10-CM | POA: Diagnosis not present

## 2021-12-25 DIAGNOSIS — M62830 Muscle spasm of back: Secondary | ICD-10-CM | POA: Diagnosis not present

## 2021-12-25 DIAGNOSIS — M9903 Segmental and somatic dysfunction of lumbar region: Secondary | ICD-10-CM | POA: Diagnosis not present

## 2021-12-25 DIAGNOSIS — M5137 Other intervertebral disc degeneration, lumbosacral region: Secondary | ICD-10-CM | POA: Diagnosis not present

## 2021-12-25 DIAGNOSIS — M5442 Lumbago with sciatica, left side: Secondary | ICD-10-CM | POA: Diagnosis not present

## 2021-12-25 DIAGNOSIS — M5136 Other intervertebral disc degeneration, lumbar region: Secondary | ICD-10-CM | POA: Diagnosis not present

## 2021-12-25 DIAGNOSIS — M9904 Segmental and somatic dysfunction of sacral region: Secondary | ICD-10-CM | POA: Diagnosis not present

## 2021-12-31 ENCOUNTER — Ambulatory Visit (INDEPENDENT_AMBULATORY_CARE_PROVIDER_SITE_OTHER): Payer: Medicare Other | Admitting: Cardiovascular Disease

## 2021-12-31 ENCOUNTER — Encounter: Payer: Self-pay | Admitting: Cardiovascular Disease

## 2021-12-31 ENCOUNTER — Other Ambulatory Visit: Payer: Self-pay

## 2021-12-31 DIAGNOSIS — E782 Mixed hyperlipidemia: Secondary | ICD-10-CM | POA: Diagnosis not present

## 2021-12-31 DIAGNOSIS — I480 Paroxysmal atrial fibrillation: Secondary | ICD-10-CM

## 2021-12-31 DIAGNOSIS — G4733 Obstructive sleep apnea (adult) (pediatric): Secondary | ICD-10-CM | POA: Diagnosis not present

## 2021-12-31 NOTE — Assessment & Plan Note (Signed)
History of hyperlipidemia resistant to taking statin therapy.  Last lipid profile performed 08/09/2020 revealed total cholesterol 205, LDL 134 and HDL of 38.  She was offered ezetimibe but did not ever take that.  I did refer her to Dr. Blanchie Dessert lipid clinic.  Unfortunately, I do not think that she is a candidate for PCSK9 based on Dr. Blanchie Dessert notes. ?

## 2021-12-31 NOTE — Progress Notes (Signed)
? ? ? ?12/31/2021 ?Shannon Griffin   ?1946-04-11  ?[id number] ? ?Primary Physician Angelina Sheriff, MD ?Primary Cardiologist: Lorretta Harp MD Lupe Carney, [place] ? ?HPI:  Shannon Griffin is a 76 y.o.  moderately overweight widowed Caucasian female mother of one daughter, grandmother and one grandchild who I last saw  03/12/2021. She relocated from [place] to be closer to her family. Her history is remarkable for paroxysmal atrial fibrillation and mild hyperlipidemia as well as obesity and obstructive sleep apnea. She wears C Pap which she benefits from.. She denies chest pain or shortness of breath. She had a 2-D echocardiogram performed in the past that showed normal LV size and function with moderate to severe left atrial enlargement. Since I saw her a year ago she's remained stable. She does work in the yard. She is unable to lose weight however. She gets occasional episodes of palpitations which may be brief though infrequent episodes of PAF currently in sinus rhythm. The CHA2DSVASC2 score is 2  . We discussed the risks and benefits of oral anticoagulation with regard to stroke prophylaxis. She is currently on Eliquis oral anticoagulation. Since I saw her year ago she's remained asymptomatic specifically denying chest pain. She does have some mild dyspnea on exertion probably multifactorial related to obesity and deconditioning ?  ?She did have an episode of A. fib with RVR brought her to the emergency room on 12/05/2018.  She underwent DC cardioversion by Dr. Venora Maples successfully back to sinus rhythm.  She saw Roderic Palau, NP in the A. fib clinic several days later in sinus rhythm.  Otherwise, she gets some dyspnea on exertion.  She denies chest pain.  She continues to wear CPAP for her sleep apnea.  She is had little success with weight loss however. ?  ? She has occasional episodes of PAF with which resolve quickly and spontaneously.  She really has been unsuccessful with weight loss.  She does wear  her CPAP.  She denies chest pain or shortness of breath but does complain of daytime somnolence.  She had a lipid profile performed 10/11/2019 that revealed total cholesterol 207 with an LDL of 135.  She is resistant to starting statin therapy.  I referred her to Dr. Debara Pickett.  She was started on Zetia which was ineffective.  She went on a diet which did not affect her lipid profile.  PCSK9 was raised in her discussion with Dr. Debara Pickett which she was hesitant to pursue at that time but now is receptive. ?  ?Since I saw her 10 months ago she continues to do well.  She is still followed in the A-fib clinic and last saw them in August of last year when she had her last episode of PAF.  I did refer her to Dr. Claiborne Billings for sleep apnea and she currently wears CPAP as well as Dr. Debara Pickett for her hyperlipidemia although she is resistant to taking pharmacologic therapy.  In addition, I referred her to the diet wellness center however she never was able to be seen for logistical reasons. ? ? ?Current Meds  ?Medication Sig  ? bisoprolol (ZEBETA) 5 MG tablet Take 1/2 tablet by mouth in the am and 1 tablet by mouth in the evening  ? diltiazem (CARDIZEM) 30 MG tablet Take 1 tablet every 4 hours AS NEEDED for heart rate >100  ? ELDERBERRY PO Take 1 tablet by mouth daily.  ? ELIQUIS 5 MG TABS tablet TAKE 1 TABLET TWICE A DAY  ?  levothyroxine (SYNTHROID, LEVOTHROID) 112 MCG tablet Take 112 mcg by mouth daily before breakfast. Take 112 mcg by mouth 6 days a week and 56 mcg on the 7th day  ? Multiple Vitamins-Minerals (MULTIVITAMIN WITH MINERALS) tablet Take 1 tablet by mouth daily.  ? vitamin C (ASCORBIC ACID) 500 MG tablet Take 500 mg by mouth daily.  ? vitamin E 400 UNIT capsule Take 400 Units by mouth daily.  ?  ? ?Allergies  ?Allergen Reactions  ? Freeze It [Camphor-Menthol] Swelling  ?  Histofreeze  ? Lidocaine Swelling  ? ? ?Social History  ? ?Socioeconomic History  ? Marital status: Widowed  ?  Spouse name: Not on file  ? Number of  children: Not on file  ? Years of education: Not on file  ? Highest education level: Not on file  ?Occupational History  ? Not on file  ?Tobacco Use  ? Smoking status: Never  ? Smokeless tobacco: Never  ?Substance and Sexual Activity  ? Alcohol use: No  ? Drug use: No  ? Sexual activity: Never  ?Other Topics Concern  ? Not on file  ?Social History Narrative  ? Not on file  ? ?Social Determinants of Health  ? ?Financial Resource Strain: Not on file  ?Food Insecurity: Not on file  ?Transportation Needs: Not on file  ?Physical Activity: Not on file  ?Stress: Not on file  ?Social Connections: Not on file  ?Intimate Partner Violence: Not on file  ?  ? ?Review of Systems: ?General: negative for chills, fever, night sweats or weight changes.  ?Cardiovascular: negative for chest pain, dyspnea on exertion, edema, orthopnea, palpitations, paroxysmal nocturnal dyspnea or shortness of breath ?Dermatological: negative for rash ?Respiratory: negative for cough or wheezing ?Urologic: negative for hematuria ?Abdominal: negative for nausea, vomiting, diarrhea, bright red blood per rectum, melena, or hematemesis ?Neurologic: negative for visual changes, syncope, or dizziness ?All other systems reviewed and are otherwise negative except as noted above. ? ? ? ?Blood pressure 140/77, pulse 62, height 5' 1.5" (1.562 m), weight 235 lb 3.2 oz (106.7 kg), SpO2 99 %.  ?General appearance: alert and no distress ?Neck: no adenopathy, no carotid bruit, no JVD, supple, symmetrical, trachea midline, and thyroid not enlarged, symmetric, no tenderness/mass/nodules ?Lungs: clear to auscultation bilaterally ?Heart: regular rate and rhythm, S1, S2 normal, no murmur, click, rub or gallop ?Extremities: extremities normal, atraumatic, no cyanosis or edema ?Pulses: 2+ and symmetric ?Skin: Skin color, texture, turgor normal. No rashes or lesions ?Neurologic: Grossly normal ? ?EKG sinus rhythm at 62 without ST or T wave changes.  I personally reviewed this  EKG. ? ?ASSESSMENT AND PLAN:  ? ?Paroxysmal atrial fibrillation (HCC) ?History of paroxysmal atrial fibrillation on Eliquis oral anticoagulation maintaining sinus rhythm.  Her last episode of PAF was back in August 2022. ? ?Hyperlipidemia ?History of hyperlipidemia resistant to taking statin therapy.  Last lipid profile performed 08/09/2020 revealed total cholesterol 205, LDL 134 and HDL of 38.  She was offered ezetimibe but did not ever take that.  I did refer her to Dr. Lysbeth Penner lipid clinic.  Unfortunately, I do not think that she is a candidate for PCSK9 based on Dr. Lysbeth Penner notes. ? ?Obstructive sleep apnea ?History of obstructive sleep apnea on CPAP which she benefits from ? ?Morbid obesity ([place]) ?History of morbid obesity.  I referred her to the diet wellness center however she never was able to see them because of scheduling issues. ? ? ? ? ?Lorretta Harp MD FACP,FACC,FAHA, FSCAI ?12/31/2021 ?9:02  AM ?

## 2021-12-31 NOTE — Assessment & Plan Note (Signed)
History of paroxysmal atrial fibrillation on Eliquis oral anticoagulation maintaining sinus rhythm.  Her last episode of PAF was back in August 2022. ?

## 2021-12-31 NOTE — Patient Instructions (Addendum)
Medication Instructions:  ?Your physician recommends that you continue on your current medications as directed. Please refer to the Current Medication list given to you today. ? ?*If you need a refill on your cardiac medications before your next appointment, please call your pharmacy* ? ? ?Follow-Up: ?At [facility], you and your health needs are our priority.  As part of our continuing mission to provide you with exceptional heart care, we have created designated Provider Care Teams.  These Care Teams include your primary Cardiologist (physician) and Advanced Practice Providers (APPs -  Physician Assistants and Nurse Practitioners) who all work together to provide you with the care you need, when you need it. ? ?We recommend signing up for the patient portal called "MyChart".  Sign up information is provided on this After Visit Summary.  MyChart is used to connect with patients for Virtual Visits (Telemedicine).  Patients are able to view lab/test results, encounter notes, upcoming appointments, etc.  Non-urgent messages can be sent to your provider as well.   ?To learn more about what you can do with MyChart, go to [email or web address].   ? ?Your next appointment:   ?12 month(s) ? ?The format for your next appointment:   ?In Person ? ?Provider:   ?Quay Burow, MD  ? ? ?Other Instructions ?Tylenol and topical voltaren gel/cream ? ?

## 2021-12-31 NOTE — Assessment & Plan Note (Signed)
History of morbid obesity.  I referred her to the [facility] however she never was able to see them because of scheduling issues. ?

## 2021-12-31 NOTE — Assessment & Plan Note (Signed)
History of obstructive sleep apnea on CPAP which she benefits from 

## 2022-01-07 DIAGNOSIS — H26493 Other secondary cataract, bilateral: Secondary | ICD-10-CM | POA: Diagnosis not present

## 2022-01-15 DIAGNOSIS — M62830 Muscle spasm of back: Secondary | ICD-10-CM | POA: Diagnosis not present

## 2022-01-15 DIAGNOSIS — M5136 Other intervertebral disc degeneration, lumbar region: Secondary | ICD-10-CM | POA: Diagnosis not present

## 2022-01-15 DIAGNOSIS — M5137 Other intervertebral disc degeneration, lumbosacral region: Secondary | ICD-10-CM | POA: Diagnosis not present

## 2022-01-15 DIAGNOSIS — M9903 Segmental and somatic dysfunction of lumbar region: Secondary | ICD-10-CM | POA: Diagnosis not present

## 2022-01-15 DIAGNOSIS — M9904 Segmental and somatic dysfunction of sacral region: Secondary | ICD-10-CM | POA: Diagnosis not present

## 2022-01-15 DIAGNOSIS — M5442 Lumbago with sciatica, left side: Secondary | ICD-10-CM | POA: Diagnosis not present

## 2022-01-31 ENCOUNTER — Other Ambulatory Visit: Payer: Self-pay | Admitting: Cardiovascular Disease

## 2022-01-31 NOTE — Telephone Encounter (Signed)
Prescription refill request for Eliquis received. ?Indication:Afib ?Last office visit:3/23 ?Scr:0.7 ?Age: 76 ?Weight:106.7 kg ? ?Prescription refilled ? ?

## 2022-02-03 ENCOUNTER — Other Ambulatory Visit: Payer: Self-pay | Admitting: Cardiovascular Disease

## 2022-02-05 DIAGNOSIS — M9904 Segmental and somatic dysfunction of sacral region: Secondary | ICD-10-CM | POA: Diagnosis not present

## 2022-02-05 DIAGNOSIS — M9903 Segmental and somatic dysfunction of lumbar region: Secondary | ICD-10-CM | POA: Diagnosis not present

## 2022-02-05 DIAGNOSIS — M62830 Muscle spasm of back: Secondary | ICD-10-CM | POA: Diagnosis not present

## 2022-02-05 DIAGNOSIS — M5442 Lumbago with sciatica, left side: Secondary | ICD-10-CM | POA: Diagnosis not present

## 2022-02-05 DIAGNOSIS — M5137 Other intervertebral disc degeneration, lumbosacral region: Secondary | ICD-10-CM | POA: Diagnosis not present

## 2022-02-05 DIAGNOSIS — M5136 Other intervertebral disc degeneration, lumbar region: Secondary | ICD-10-CM | POA: Diagnosis not present

## 2022-02-19 DIAGNOSIS — M62830 Muscle spasm of back: Secondary | ICD-10-CM | POA: Diagnosis not present

## 2022-02-19 DIAGNOSIS — M5137 Other intervertebral disc degeneration, lumbosacral region: Secondary | ICD-10-CM | POA: Diagnosis not present

## 2022-02-19 DIAGNOSIS — M5136 Other intervertebral disc degeneration, lumbar region: Secondary | ICD-10-CM | POA: Diagnosis not present

## 2022-02-19 DIAGNOSIS — M5442 Lumbago with sciatica, left side: Secondary | ICD-10-CM | POA: Diagnosis not present

## 2022-02-19 DIAGNOSIS — M9903 Segmental and somatic dysfunction of lumbar region: Secondary | ICD-10-CM | POA: Diagnosis not present

## 2022-02-19 DIAGNOSIS — M9904 Segmental and somatic dysfunction of sacral region: Secondary | ICD-10-CM | POA: Diagnosis not present

## 2022-04-09 DIAGNOSIS — M5137 Other intervertebral disc degeneration, lumbosacral region: Secondary | ICD-10-CM | POA: Diagnosis not present

## 2022-04-09 DIAGNOSIS — M5136 Other intervertebral disc degeneration, lumbar region: Secondary | ICD-10-CM | POA: Diagnosis not present

## 2022-04-09 DIAGNOSIS — M5442 Lumbago with sciatica, left side: Secondary | ICD-10-CM | POA: Diagnosis not present

## 2022-04-09 DIAGNOSIS — M9903 Segmental and somatic dysfunction of lumbar region: Secondary | ICD-10-CM | POA: Diagnosis not present

## 2022-04-09 DIAGNOSIS — M9904 Segmental and somatic dysfunction of sacral region: Secondary | ICD-10-CM | POA: Diagnosis not present

## 2022-04-09 DIAGNOSIS — M62830 Muscle spasm of back: Secondary | ICD-10-CM | POA: Diagnosis not present

## 2022-04-16 DIAGNOSIS — M9903 Segmental and somatic dysfunction of lumbar region: Secondary | ICD-10-CM | POA: Diagnosis not present

## 2022-04-16 DIAGNOSIS — M5137 Other intervertebral disc degeneration, lumbosacral region: Secondary | ICD-10-CM | POA: Diagnosis not present

## 2022-04-16 DIAGNOSIS — M62830 Muscle spasm of back: Secondary | ICD-10-CM | POA: Diagnosis not present

## 2022-04-16 DIAGNOSIS — M5136 Other intervertebral disc degeneration, lumbar region: Secondary | ICD-10-CM | POA: Diagnosis not present

## 2022-04-16 DIAGNOSIS — M5442 Lumbago with sciatica, left side: Secondary | ICD-10-CM | POA: Diagnosis not present

## 2022-04-16 DIAGNOSIS — M9904 Segmental and somatic dysfunction of sacral region: Secondary | ICD-10-CM | POA: Diagnosis not present

## 2022-04-23 DIAGNOSIS — M9903 Segmental and somatic dysfunction of lumbar region: Secondary | ICD-10-CM | POA: Diagnosis not present

## 2022-04-23 DIAGNOSIS — M9904 Segmental and somatic dysfunction of sacral region: Secondary | ICD-10-CM | POA: Diagnosis not present

## 2022-04-23 DIAGNOSIS — M5137 Other intervertebral disc degeneration, lumbosacral region: Secondary | ICD-10-CM | POA: Diagnosis not present

## 2022-04-23 DIAGNOSIS — M5136 Other intervertebral disc degeneration, lumbar region: Secondary | ICD-10-CM | POA: Diagnosis not present

## 2022-04-23 DIAGNOSIS — M5442 Lumbago with sciatica, left side: Secondary | ICD-10-CM | POA: Diagnosis not present

## 2022-04-23 DIAGNOSIS — M62830 Muscle spasm of back: Secondary | ICD-10-CM | POA: Diagnosis not present

## 2022-05-07 DIAGNOSIS — M5136 Other intervertebral disc degeneration, lumbar region: Secondary | ICD-10-CM | POA: Diagnosis not present

## 2022-05-07 DIAGNOSIS — M5137 Other intervertebral disc degeneration, lumbosacral region: Secondary | ICD-10-CM | POA: Diagnosis not present

## 2022-05-07 DIAGNOSIS — M5442 Lumbago with sciatica, left side: Secondary | ICD-10-CM | POA: Diagnosis not present

## 2022-05-07 DIAGNOSIS — M9904 Segmental and somatic dysfunction of sacral region: Secondary | ICD-10-CM | POA: Diagnosis not present

## 2022-05-07 DIAGNOSIS — M9903 Segmental and somatic dysfunction of lumbar region: Secondary | ICD-10-CM | POA: Diagnosis not present

## 2022-05-07 DIAGNOSIS — M62830 Muscle spasm of back: Secondary | ICD-10-CM | POA: Diagnosis not present

## 2022-05-21 DIAGNOSIS — M5136 Other intervertebral disc degeneration, lumbar region: Secondary | ICD-10-CM | POA: Diagnosis not present

## 2022-05-21 DIAGNOSIS — M9904 Segmental and somatic dysfunction of sacral region: Secondary | ICD-10-CM | POA: Diagnosis not present

## 2022-05-21 DIAGNOSIS — M62830 Muscle spasm of back: Secondary | ICD-10-CM | POA: Diagnosis not present

## 2022-05-21 DIAGNOSIS — M5442 Lumbago with sciatica, left side: Secondary | ICD-10-CM | POA: Diagnosis not present

## 2022-05-21 DIAGNOSIS — M9903 Segmental and somatic dysfunction of lumbar region: Secondary | ICD-10-CM | POA: Diagnosis not present

## 2022-05-21 DIAGNOSIS — M5137 Other intervertebral disc degeneration, lumbosacral region: Secondary | ICD-10-CM | POA: Diagnosis not present

## 2022-06-04 DIAGNOSIS — M9904 Segmental and somatic dysfunction of sacral region: Secondary | ICD-10-CM | POA: Diagnosis not present

## 2022-06-04 DIAGNOSIS — M5442 Lumbago with sciatica, left side: Secondary | ICD-10-CM | POA: Diagnosis not present

## 2022-06-04 DIAGNOSIS — M5137 Other intervertebral disc degeneration, lumbosacral region: Secondary | ICD-10-CM | POA: Diagnosis not present

## 2022-06-04 DIAGNOSIS — M62830 Muscle spasm of back: Secondary | ICD-10-CM | POA: Diagnosis not present

## 2022-06-04 DIAGNOSIS — M5136 Other intervertebral disc degeneration, lumbar region: Secondary | ICD-10-CM | POA: Diagnosis not present

## 2022-06-04 DIAGNOSIS — M9903 Segmental and somatic dysfunction of lumbar region: Secondary | ICD-10-CM | POA: Diagnosis not present

## 2022-06-18 DIAGNOSIS — M5136 Other intervertebral disc degeneration, lumbar region: Secondary | ICD-10-CM | POA: Diagnosis not present

## 2022-06-18 DIAGNOSIS — M9904 Segmental and somatic dysfunction of sacral region: Secondary | ICD-10-CM | POA: Diagnosis not present

## 2022-06-18 DIAGNOSIS — M5137 Other intervertebral disc degeneration, lumbosacral region: Secondary | ICD-10-CM | POA: Diagnosis not present

## 2022-06-18 DIAGNOSIS — M5442 Lumbago with sciatica, left side: Secondary | ICD-10-CM | POA: Diagnosis not present

## 2022-06-18 DIAGNOSIS — M9903 Segmental and somatic dysfunction of lumbar region: Secondary | ICD-10-CM | POA: Diagnosis not present

## 2022-06-18 DIAGNOSIS — M62830 Muscle spasm of back: Secondary | ICD-10-CM | POA: Diagnosis not present

## 2022-07-02 DIAGNOSIS — M9904 Segmental and somatic dysfunction of sacral region: Secondary | ICD-10-CM | POA: Diagnosis not present

## 2022-07-02 DIAGNOSIS — M9903 Segmental and somatic dysfunction of lumbar region: Secondary | ICD-10-CM | POA: Diagnosis not present

## 2022-07-02 DIAGNOSIS — M5137 Other intervertebral disc degeneration, lumbosacral region: Secondary | ICD-10-CM | POA: Diagnosis not present

## 2022-07-02 DIAGNOSIS — M5136 Other intervertebral disc degeneration, lumbar region: Secondary | ICD-10-CM | POA: Diagnosis not present

## 2022-07-02 DIAGNOSIS — M62830 Muscle spasm of back: Secondary | ICD-10-CM | POA: Diagnosis not present

## 2022-07-02 DIAGNOSIS — M5442 Lumbago with sciatica, left side: Secondary | ICD-10-CM | POA: Diagnosis not present

## 2022-07-08 DIAGNOSIS — H26491 Other secondary cataract, right eye: Secondary | ICD-10-CM | POA: Diagnosis not present

## 2022-07-16 DIAGNOSIS — M5442 Lumbago with sciatica, left side: Secondary | ICD-10-CM | POA: Diagnosis not present

## 2022-07-16 DIAGNOSIS — M62830 Muscle spasm of back: Secondary | ICD-10-CM | POA: Diagnosis not present

## 2022-07-16 DIAGNOSIS — M5137 Other intervertebral disc degeneration, lumbosacral region: Secondary | ICD-10-CM | POA: Diagnosis not present

## 2022-07-16 DIAGNOSIS — M5136 Other intervertebral disc degeneration, lumbar region: Secondary | ICD-10-CM | POA: Diagnosis not present

## 2022-07-16 DIAGNOSIS — M9903 Segmental and somatic dysfunction of lumbar region: Secondary | ICD-10-CM | POA: Diagnosis not present

## 2022-07-16 DIAGNOSIS — M9904 Segmental and somatic dysfunction of sacral region: Secondary | ICD-10-CM | POA: Diagnosis not present

## 2022-07-17 ENCOUNTER — Other Ambulatory Visit: Payer: Self-pay | Admitting: Cardiovascular Disease

## 2022-07-17 DIAGNOSIS — I480 Paroxysmal atrial fibrillation: Secondary | ICD-10-CM

## 2022-07-17 NOTE — Telephone Encounter (Signed)
Prescription refill request for Eliquis received. Indication: Afib  Last office visit: 12/31/21 Gwenlyn Found)  Scr: 0.75 (05/28/21) Age: 76 Weight: 106.7kg   Labs overdue. Confirmed no labs have been drawn at PCP since 2018. Pt has appt with Dr Gwenlyn Found on 01/11/23. Placed not on appt to have labs drawn. Appropriate dose and refill sent to requested pharmacy.

## 2022-07-30 DIAGNOSIS — M5136 Other intervertebral disc degeneration, lumbar region: Secondary | ICD-10-CM | POA: Diagnosis not present

## 2022-07-30 DIAGNOSIS — M9904 Segmental and somatic dysfunction of sacral region: Secondary | ICD-10-CM | POA: Diagnosis not present

## 2022-07-30 DIAGNOSIS — M9903 Segmental and somatic dysfunction of lumbar region: Secondary | ICD-10-CM | POA: Diagnosis not present

## 2022-07-30 DIAGNOSIS — M5137 Other intervertebral disc degeneration, lumbosacral region: Secondary | ICD-10-CM | POA: Diagnosis not present

## 2022-07-30 DIAGNOSIS — M5442 Lumbago with sciatica, left side: Secondary | ICD-10-CM | POA: Diagnosis not present

## 2022-07-30 DIAGNOSIS — M62830 Muscle spasm of back: Secondary | ICD-10-CM | POA: Diagnosis not present

## 2022-08-20 DIAGNOSIS — M62830 Muscle spasm of back: Secondary | ICD-10-CM | POA: Diagnosis not present

## 2022-08-20 DIAGNOSIS — M5136 Other intervertebral disc degeneration, lumbar region: Secondary | ICD-10-CM | POA: Diagnosis not present

## 2022-08-20 DIAGNOSIS — M9904 Segmental and somatic dysfunction of sacral region: Secondary | ICD-10-CM | POA: Diagnosis not present

## 2022-08-20 DIAGNOSIS — M5137 Other intervertebral disc degeneration, lumbosacral region: Secondary | ICD-10-CM | POA: Diagnosis not present

## 2022-08-20 DIAGNOSIS — M5442 Lumbago with sciatica, left side: Secondary | ICD-10-CM | POA: Diagnosis not present

## 2022-08-20 DIAGNOSIS — M9903 Segmental and somatic dysfunction of lumbar region: Secondary | ICD-10-CM | POA: Diagnosis not present

## 2022-09-10 DIAGNOSIS — M5442 Lumbago with sciatica, left side: Secondary | ICD-10-CM | POA: Diagnosis not present

## 2022-09-10 DIAGNOSIS — M62830 Muscle spasm of back: Secondary | ICD-10-CM | POA: Diagnosis not present

## 2022-09-10 DIAGNOSIS — M5136 Other intervertebral disc degeneration, lumbar region: Secondary | ICD-10-CM | POA: Diagnosis not present

## 2022-09-10 DIAGNOSIS — M9903 Segmental and somatic dysfunction of lumbar region: Secondary | ICD-10-CM | POA: Diagnosis not present

## 2022-09-10 DIAGNOSIS — M9904 Segmental and somatic dysfunction of sacral region: Secondary | ICD-10-CM | POA: Diagnosis not present

## 2022-09-10 DIAGNOSIS — M5137 Other intervertebral disc degeneration, lumbosacral region: Secondary | ICD-10-CM | POA: Diagnosis not present

## 2022-10-01 DIAGNOSIS — M9903 Segmental and somatic dysfunction of lumbar region: Secondary | ICD-10-CM | POA: Diagnosis not present

## 2022-10-01 DIAGNOSIS — M62830 Muscle spasm of back: Secondary | ICD-10-CM | POA: Diagnosis not present

## 2022-10-01 DIAGNOSIS — M9904 Segmental and somatic dysfunction of sacral region: Secondary | ICD-10-CM | POA: Diagnosis not present

## 2022-10-01 DIAGNOSIS — M5442 Lumbago with sciatica, left side: Secondary | ICD-10-CM | POA: Diagnosis not present

## 2022-10-01 DIAGNOSIS — M5137 Other intervertebral disc degeneration, lumbosacral region: Secondary | ICD-10-CM | POA: Diagnosis not present

## 2022-10-01 DIAGNOSIS — M5136 Other intervertebral disc degeneration, lumbar region: Secondary | ICD-10-CM | POA: Diagnosis not present

## 2022-10-29 DIAGNOSIS — M5136 Other intervertebral disc degeneration, lumbar region: Secondary | ICD-10-CM | POA: Diagnosis not present

## 2022-10-29 DIAGNOSIS — M5137 Other intervertebral disc degeneration, lumbosacral region: Secondary | ICD-10-CM | POA: Diagnosis not present

## 2022-10-29 DIAGNOSIS — M62830 Muscle spasm of back: Secondary | ICD-10-CM | POA: Diagnosis not present

## 2022-10-29 DIAGNOSIS — M9903 Segmental and somatic dysfunction of lumbar region: Secondary | ICD-10-CM | POA: Diagnosis not present

## 2022-10-29 DIAGNOSIS — M5442 Lumbago with sciatica, left side: Secondary | ICD-10-CM | POA: Diagnosis not present

## 2022-10-29 DIAGNOSIS — M9904 Segmental and somatic dysfunction of sacral region: Secondary | ICD-10-CM | POA: Diagnosis not present

## 2022-11-06 DIAGNOSIS — E039 Hypothyroidism, unspecified: Secondary | ICD-10-CM | POA: Diagnosis not present

## 2022-11-06 DIAGNOSIS — E063 Autoimmune thyroiditis: Secondary | ICD-10-CM | POA: Diagnosis not present

## 2022-11-11 DIAGNOSIS — E042 Nontoxic multinodular goiter: Secondary | ICD-10-CM | POA: Diagnosis not present

## 2022-11-11 DIAGNOSIS — E063 Autoimmune thyroiditis: Secondary | ICD-10-CM | POA: Diagnosis not present

## 2022-11-11 DIAGNOSIS — E038 Other specified hypothyroidism: Secondary | ICD-10-CM | POA: Diagnosis not present

## 2022-11-11 DIAGNOSIS — Z6841 Body Mass Index (BMI) 40.0 and over, adult: Secondary | ICD-10-CM | POA: Diagnosis not present

## 2022-11-11 DIAGNOSIS — Z79890 Hormone replacement therapy (postmenopausal): Secondary | ICD-10-CM | POA: Diagnosis not present

## 2022-11-12 ENCOUNTER — Telehealth: Payer: Self-pay | Admitting: Cardiovascular Disease

## 2022-11-12 MED ORDER — BISOPROLOL FUMARATE 5 MG PO TABS: ORAL_TABLET | ORAL | 2 refills | Status: DC

## 2022-11-12 NOTE — Telephone Encounter (Signed)
*  STAT* If patient is at the pharmacy, call can be transferred to refill team.   1. Which medications need to be refilled? (please list name of each medication and dose if known) bisoprolol (ZEBETA) 5 MG tablet  *[employer] MFR*   2. Which pharmacy/location (including street and city if local pharmacy) is medication to be sent to? [place], [place]  Fax [phone]  3. Do they need a 30 day or 90 day supply? 90 day

## 2022-11-19 DIAGNOSIS — M5136 Other intervertebral disc degeneration, lumbar region: Secondary | ICD-10-CM | POA: Diagnosis not present

## 2022-11-19 DIAGNOSIS — M9904 Segmental and somatic dysfunction of sacral region: Secondary | ICD-10-CM | POA: Diagnosis not present

## 2022-11-19 DIAGNOSIS — M5137 Other intervertebral disc degeneration, lumbosacral region: Secondary | ICD-10-CM | POA: Diagnosis not present

## 2022-11-19 DIAGNOSIS — M62830 Muscle spasm of back: Secondary | ICD-10-CM | POA: Diagnosis not present

## 2022-11-19 DIAGNOSIS — M5442 Lumbago with sciatica, left side: Secondary | ICD-10-CM | POA: Diagnosis not present

## 2022-11-19 DIAGNOSIS — M9903 Segmental and somatic dysfunction of lumbar region: Secondary | ICD-10-CM | POA: Diagnosis not present

## 2022-11-21 ENCOUNTER — Other Ambulatory Visit: Payer: Self-pay

## 2022-11-21 ENCOUNTER — Telehealth: Payer: Self-pay | Admitting: Cardiovascular Disease

## 2022-11-21 DIAGNOSIS — I480 Paroxysmal atrial fibrillation: Secondary | ICD-10-CM

## 2022-11-21 MED ORDER — BISOPROLOL FUMARATE 5 MG PO TABS: ORAL_TABLET | ORAL | 0 refills | Status: DC

## 2022-11-21 MED ORDER — APIXABAN 5 MG PO TABS: 5.0000 mg | ORAL_TABLET | Freq: Two times a day (BID) | ORAL | 1 refills | Status: DC

## 2022-11-21 NOTE — Telephone Encounter (Signed)
Prescription refill request for Eliquis received. Indication:afib Last office visit:3/23 ZES:[id number] 3/24 Age: 77 Weight:106.7  kg  Prescription refilled

## 2022-11-21 NOTE — Telephone Encounter (Signed)
*  STAT* If patient is at the pharmacy, call can be transferred to refill team.   1. Which medications need to be refilled? (please list name of each medication and dose if known)  bisoprolol (ZEBETA) 5 MG tablet  ELIQUIS 5 MG TABS tablet   2. Which pharmacy/location (including street and city if local pharmacy) is medication to be sent to?  [place], [place]   3. Do they need a 30 day or 90 day supply? 30 day  Patient states the bisoprolol it needs to come from [employer]. She needs a 30 day supply sent to her local pharmacy, due to mail order having issues filling it.

## 2022-11-21 NOTE — Telephone Encounter (Signed)
Bisoprolol refilled Eliquis request routed to anticoag team

## 2022-11-27 ENCOUNTER — Encounter (HOSPITAL_COMMUNITY): Payer: Self-pay | Admitting: *Deleted

## 2022-12-17 DIAGNOSIS — M9904 Segmental and somatic dysfunction of sacral region: Secondary | ICD-10-CM | POA: Diagnosis not present

## 2022-12-17 DIAGNOSIS — M62830 Muscle spasm of back: Secondary | ICD-10-CM | POA: Diagnosis not present

## 2022-12-17 DIAGNOSIS — M9903 Segmental and somatic dysfunction of lumbar region: Secondary | ICD-10-CM | POA: Diagnosis not present

## 2022-12-17 DIAGNOSIS — M5137 Other intervertebral disc degeneration, lumbosacral region: Secondary | ICD-10-CM | POA: Diagnosis not present

## 2022-12-17 DIAGNOSIS — M5136 Other intervertebral disc degeneration, lumbar region: Secondary | ICD-10-CM | POA: Diagnosis not present

## 2022-12-17 DIAGNOSIS — M5442 Lumbago with sciatica, left side: Secondary | ICD-10-CM | POA: Diagnosis not present

## 2022-12-22 ENCOUNTER — Telehealth: Payer: Self-pay | Admitting: Cardiovascular Disease

## 2022-12-22 DIAGNOSIS — I480 Paroxysmal atrial fibrillation: Secondary | ICD-10-CM

## 2022-12-22 NOTE — Telephone Encounter (Signed)
Spoke with pt regarding her prescription for bisoprolol. Pt states that her mail order pharmacy can not fill her bisoprolol, so she had it sent to her local pharmacy. Pt states that she doesn't need anything else at this time.

## 2022-12-22 NOTE — Telephone Encounter (Signed)
Pt c/o medication issue:  1. Name of Medication: bisoprolol (ZEBETA) 5 MG tablet   2. How are you currently taking this medication (dosage and times per day)? TAKE 1/2 TABLET EVERY MORNING AND 1 TABLET EVERY EVENING   3. Are you having a reaction (difficulty breathing--STAT)? No   4. What is your medication issue? Pt is calling to let the office know that the mail order company; [facility] [place], was supposed to be calling in to get the script changed to a different med, because they don't have the manufacturer for this medication. She says that they have canceled this script before because of them not having the manufacturer. Pt is calling to tell Dr. Gwenlyn Found to not worry with changing this script for the mail order company, she states she will just go through her local pharmacy and pay more for the med.

## 2023-01-07 ENCOUNTER — Ambulatory Visit: Payer: Medicare Other | Attending: Cardiovascular Disease | Admitting: Cardiovascular Disease

## 2023-01-07 ENCOUNTER — Encounter: Payer: Self-pay | Admitting: Cardiovascular Disease

## 2023-01-07 VITALS — BP 122/80 | HR 67 | Ht 61.5 in | Wt 230.0 lb

## 2023-01-07 DIAGNOSIS — I480 Paroxysmal atrial fibrillation: Secondary | ICD-10-CM

## 2023-01-07 DIAGNOSIS — E782 Mixed hyperlipidemia: Secondary | ICD-10-CM | POA: Diagnosis not present

## 2023-01-07 DIAGNOSIS — G4733 Obstructive sleep apnea (adult) (pediatric): Secondary | ICD-10-CM

## 2023-01-07 MED ORDER — BISOPROLOL FUMARATE 5 MG PO TABS: ORAL_TABLET | ORAL | 0 refills | Status: DC

## 2023-01-07 NOTE — Assessment & Plan Note (Signed)
History of hyperlipidemia not on statin therapy or Zetia at her request lipid profile performed 08/09/2020 revealing total cholesterol 205, LDL 134 and HDL of 38.

## 2023-01-07 NOTE — Telephone Encounter (Signed)
Patient only wants the [employer] manufacturer of bisoprolol and her mail order pharmacy does not carry it. Advised she can check around local pharmacies and use Goodrx as needed to bypass her insurance requirements.

## 2023-01-07 NOTE — Patient Instructions (Signed)
Medication Instructions:  Your physician recommends that you continue on your current medications as directed. Please refer to the Current Medication list given to you today.  *If you need a refill on your cardiac medications before your next appointment, please call your pharmacy*   Lab Work: Your physician recommends that you return for lab work in: the next week or 2 for FASTING Lipids, CMET, & CBC   If you have labs (blood work) drawn today and your tests are completely normal, you will receive your results only by: [place] (if you have MyChart) OR A paper copy in the mail If you have any lab test that is abnormal or we need to change your treatment, we will call you to review the results.   Follow-Up: At [facility], you and your health needs are our priority.  As part of our continuing mission to provide you with exceptional heart care, we have created designated Provider Care Teams.  These Care Teams include your primary Cardiologist (physician) and Advanced Practice Providers (APPs -  Physician Assistants and Nurse Practitioners) who all work together to provide you with the care you need, when you need it.  We recommend signing up for the patient portal called "MyChart".  Sign up information is provided on this After Visit Summary.  MyChart is used to connect with patients for Virtual Visits (Telemedicine).  Patients are able to view lab/test results, encounter notes, upcoming appointments, etc.  Non-urgent messages can be sent to your provider as well.   To learn more about what you can do with MyChart, go to [email or web address].    Your next appointment:   12 month(s)  Provider:   Quay Burow, MD

## 2023-01-07 NOTE — Assessment & Plan Note (Signed)
History of morbid obesity with BMI of 42.  I offered her referral to the [facility] but she does not feel that she is able to drive the distance.  We also talked about GLP-1 agonists and have asked her to discuss this with her primary care doctor.

## 2023-01-07 NOTE — Assessment & Plan Note (Signed)
History of obstructive sleep apnea on CPAP. 

## 2023-01-07 NOTE — Progress Notes (Unsigned)
01/07/2023 Shannon Griffin   21-Sep-1946  [id number]  Primary Physician Lin Landsman Angelique Blonder, MD Primary Cardiologist: Lorretta Harp MD Garret Reddish, [place], [place]  HPI:  Shannon Griffin is a 77 y.o.  moderately overweight widowed Caucasian female mother of one daughter, grandmother and one grandchild who I last saw 12/31/2021. She relocated from [place] to be closer to her family. Her history is remarkable for paroxysmal atrial fibrillation and mild hyperlipidemia as well as obesity and obstructive sleep apnea. She wears C Pap which she benefits from.. She denies chest pain or shortness of breath. She had a 2-D echocardiogram performed in the past that showed normal LV size and function with moderate to severe left atrial enlargement. Since I saw her a year ago she's remained stable. She does work in the yard. She is unable to lose weight however. She gets occasional episodes of palpitations which may be brief though infrequent episodes of PAF currently in sinus rhythm. The CHA2DSVASC2 score is 2  . We discussed the risks and benefits of oral anticoagulation with regard to stroke prophylaxis. She is currently on Eliquis oral anticoagulation. Since I saw her year ago she's remained asymptomatic specifically denying chest pain. She does have some mild dyspnea on exertion probably multifactorial related to obesity and deconditioning   She did have an episode of A. fib with RVR brought her to the emergency room on 12/05/2018.  She underwent DC cardioversion by Dr. Venora Maples successfully back to sinus rhythm.  She saw Roderic Palau, NP in the A. fib clinic several days later in sinus rhythm.  Otherwise, she gets some dyspnea on exertion.  She denies chest pain.  She continues to wear CPAP for her sleep apnea.  She is had little success with weight loss however.    She has occasional episodes of PAF with which resolve quickly and spontaneously.  She really has been unsuccessful with weight loss.  She does wear  her CPAP.  She denies chest pain or shortness of breath but does complain of daytime somnolence.  She had a lipid profile performed 10/11/2019 that revealed total cholesterol 207 with an LDL of 135.  She is resistant to starting statin therapy.  I referred her to Dr. Debara Pickett.  She was started on Zetia which was ineffective.  She went on a diet which did not affect her lipid profile.  PCSK9 was raised in her discussion with Dr. Debara Pickett which she was hesitant to pursue at that time but now is receptive.   Since I saw her in the office a year ago she continues to do well.  She has had several episodes of breakthrough PAF which were isolated and quickly worse spontaneously.  She remains on Eliquis oral anticoagulation.  She still complains of some dyspnea which I suspect is multifactorial.  She denies chest pain.     Current Meds  Medication Sig   apixaban (ELIQUIS) 5 MG TABS tablet Take 1 tablet (5 mg total) by mouth 2 (two) times daily.   bisoprolol (ZEBETA) 5 MG tablet TAKE 1/2 TABLET EVERY MORNING AND 1 TABLET EVERY EVENING   diltiazem (CARDIZEM) 30 MG tablet Take 1 tablet every 4 hours AS NEEDED for heart rate >100   ELDERBERRY PO Take 1 tablet by mouth daily.   levothyroxine (SYNTHROID, LEVOTHROID) 112 MCG tablet Take 112 mcg by mouth daily before breakfast. Take 112 mcg by mouth 6 days a week and 56 mcg on the 7th day   Multiple Vitamins-Minerals (MULTIVITAMIN  WITH MINERALS) tablet Take 1 tablet by mouth daily.   VALERIAN PO Take 2 tablets by mouth 4 (four) times daily -  with meals and at bedtime.   vitamin C (ASCORBIC ACID) 500 MG tablet Take 500 mg by mouth daily.   vitamin E 400 UNIT capsule Take 400 Units by mouth daily.     Allergies  Allergen Reactions   Freeze It [Camphor-Menthol] Swelling    Histofreeze   Lidocaine Swelling    Social History   Socioeconomic History   Marital status: Widowed    Spouse name: Not on file   Number of children: Not on file   Years of education: Not  on file   Highest education level: Not on file  Occupational History   Not on file  Tobacco Use   Smoking status: Never   Smokeless tobacco: Never  Substance and Sexual Activity   Alcohol use: No   Drug use: No   Sexual activity: Never  Other Topics Concern   Not on file  Social History Narrative   Not on file   Social Determinants of Health   Financial Resource Strain: Not on file  Food Insecurity: Not on file  Transportation Needs: Not on file  Physical Activity: Not on file  Stress: Not on file  Social Connections: Not on file  Intimate Partner Violence: Not on file     Review of Systems: General: negative for chills, fever, night sweats or weight changes.  Cardiovascular: negative for chest pain, dyspnea on exertion, edema, orthopnea, palpitations, paroxysmal nocturnal dyspnea or shortness of breath Dermatological: negative for rash Respiratory: negative for cough or wheezing Urologic: negative for hematuria Abdominal: negative for nausea, vomiting, diarrhea, bright red blood per rectum, melena, or hematemesis Neurologic: negative for visual changes, syncope, or dizziness All other systems reviewed and are otherwise negative except as noted above.    Blood pressure 122/80, pulse 67, height 5' 1.5" (1.562 m), weight 230 lb (104.3 kg).  General appearance: alert and no distress Neck: no adenopathy, no carotid bruit, no JVD, supple, symmetrical, trachea midline, and thyroid not enlarged, symmetric, no tenderness/mass/nodules Lungs: clear to auscultation bilaterally Heart: regular rate and rhythm, S1, S2 normal, no murmur, click, rub or gallop Extremities: extremities normal, atraumatic, no cyanosis or edema Pulses: 2+ and symmetric Skin: Skin color, texture, turgor normal. No rashes or lesions Neurologic: Grossly normal  EKG sinus rhythm at 67 with occasional PACs.  I personally reviewed this EKG.  ASSESSMENT AND PLAN:   Paroxysmal atrial fibrillation  (HCC) History of PAF maintaining sinus rhythm on Eliquis oral anticoagulation and as needed diltiazem.  She has infrequent episodes of breakthrough which quickly resolved spontaneously.  Hyperlipidemia History of hyperlipidemia not on statin therapy or Zetia at her request lipid profile performed 08/09/2020 revealing total cholesterol 205, LDL 134 and HDL of 38.  Obstructive sleep apnea History of obstructive sleep apnea on CPAP  Morbid obesity (HCC) History of morbid obesity with BMI of 42.  I offered her referral to the [facility] but she does not feel that she is able to drive the distance.  We also talked about GLP-1 agonists and have asked her to discuss this with her primary care doctor.     Lorretta Harp MD FACP,FACC,FAHA, [facility] 01/07/2023 12:15 PM

## 2023-01-07 NOTE — Addendum Note (Signed)
Addended by: Rollen Sox on: 01/07/2023 01:08 PM   Modules accepted: Orders

## 2023-01-07 NOTE — Assessment & Plan Note (Signed)
History of PAF maintaining sinus rhythm on Eliquis oral anticoagulation and as needed diltiazem.  She has infrequent episodes of breakthrough which quickly resolved spontaneously.

## 2023-01-12 DIAGNOSIS — I480 Paroxysmal atrial fibrillation: Secondary | ICD-10-CM | POA: Diagnosis not present

## 2023-01-12 DIAGNOSIS — E782 Mixed hyperlipidemia: Secondary | ICD-10-CM | POA: Diagnosis not present

## 2023-01-12 DIAGNOSIS — G4733 Obstructive sleep apnea (adult) (pediatric): Secondary | ICD-10-CM | POA: Diagnosis not present

## 2023-01-12 LAB — LIPID PANEL

## 2023-01-13 ENCOUNTER — Other Ambulatory Visit: Payer: Self-pay

## 2023-01-13 DIAGNOSIS — E782 Mixed hyperlipidemia: Secondary | ICD-10-CM

## 2023-01-13 LAB — COMPREHENSIVE METABOLIC PANEL
ALT: 13 IU/L (ref 0–32)
AST: 15 IU/L (ref 0–40)
Albumin/Globulin Ratio: 1.6 (ref 1.2–2.2)
Albumin: 3.9 g/dL (ref 3.8–4.8)
Alkaline Phosphatase: 70 IU/L (ref 44–121)
BUN/Creatinine Ratio: 16 (ref 12–28)
BUN: 14 mg/dL (ref 8–27)
Bilirubin Total: 0.4 mg/dL (ref 0.0–1.2)
CO2: 24 mmol/L (ref 20–29)
Calcium: 9.6 mg/dL (ref 8.7–10.3)
Chloride: 105 mmol/L (ref 96–106)
Creatinine, Ser: 0.87 mg/dL (ref 0.57–1.00)
Globulin, Total: 2.4 g/dL (ref 1.5–4.5)
Glucose: 92 mg/dL (ref 70–99)
Potassium: 4.9 mmol/L (ref 3.5–5.2)
Sodium: 143 mmol/L (ref 134–144)
Total Protein: 6.3 g/dL (ref 6.0–8.5)
eGFR: 69 mL/min/{1.73_m2} (ref >59)

## 2023-01-13 LAB — LIPID PANEL
Chol/HDL Ratio: 4.9 ratio — ABNORMAL HIGH (ref 0.0–4.4)
Cholesterol, Total: 240 mg/dL — ABNORMAL HIGH (ref 100–199)
HDL: 49 mg/dL (ref >39)
LDL Chol Calc (NIH): 165 mg/dL — ABNORMAL HIGH (ref 0–99)
Triglycerides: 141 mg/dL (ref 0–149)
VLDL Cholesterol Cal: 26 mg/dL (ref 5–40)

## 2023-01-13 LAB — CBC
Hematocrit: 43.2 % (ref 34.0–46.6)
Hemoglobin: 14.1 g/dL (ref 11.1–15.9)
MCH: 28.7 pg (ref 26.6–33.0)
MCHC: 32.6 g/dL (ref 31.5–35.7)
MCV: 88 fL (ref 79–97)
Platelets: 181 10*3/uL (ref 150–450)
RBC: 4.91 x10E6/uL (ref 3.77–5.28)
RDW: 13.6 % (ref 11.7–15.4)
WBC: 7.2 10*3/uL (ref 3.4–10.8)

## 2023-01-14 ENCOUNTER — Ambulatory Visit: Payer: Medicare Other | Attending: Internal Medicine | Admitting: Student

## 2023-01-14 ENCOUNTER — Encounter: Payer: Self-pay | Admitting: Student

## 2023-01-14 DIAGNOSIS — E782 Mixed hyperlipidemia: Secondary | ICD-10-CM | POA: Insufficient documentation

## 2023-01-14 MED ORDER — ATORVASTATIN CALCIUM 40 MG PO TABS: 40.0000 mg | ORAL_TABLET | Freq: Every day | ORAL | 11 refills | Status: DC

## 2023-01-14 MED ORDER — BISOPROLOL FUMARATE 5 MG PO TABS: ORAL_TABLET | ORAL | 3 refills | Status: DC

## 2023-01-14 NOTE — Progress Notes (Signed)
Patient ID: [name]                 DOB: 06-08-1946                    MRN: [id number]      HPI: Shannon Griffin is a 77 y.o. female patient referred to lipid clinic by [facility]. PMH is significant for PAF(paroxysmal atrial fibrillation), OSA, morbid obesity elevated CAC score   Patient presented today for lipid clinic. Reports her brother died few years back and his autopsy has some negative findings related to Crestor use. So she did not wanted to try any statins. She had consulted with Dr. Debara Pickett in the past and discussed the cholesterol medications options multiple time. Now after careful thinking she is willing to try statins but not Crestor. She had made major dietary changes to cut down on the fat intake still her cholesterol is high. She does not do any regular exercises due to chronic back and knee pain. In the past she was prescribed Zetia 10 mg but she had stopped taking it due to high co-pay. At the last visit with Dr. Gwenlyn Found she was given paper prescription for bisoprolol to get specific brand. The specific brand bisoprolol is cost prohibitive for her so she wanted to go back to generic version but her mail order pharmacy does not accept paper prescription so she wanted [place] to send bisoprolol ( generic version) prescription to her mail order pharmacy.      Diet:  Low fat diet - lots of vegetables and fruits   Exercise: none - bad back and knee   Family History:   mother- atrial fibrillation, coronary artery disease (CAD) minis strokes  Brother- atrial fibrillation   Social History:  Smoking: never  Alcohol: none   Labs: Lipid Panel     Component Value Date/Time   CHOL 240 (H) 01/12/2023 0843   TRIG 141 01/12/2023 0843   HDL 49 01/12/2023 0843   CHOLHDL 4.9 (H) 01/12/2023 0843   LDLCALC 165 (H) 01/12/2023 0843   LABVLDL 26 01/12/2023 0843    Past Medical History:  Diagnosis Date   Angio-edema    Atrial fibrillation (Interior) 06/27/2011   Echo - EF 55-60%, LV wall  thickness mildly increased; mod/severe LA dilation; estimated pulm arterial systolic pressure elevated at 30-85mmHg   Heart murmur    Hyperlipidemia    Hypothyroid    OSA on CPAP 10/25/2012   ahi 1.5   PONV (postoperative nausea and vomiting)    Recurrent upper respiratory infection (URI)     Current Outpatient Medications on File Prior to Visit  Medication Sig Dispense Refill   apixaban (ELIQUIS) 5 MG TABS tablet Take 1 tablet (5 mg total) by mouth 2 (two) times daily. 60 tablet 1   diltiazem (CARDIZEM) 30 MG tablet Take 1 tablet every 4 hours AS NEEDED for heart rate >100 30 tablet 1   ELDERBERRY PO Take 1 tablet by mouth daily.     levothyroxine (SYNTHROID, LEVOTHROID) 112 MCG tablet Take 112 mcg by mouth daily before breakfast. Take 112 mcg by mouth 6 days a week and 56 mcg on the 7th day     Multiple Vitamins-Minerals (MULTIVITAMIN WITH MINERALS) tablet Take 1 tablet by mouth daily.     VALERIAN PO Take 2 tablets by mouth 4 (four) times daily -  with meals and at bedtime.     vitamin C (ASCORBIC ACID) 500 MG tablet Take 500 mg by mouth daily.  vitamin E 400 UNIT capsule Take 400 Units by mouth daily.     No current facility-administered medications on file prior to visit.    Allergies  Allergen Reactions   Freeze It [Camphor-Menthol] Swelling    Histofreeze   Lidocaine Swelling    Assessment/Plan:  1. Hyperlipidemia -  Problem  Hyperlipidemia   hyperlipidemia    Hyperlipidemia Assessment:  LDL goal: <70 mg/dl last LDLc 165 mg/dl (01/12/2023) Never tried any statins and Zetia was cost prohibitive  Willing to try any statins except Crestor  Plan: Start taking atorvastatin 40 mg daily if not tolerated well take 20 mg daily if not tolerated well reduced to 10 mg daily or even 10 mg every other day  Will follow up in 1 month via  phone or MyChart on tolerability   At 1 moth follow up consider switching to other statin if not tolerated well or add Zetia 10 mg to max  tolerated statins   Lab(s): baseline LFT today and repeat lipid and LFT in 3 months if stays on statins and/or Zetia     Thank you,  Cammy Copa, Pharm.D [facility] [address], [place], [place] [address]  Phone: [phone]; Fax: [phone]

## 2023-01-14 NOTE — Patient Instructions (Addendum)
Changes made by your pharmacist Cammy Copa, PharmD at today's visit:    Instructions/Changes  (what do you need to do) Your Notes  (what you did and when you did it)  Start taking atorvastatin 40 mg daily if not tolerated well take 20 mg daily if not tolerated well reduced to 10 mg daily or even 10 mg every other day       If you have any questions or concerns please use My Chart to send questions or call the office at [phone]

## 2023-01-14 NOTE — Assessment & Plan Note (Addendum)
Assessment:  LDL goal: <70 mg/dl last LDLc 165 mg/dl (01/12/2023) Never tried any statins and Zetia was cost prohibitive  Willing to try any statins except Crestor  Plan: Start taking atorvastatin 40 mg daily if not tolerated well take 20 mg daily if not tolerated well reduced to 10 mg daily or even 10 mg every other day  Will follow up in 1 month via  phone or MyChart on tolerability   At 1 moth follow up consider switching to other statin if not tolerated well or add Zetia 10 mg to max tolerated statins   Lab(s): baseline LFT today and repeat lipid and LFT in 3 months if stays on statins and/or Zetia

## 2023-01-15 ENCOUNTER — Telehealth: Payer: Self-pay | Admitting: Cardiovascular Disease

## 2023-01-15 LAB — HEPATIC FUNCTION PANEL
ALT: 15 IU/L (ref 0–32)
AST: 17 IU/L (ref 0–40)
Albumin: 4.2 g/dL (ref 3.8–4.8)
Alkaline Phosphatase: 79 IU/L (ref 44–121)
Bilirubin Total: 0.4 mg/dL (ref 0.0–1.2)
Bilirubin, Direct: 0.11 mg/dL (ref 0.00–0.40)
Total Protein: 6.8 g/dL (ref 6.0–8.5)

## 2023-01-15 MED ORDER — DILTIAZEM HCL 30 MG PO TABS: ORAL_TABLET | ORAL | 1 refills | Status: DC

## 2023-01-15 NOTE — Telephone Encounter (Signed)
Refill for Diltiazem has been sent to [facility].

## 2023-01-15 NOTE — Telephone Encounter (Signed)
*  STAT* If patient is at the pharmacy, call can be transferred to refill team.   1. Which medications need to be refilled? (please list name of each medication and dose if known)   diltiazem (CARDIZEM) 30 MG tablet    2. Which pharmacy/location (including street and city if local pharmacy) is medication to be sent to?  [place], [place]    3. Do they need a 30 day or 90 day supply?  30 day

## 2023-01-28 ENCOUNTER — Encounter: Payer: Self-pay | Admitting: Cardiovascular Disease

## 2023-02-04 DIAGNOSIS — M9903 Segmental and somatic dysfunction of lumbar region: Secondary | ICD-10-CM | POA: Diagnosis not present

## 2023-02-04 DIAGNOSIS — M5136 Other intervertebral disc degeneration, lumbar region: Secondary | ICD-10-CM | POA: Diagnosis not present

## 2023-02-04 DIAGNOSIS — M5137 Other intervertebral disc degeneration, lumbosacral region: Secondary | ICD-10-CM | POA: Diagnosis not present

## 2023-02-04 DIAGNOSIS — M9904 Segmental and somatic dysfunction of sacral region: Secondary | ICD-10-CM | POA: Diagnosis not present

## 2023-02-04 DIAGNOSIS — M62830 Muscle spasm of back: Secondary | ICD-10-CM | POA: Diagnosis not present

## 2023-02-04 DIAGNOSIS — M5442 Lumbago with sciatica, left side: Secondary | ICD-10-CM | POA: Diagnosis not present

## 2023-02-11 NOTE — Telephone Encounter (Signed)
Spoke to patient. Patient does not wanted to go on any lipid lowering medication. She is aware of risk of elevated LDLc and lipid lowering agents risk of side effects overweigh benefits in her opinion. She easily reacts to the medications.   Will reach out to [place] if she change her mind in future.

## 2023-02-18 DIAGNOSIS — M5442 Lumbago with sciatica, left side: Secondary | ICD-10-CM | POA: Diagnosis not present

## 2023-02-18 DIAGNOSIS — M62830 Muscle spasm of back: Secondary | ICD-10-CM | POA: Diagnosis not present

## 2023-02-18 DIAGNOSIS — M9903 Segmental and somatic dysfunction of lumbar region: Secondary | ICD-10-CM | POA: Diagnosis not present

## 2023-02-18 DIAGNOSIS — M5136 Other intervertebral disc degeneration, lumbar region: Secondary | ICD-10-CM | POA: Diagnosis not present

## 2023-02-18 DIAGNOSIS — M9904 Segmental and somatic dysfunction of sacral region: Secondary | ICD-10-CM | POA: Diagnosis not present

## 2023-02-18 DIAGNOSIS — M5137 Other intervertebral disc degeneration, lumbosacral region: Secondary | ICD-10-CM | POA: Diagnosis not present

## 2023-02-24 DIAGNOSIS — E038 Other specified hypothyroidism: Secondary | ICD-10-CM | POA: Diagnosis not present

## 2023-02-24 DIAGNOSIS — E063 Autoimmune thyroiditis: Secondary | ICD-10-CM | POA: Diagnosis not present

## 2023-03-04 DIAGNOSIS — M62830 Muscle spasm of back: Secondary | ICD-10-CM | POA: Diagnosis not present

## 2023-03-04 DIAGNOSIS — M9904 Segmental and somatic dysfunction of sacral region: Secondary | ICD-10-CM | POA: Diagnosis not present

## 2023-03-04 DIAGNOSIS — M5136 Other intervertebral disc degeneration, lumbar region: Secondary | ICD-10-CM | POA: Diagnosis not present

## 2023-03-04 DIAGNOSIS — M5137 Other intervertebral disc degeneration, lumbosacral region: Secondary | ICD-10-CM | POA: Diagnosis not present

## 2023-03-04 DIAGNOSIS — M9903 Segmental and somatic dysfunction of lumbar region: Secondary | ICD-10-CM | POA: Diagnosis not present

## 2023-03-04 DIAGNOSIS — M5442 Lumbago with sciatica, left side: Secondary | ICD-10-CM | POA: Diagnosis not present

## 2023-03-18 DIAGNOSIS — M9903 Segmental and somatic dysfunction of lumbar region: Secondary | ICD-10-CM | POA: Diagnosis not present

## 2023-03-18 DIAGNOSIS — M5136 Other intervertebral disc degeneration, lumbar region: Secondary | ICD-10-CM | POA: Diagnosis not present

## 2023-03-18 DIAGNOSIS — M5137 Other intervertebral disc degeneration, lumbosacral region: Secondary | ICD-10-CM | POA: Diagnosis not present

## 2023-03-18 DIAGNOSIS — M62830 Muscle spasm of back: Secondary | ICD-10-CM | POA: Diagnosis not present

## 2023-03-18 DIAGNOSIS — M9904 Segmental and somatic dysfunction of sacral region: Secondary | ICD-10-CM | POA: Diagnosis not present

## 2023-03-18 DIAGNOSIS — M5442 Lumbago with sciatica, left side: Secondary | ICD-10-CM | POA: Diagnosis not present

## 2023-03-23 ENCOUNTER — Telehealth: Payer: Self-pay | Admitting: Cardiovascular Disease

## 2023-03-23 DIAGNOSIS — I480 Paroxysmal atrial fibrillation: Secondary | ICD-10-CM

## 2023-03-23 MED ORDER — APIXABAN 5 MG PO TABS: 5.0000 mg | ORAL_TABLET | Freq: Two times a day (BID) | ORAL | 0 refills | Status: DC

## 2023-03-23 NOTE — Telephone Encounter (Signed)
*  STAT* If patient is at the pharmacy, call can be transferred to refill team.   1. Which medications need to be refilled? (please list name of each medication and dose if known) apixaban (ELIQUIS) 5 MG TABS tablet  2. Which pharmacy/location (including street and city if local pharmacy) is medication to be sent to? [facility] [place], [place] - [address]   3. Do they need a 30 day or 90 day supply?  90 day supply

## 2023-03-23 NOTE — Telephone Encounter (Signed)
Prescription refill request for Eliquis received. Indication: a fib Last office visit: 01/07/23 Scr: 0.87 01/12/23 epic Age: 77 Weight: 104kg

## 2023-03-30 DIAGNOSIS — M1990 Unspecified osteoarthritis, unspecified site: Secondary | ICD-10-CM | POA: Diagnosis not present

## 2023-03-30 DIAGNOSIS — E039 Hypothyroidism, unspecified: Secondary | ICD-10-CM | POA: Diagnosis not present

## 2023-03-30 DIAGNOSIS — I4891 Unspecified atrial fibrillation: Secondary | ICD-10-CM | POA: Diagnosis not present

## 2023-04-01 DIAGNOSIS — M5442 Lumbago with sciatica, left side: Secondary | ICD-10-CM | POA: Diagnosis not present

## 2023-04-01 DIAGNOSIS — M9903 Segmental and somatic dysfunction of lumbar region: Secondary | ICD-10-CM | POA: Diagnosis not present

## 2023-04-01 DIAGNOSIS — M5137 Other intervertebral disc degeneration, lumbosacral region: Secondary | ICD-10-CM | POA: Diagnosis not present

## 2023-04-01 DIAGNOSIS — M9904 Segmental and somatic dysfunction of sacral region: Secondary | ICD-10-CM | POA: Diagnosis not present

## 2023-04-01 DIAGNOSIS — M5136 Other intervertebral disc degeneration, lumbar region: Secondary | ICD-10-CM | POA: Diagnosis not present

## 2023-04-01 DIAGNOSIS — M62830 Muscle spasm of back: Secondary | ICD-10-CM | POA: Diagnosis not present

## 2023-04-15 DIAGNOSIS — M62830 Muscle spasm of back: Secondary | ICD-10-CM | POA: Diagnosis not present

## 2023-04-15 DIAGNOSIS — M5136 Other intervertebral disc degeneration, lumbar region: Secondary | ICD-10-CM | POA: Diagnosis not present

## 2023-04-15 DIAGNOSIS — M5442 Lumbago with sciatica, left side: Secondary | ICD-10-CM | POA: Diagnosis not present

## 2023-04-15 DIAGNOSIS — M5137 Other intervertebral disc degeneration, lumbosacral region: Secondary | ICD-10-CM | POA: Diagnosis not present

## 2023-04-15 DIAGNOSIS — M9904 Segmental and somatic dysfunction of sacral region: Secondary | ICD-10-CM | POA: Diagnosis not present

## 2023-04-15 DIAGNOSIS — M9903 Segmental and somatic dysfunction of lumbar region: Secondary | ICD-10-CM | POA: Diagnosis not present

## 2023-04-29 DIAGNOSIS — M62830 Muscle spasm of back: Secondary | ICD-10-CM | POA: Diagnosis not present

## 2023-04-29 DIAGNOSIS — M5136 Other intervertebral disc degeneration, lumbar region: Secondary | ICD-10-CM | POA: Diagnosis not present

## 2023-04-29 DIAGNOSIS — M5442 Lumbago with sciatica, left side: Secondary | ICD-10-CM | POA: Diagnosis not present

## 2023-04-29 DIAGNOSIS — M5137 Other intervertebral disc degeneration, lumbosacral region: Secondary | ICD-10-CM | POA: Diagnosis not present

## 2023-04-29 DIAGNOSIS — M9903 Segmental and somatic dysfunction of lumbar region: Secondary | ICD-10-CM | POA: Diagnosis not present

## 2023-04-29 DIAGNOSIS — M9904 Segmental and somatic dysfunction of sacral region: Secondary | ICD-10-CM | POA: Diagnosis not present

## 2023-05-13 DIAGNOSIS — M9903 Segmental and somatic dysfunction of lumbar region: Secondary | ICD-10-CM | POA: Diagnosis not present

## 2023-05-13 DIAGNOSIS — M9904 Segmental and somatic dysfunction of sacral region: Secondary | ICD-10-CM | POA: Diagnosis not present

## 2023-05-13 DIAGNOSIS — M5137 Other intervertebral disc degeneration, lumbosacral region: Secondary | ICD-10-CM | POA: Diagnosis not present

## 2023-05-13 DIAGNOSIS — M5442 Lumbago with sciatica, left side: Secondary | ICD-10-CM | POA: Diagnosis not present

## 2023-05-13 DIAGNOSIS — M62830 Muscle spasm of back: Secondary | ICD-10-CM | POA: Diagnosis not present

## 2023-05-13 DIAGNOSIS — M5136 Other intervertebral disc degeneration, lumbar region: Secondary | ICD-10-CM | POA: Diagnosis not present

## 2023-05-27 DIAGNOSIS — M62830 Muscle spasm of back: Secondary | ICD-10-CM | POA: Diagnosis not present

## 2023-05-27 DIAGNOSIS — M9904 Segmental and somatic dysfunction of sacral region: Secondary | ICD-10-CM | POA: Diagnosis not present

## 2023-05-27 DIAGNOSIS — M9903 Segmental and somatic dysfunction of lumbar region: Secondary | ICD-10-CM | POA: Diagnosis not present

## 2023-05-27 DIAGNOSIS — M5136 Other intervertebral disc degeneration, lumbar region: Secondary | ICD-10-CM | POA: Diagnosis not present

## 2023-05-27 DIAGNOSIS — M5137 Other intervertebral disc degeneration, lumbosacral region: Secondary | ICD-10-CM | POA: Diagnosis not present

## 2023-05-27 DIAGNOSIS — M5442 Lumbago with sciatica, left side: Secondary | ICD-10-CM | POA: Diagnosis not present

## 2023-06-10 DIAGNOSIS — M9903 Segmental and somatic dysfunction of lumbar region: Secondary | ICD-10-CM | POA: Diagnosis not present

## 2023-06-10 DIAGNOSIS — M9904 Segmental and somatic dysfunction of sacral region: Secondary | ICD-10-CM | POA: Diagnosis not present

## 2023-06-10 DIAGNOSIS — M5136 Other intervertebral disc degeneration, lumbar region: Secondary | ICD-10-CM | POA: Diagnosis not present

## 2023-06-10 DIAGNOSIS — M5137 Other intervertebral disc degeneration, lumbosacral region: Secondary | ICD-10-CM | POA: Diagnosis not present

## 2023-06-10 DIAGNOSIS — M5442 Lumbago with sciatica, left side: Secondary | ICD-10-CM | POA: Diagnosis not present

## 2023-06-10 DIAGNOSIS — M62830 Muscle spasm of back: Secondary | ICD-10-CM | POA: Diagnosis not present

## 2023-06-24 DIAGNOSIS — M5136 Other intervertebral disc degeneration, lumbar region: Secondary | ICD-10-CM | POA: Diagnosis not present

## 2023-06-24 DIAGNOSIS — M5442 Lumbago with sciatica, left side: Secondary | ICD-10-CM | POA: Diagnosis not present

## 2023-06-24 DIAGNOSIS — M62830 Muscle spasm of back: Secondary | ICD-10-CM | POA: Diagnosis not present

## 2023-06-24 DIAGNOSIS — M5137 Other intervertebral disc degeneration, lumbosacral region: Secondary | ICD-10-CM | POA: Diagnosis not present

## 2023-06-24 DIAGNOSIS — M9904 Segmental and somatic dysfunction of sacral region: Secondary | ICD-10-CM | POA: Diagnosis not present

## 2023-06-24 DIAGNOSIS — M9903 Segmental and somatic dysfunction of lumbar region: Secondary | ICD-10-CM | POA: Diagnosis not present

## 2023-06-30 ENCOUNTER — Telehealth: Payer: Self-pay | Admitting: Cardiovascular Disease

## 2023-06-30 DIAGNOSIS — I480 Paroxysmal atrial fibrillation: Secondary | ICD-10-CM

## 2023-06-30 MED ORDER — APIXABAN 5 MG PO TABS: 5.0000 mg | ORAL_TABLET | Freq: Two times a day (BID) | ORAL | 1 refills | Status: DC

## 2023-06-30 NOTE — Telephone Encounter (Signed)
*  STAT* If patient is at the pharmacy, call can be transferred to refill team.   1. Which medications need to be refilled? (please list name of each medication and dose if known)  apixaban (ELIQUIS) 5 MG TABS tablet   2. Which pharmacy/location (including street and city if local pharmacy) is medication to be sent to?  [facility] [place], [place] - [address]   3. Do they need a 30 day or 90 day supply? 90

## 2023-06-30 NOTE — Telephone Encounter (Signed)
Prescription refill request for Eliquis received. Indication: PAF Last office visit: 01/07/23  Erlene Quan MD Scr: 0.87 on 01/12/23  Epic Age: 77 Weight: 104.3kg  Based on above findings Eliquis 5mg  twice daily is the appropriate dose.  Refill approved.

## 2023-08-06 DIAGNOSIS — H26492 Other secondary cataract, left eye: Secondary | ICD-10-CM | POA: Diagnosis not present

## 2023-11-18 ENCOUNTER — Other Ambulatory Visit: Payer: Self-pay | Admitting: *Deleted

## 2023-11-18 DIAGNOSIS — I480 Paroxysmal atrial fibrillation: Secondary | ICD-10-CM

## 2023-11-18 MED ORDER — APIXABAN 5 MG PO TABS: 5.0000 mg | ORAL_TABLET | Freq: Two times a day (BID) | ORAL | 1 refills | Status: DC

## 2023-11-18 NOTE — Telephone Encounter (Signed)
Eliquis 5mg  refill request received. Patient is 78 years old, weight-104.3kg, Crea-0.87 on 01/12/23, Diagnosis-Afib, and last seen by Dr. Allyson Sabal on 01/07/23. Dose is appropriate based on dosing criteria. Will send in refill to requested pharmacy.

## 2023-12-01 ENCOUNTER — Other Ambulatory Visit: Payer: Self-pay

## 2023-12-01 MED ORDER — BISOPROLOL FUMARATE 5 MG PO TABS: ORAL_TABLET | ORAL | 0 refills | Status: DC

## 2023-12-15 ENCOUNTER — Telehealth: Payer: Self-pay | Admitting: Cardiovascular Disease

## 2023-12-15 NOTE — Telephone Encounter (Signed)
 Spoke to patient. She states she has been having shortness of breath for several days. She was awken yesterday trying to catch her breathe from a nap. She was sitting up   No chest pain  No edema  Having sinus issues but not unusual No recent  colds, or flu.   History of Afib , she does not know if she is in Afib now.  She states she did not contact primary , she wanted to see cardiology   Appt schedule for 12/18/23 at 2:45 pm. Patient aware to go ER if symptoms become worse.

## 2023-12-15 NOTE — Telephone Encounter (Signed)
 Pt c/o Shortness Of Breath: STAT if SOB developed within the last 24 hours or pt is noticeably SOB on the phone  1. Are you currently SOB (can you hear that pt is SOB on the phone)?   No  2. How long have you been experiencing SOB?   A few days ago  3. Are you SOB when sitting or when up moving around?   When sitting but more so when up and moving  4. Are you currently experiencing any other symptoms?  Very tired, headache, post nasal drip  Patient is concerned about her shortness of breath.  Patient stated can leave voice message as she has a block on her phone.

## 2023-12-16 NOTE — Progress Notes (Unsigned)
 Cardiology Office Note    Date:  12/18/2023  ID:  Shannon Griffin, DOB June 13, 1946, MRN [id number] PCP:  Noni Saupe, MD  Cardiologist:  Nanetta Batty, MD  Electrophysiologist:  None   Chief Complaint: Shortness of breath   History of Present Illness: .    Shannon Griffin is a 78 y.o. female with visit-pertinent history of paroxysmal atrial fibrillation, OSA, hyperlipidemia, hypothyroidism.  Patient was initially diagnosed with atrial fibrillation in 2012 and had a DCCV in 2015.  In 11/2018 she had recurrent episode of atrial fibrillation with RVR and was seen in the ED are, she underwent DCCV and was converted to sinus rhythm.  On chart review it appears that patient has had intermittent palpitations felt to possibly be atrial fibrillation.  In 05/2021 she was found to be in atrial fibrillation and DCCV was scheduled however on arrival she was found to be back in sinus rhythm.  Patient was last seen by Dr. Allyson Sabal on 01/07/2023, patient reported several episodes of breakthrough atrial fibrillation which were isolated.   Today patient presents for an acute visit for increased shortness of breath for the prior few days, she noted that she awoke yesterday trying to catch her breath while napping.  Patient was unsure if she was in atrial fibrillation. She denies chest pain, dizziness, lightheadedness, orthopnea or pnd. She denies lower extremity edema. Patient denies missing any doses of Eliquis.  She reports aside from increased shortness of breath while walking she is overall doing well.  She does have 2 slipped disc in her back that had been causing her increased pain recently.  She denies any increased caffeine intake and reports she is compliant with her CPAP.  ROS: .   Today she denies chest pain, lower extremity edema, fatigue, palpitations, melena, hematuria, hemoptysis, diaphoresis, weakness, presyncope, syncope, orthopnea, and PND.  All other systems are reviewed and otherwise  negative. Studies Reviewed: Marland Kitchen    EKG:  EKG is ordered today, personally reviewed, demonstrating  EKG Interpretation Date/Time:  Friday December 18 2023 15:39:34 EST Ventricular Rate:  135 PR Interval:    QRS Duration:  66 QT Interval:  264 QTC Calculation: 396 R Axis:   31  Text Interpretation: Atrial fibrillation with rapid ventricular response Low voltage QRS Septal infarct , age undetermined Confirmed by Reather Littler [phone]) on 12/18/2023 5:41:28 PM    CV Studies:  Cardiac Studies & Procedures   ______________________________________________________________________________________________     ECHOCARDIOGRAM  ECHOCARDIOGRAM COMPLETE 03/29/2020  Narrative ECHOCARDIOGRAM REPORT    Patient Name:   Shannon Griffin Date of Exam: 03/29/2020 Medical Rec #:  [id number]      Height:       61.5 in Accession #:    [id number]     Weight:       240.0 lb Date of Birth:  12-09-1945      BSA:          2.053 m Patient Age:    74 years       BP:           108/66 mmHg Patient Gender: F              HR:           65 bpm. Exam Location:  [address]  Procedure: 2D Echo, 3D Echo, Cardiac Doppler, Color Doppler and Strain Analysis  Indications:    I48 Atrial fibrillation  History:        Patient has no prior history of Echocardiogram  examinations. Arrythmias:Atrial Fibrillation; Risk Factors:Sleep Apnea, Dyslipidemia and Morbid obesity.  Sonographer:    Garald Braver, RDCS Referring Phys: [phone] JONATHAN J BERRY  IMPRESSIONS   1. Left ventricular ejection fraction, by estimation, is 60 to 65%. The left ventricle has normal function. The left ventricle has no regional wall motion abnormalities. Left ventricular diastolic parameters were normal. The average left ventricular global longitudinal strain is -23.1 %. 2. Right ventricular systolic function is normal. The right ventricular size is normal. There is moderately elevated pulmonary artery systolic pressure. 3. Left atrial size was  mildly dilated. 4. The mitral valve is normal in structure. Trivial mitral valve regurgitation. No evidence of mitral stenosis. 5. The aortic valve is normal in structure. Aortic valve regurgitation is not visualized. No aortic stenosis is present. 6. Aortic dilatation noted. There is mild dilatation of the ascending aorta measuring 38 mm.  FINDINGS Left Ventricle: Left ventricular ejection fraction, by estimation, is 60 to 65%. The left ventricle has normal function. The left ventricle has no regional wall motion abnormalities. The average left ventricular global longitudinal strain is -23.1 %. The left ventricular internal cavity size was normal in size. There is no left ventricular hypertrophy. Left ventricular diastolic parameters were normal.  Right Ventricle: The right ventricular size is normal. No increase in right ventricular wall thickness. Right ventricular systolic function is normal. There is moderately elevated pulmonary artery systolic pressure. The tricuspid regurgitant velocity is 3.05 m/s, and with an assumed right atrial pressure of 8 mmHg, the estimated right ventricular systolic pressure is 45.2 mmHg.  Left Atrium: Left atrial size was mildly dilated.  Right Atrium: Right atrial size was normal in size.  Pericardium: There is no evidence of pericardial effusion.  Mitral Valve: The mitral valve is normal in structure. Trivial mitral valve regurgitation. No evidence of mitral valve stenosis.  Tricuspid Valve: The tricuspid valve is grossly normal. Tricuspid valve regurgitation is mild . No evidence of tricuspid stenosis.  Aortic Valve: The aortic valve is normal in structure. Aortic valve regurgitation is not visualized. No aortic stenosis is present.  Pulmonic Valve: The pulmonic valve was normal in structure. Pulmonic valve regurgitation is mild. No evidence of pulmonic stenosis.  Aorta: Aortic dilatation noted. There is mild dilatation of the ascending aorta measuring  38 mm.  IAS/Shunts: The atrial septum is grossly normal.   LEFT VENTRICLE PLAX 2D LVIDd:         5.30 cm  Diastology LVIDs:         3.70 cm  LV e' lateral:   8.27 cm/s LV PW:         1.00 cm  LV E/e' lateral: 9.3 LV IVS:        1.00 cm  LV e' medial:    6.02 cm/s LVOT diam:     2.10 cm  LV E/e' medial:  12.7 LV SV:         111 LV SV Index:   54       2D Longitudinal Strain LVOT Area:     3.46 cm 2D Strain GLS (A2C):   -25.1 % 2D Strain GLS (A3C):   -21.6 % 2D Strain GLS (A4C):   -22.6 % 2D Strain GLS Avg:     -23.1 %  3D Volume EF: 3D EF:        67 % LV EDV:       118 ml LV ESV:       39 ml LV SV:  79 ml  RIGHT VENTRICLE RV Basal diam:  2.70 cm RV S prime:     12.90 cm/s TAPSE (M-mode): 2.3 cm RVSP:           45.2 mmHg  LEFT ATRIUM              Index       RIGHT ATRIUM           Index LA diam:        4.90 cm  2.39 cm/m  RA Pressure: 8.00 mmHg LA Vol (A2C):   54.9 ml  26.74 ml/m RA Area:     13.60 cm LA Vol (A4C):   106.0 ml 51.62 ml/m RA Volume:   33.10 ml  16.12 ml/m LA Biplane Vol: 78.9 ml  38.43 ml/m AORTIC VALVE LVOT Vmax:   123.00 cm/s LVOT Vmean:  91.400 cm/s LVOT VTI:    0.320 m  AORTA Ao Root diam: 3.60 cm Ao Asc diam:  3.80 cm  MITRAL VALVE               TRICUSPID VALVE TR Peak grad:   37.2 mmHg TR Vmax:        305.00 cm/s MV E velocity: 76.70 cm/s  Estimated RAP:  8.00 mmHg MV A velocity: 51.80 cm/s  RVSP:           45.2 mmHg MV E/A ratio:  1.48 SHUNTS Systemic VTI:  0.32 m Systemic Diam: 2.10 cm  Kristeen Miss MD Electronically signed by Kristeen Miss MD Signature Date/Time: 03/29/2020/1:40:27 PM    Final      CT SCANS  CT CARDIAC SCORING (SELF PAY ONLY) 10/26/2019  Addendum 10/26/2019  1:39 PM ADDENDUM REPORT: 10/26/2019 13:37  CLINICAL DATA:  Risk stratification  EXAM: Coronary Calcium Score  TECHNIQUE: The patient was scanned on a [employer] scanner. Axial non-contrast 3 mm slices were carried out through the  heart. The data set was analyzed on a dedicated work station and scored using the Agatson method.  FINDINGS: Non-cardiac: Large hiatal hernia. See separate report from [facility].  Ascending Aorta: Normal caliber.  Mild aortic root atherosclerosis.  Pericardium: Normal.  Coronary arteries: Normal origins.  IMPRESSION: 1. Coronary calcium score of 141. This was 68th percentile for age and sex matched control.  2. Aortic root atherosclerosis.  Lennie Odor, MD   Electronically Signed By: Lennie Odor On: 10/26/2019 13:37  Narrative EXAM: OVER-READ INTERPRETATION  CT CHEST  The following report is an over-read performed by radiologist Dr. Charlett Nose of [facility] on 10/26/2019. This over-read does not include interpretation of cardiac or coronary anatomy or pathology. The coronary calcium score interpretation by the cardiologist is attached.  COMPARISON:  None.  FINDINGS: Vascular: Heart is normal size. Aorta is normal caliber. Calcifications within the aortic root and visualized descending thoracic aorta.  Mediastinum/Nodes: No adenopathy in the lower mediastinum or hila. Small hiatal hernia.  Lungs/Pleura: Linear scarring in the left base. No confluent opacities or effusions.  Upper Abdomen: Imaging into the upper abdomen shows no acute findings.  Musculoskeletal: Chest wall soft tissues are unremarkable. No acute bony abnormality.  IMPRESSION: No acute extra cardiac abnormality.  Small a are.  Aortic atherosclerosis.  Electronically Signed: By: Charlett Nose M.D. On: 10/26/2019 13:20     ______________________________________________________________________________________________        Current Reported Medications:.    Current Meds  Medication Sig   apixaban (ELIQUIS) 5 MG TABS tablet Take 1 tablet (5 mg total) by  mouth 2 (two) times daily.   diltiazem (CARDIZEM) 30 MG tablet Take 1 tablet every 4 hours AS NEEDED  for heart rate >100   ELDERBERRY PO Take 1 tablet by mouth daily.   levothyroxine (SYNTHROID, LEVOTHROID) 112 MCG tablet Take 112 mcg by mouth daily before breakfast. Take 112 mcg by mouth 6 days a week and 56 mcg on the 7th day   Multiple Vitamins-Minerals (MULTIVITAMIN WITH MINERALS) tablet Take 1 tablet by mouth daily.   VALERIAN PO Take 2 tablets by mouth 4 (four) times daily -  with meals and at bedtime.   vitamin C (ASCORBIC ACID) 500 MG tablet Take 500 mg by mouth daily.   vitamin E 400 UNIT capsule Take 400 Units by mouth daily.   [DISCONTINUED] bisoprolol (ZEBETA) 5 MG tablet Take 1/2 tablet in the  morning and take 1 tablet in the evening   [DISCONTINUED] bisoprolol (ZEBETA) 5 MG tablet Take 1 tablet (5 mg total) by mouth daily.   Physical Exam:    VS:  BP 128/82   Pulse (!) 112   Ht 5' 1.5" (1.562 m)   Wt 240 lb (108.9 kg)   SpO2 95%   BMI 44.61 kg/m    Wt Readings from Last 3 Encounters:  12/18/23 240 lb (108.9 kg)  01/07/23 230 lb (104.3 kg)  12/31/21 235 lb 3.2 oz (106.7 kg)    GEN: Well nourished, well developed in no acute distress NECK: No JVD; No carotid bruits CARDIAC: Irregular rate and rhythm, no murmurs, rubs, gallops RESPIRATORY:  Clear to auscultation without rales, wheezing or rhonchi  ABDOMEN: Soft, non-tender, non-distended EXTREMITIES:  No edema; No acute deformity   Asessement and Plan:.    Persistent atrial fibrillation/shortness of breath: Patient with DCCV in 2015 and in 2020.  Was scheduled for DCCV in 2022 however on arrival was found to back in sinus rhythm.  EKG today indicates atrial fibrillation with RVR at 135 bpm.  After allowing patient to sit for few minutes her heart rate decreased to 112 bpm.  Patient denies any feelings of palpitations or increased heart rate, notes that she becomes short of breath faster when walking.  She denies chest pain or lower extremity edema.  Discussed with Dr. Rennis Golden, DOD at [facility] office, he agrees with plan  for cardioversion and increasing bisoprolol to 5 mg twice daily. Reviewed with patient, she is agreeable to cardioversion, please see consent below.  Patient reports adherence with Eliquis, denies any missed dosages.  Check CBC, BMET and TSH.  Will increase patient's bisoprolol to 5 mg twice daily, she has diltiazem 30 mg as needed for heart rate sustained greater than 100 bpm. Reviewed ED precautions. Informed Consent   Shared Decision Making/Informed Consent The risks (stroke, cardiac arrhythmias rarely resulting in the need for a temporary or permanent pacemaker, skin irritation or burns and complications associated with conscious sedation including aspiration, arrhythmia, respiratory failure and death), benefits (restoration of normal sinus rhythm) and alternatives of a direct current cardioversion were explained in detail to Ms. Casimir and she agrees to proceed.      Hypertension: Blood pressure today 128/82.  Continue current antihypertensive regimen.  Hyperlipidemia: Last lipid profile on 01/12/2023 indicated total cholesterol 240, HDL 49, triglycerides 141 and LDL 165.  Patient has previously been referred to Dr. Rennis Golden and has been seen by Pharm.D. lipid clinic team.  She has refused cholesterol-lowering medications.  OSA: Patient reports CPAP compliance.   Disposition: F/u with Dr. Allyson Sabal on 3/18 as scheduled.  Signed, Rip Harbour, NP

## 2023-12-16 NOTE — H&P (View-Only) (Signed)
 Cardiology Office Note    Date:  12/18/2023  ID:  Shannon Griffin, DOB 1946-08-26, MRN [id number] PCP:  Noni Saupe, MD  Cardiologist:  Nanetta Batty, MD  Electrophysiologist:  None   Chief Complaint: Shortness of breath   History of Present Illness: .    Shannon Griffin is a 78 y.o. female with visit-pertinent history of paroxysmal atrial fibrillation, OSA, hyperlipidemia, hypothyroidism.  Patient was initially diagnosed with atrial fibrillation in 2012 and had a DCCV in 2015.  In 11/2018 she had recurrent episode of atrial fibrillation with RVR and was seen in the ED are, she underwent DCCV and was converted to sinus rhythm.  On chart review it appears that patient has had intermittent palpitations felt to possibly be atrial fibrillation.  In 05/2021 she was found to be in atrial fibrillation and DCCV was scheduled however on arrival she was found to be back in sinus rhythm.  Patient was last seen by Dr. Allyson Sabal on 01/07/2023, patient reported several episodes of breakthrough atrial fibrillation which were isolated.   Today patient presents for an acute visit for increased shortness of breath for the prior few days, she noted that she awoke yesterday trying to catch her breath while napping.  Patient was unsure if she was in atrial fibrillation. She denies chest pain, dizziness, lightheadedness, orthopnea or pnd. She denies lower extremity edema. Patient denies missing any doses of Eliquis.  She reports aside from increased shortness of breath while walking she is overall doing well.  She does have 2 slipped disc in her back that had been causing her increased pain recently.  She denies any increased caffeine intake and reports she is compliant with her CPAP.  ROS: .   Today she denies chest pain, lower extremity edema, fatigue, palpitations, melena, hematuria, hemoptysis, diaphoresis, weakness, presyncope, syncope, orthopnea, and PND.  All other systems are reviewed and otherwise  negative. Studies Reviewed: Marland Kitchen    EKG:  EKG is ordered today, personally reviewed, demonstrating  EKG Interpretation Date/Time:  Friday December 18 2023 15:39:34 EST Ventricular Rate:  135 PR Interval:    QRS Duration:  66 QT Interval:  264 QTC Calculation: 396 R Axis:   31  Text Interpretation: Atrial fibrillation with rapid ventricular response Low voltage QRS Septal infarct , age undetermined Confirmed by Reather Littler [phone]) on 12/18/2023 5:41:28 PM    CV Studies:  Cardiac Studies & Procedures   ______________________________________________________________________________________________     ECHOCARDIOGRAM  ECHOCARDIOGRAM COMPLETE 03/29/2020  Narrative ECHOCARDIOGRAM REPORT    Patient Name:   Shannon Griffin Date of Exam: 03/29/2020 Medical Rec #:  [id number]      Height:       61.5 in Accession #:    [id number]     Weight:       240.0 lb Date of Birth:  15-Jan-1946      BSA:          2.053 m Patient Age:    74 years       BP:           108/66 mmHg Patient Gender: F              HR:           65 bpm. Exam Location:  [address]  Procedure: 2D Echo, 3D Echo, Cardiac Doppler, Color Doppler and Strain Analysis  Indications:    I48 Atrial fibrillation  History:        Patient has no prior history of Echocardiogram  examinations. Arrythmias:Atrial Fibrillation; Risk Factors:Sleep Apnea, Dyslipidemia and Morbid obesity.  Sonographer:    Garald Braver, RDCS Referring Phys: [phone] JONATHAN J BERRY  IMPRESSIONS   1. Left ventricular ejection fraction, by estimation, is 60 to 65%. The left ventricle has normal function. The left ventricle has no regional wall motion abnormalities. Left ventricular diastolic parameters were normal. The average left ventricular global longitudinal strain is -23.1 %. 2. Right ventricular systolic function is normal. The right ventricular size is normal. There is moderately elevated pulmonary artery systolic pressure. 3. Left atrial size was  mildly dilated. 4. The mitral valve is normal in structure. Trivial mitral valve regurgitation. No evidence of mitral stenosis. 5. The aortic valve is normal in structure. Aortic valve regurgitation is not visualized. No aortic stenosis is present. 6. Aortic dilatation noted. There is mild dilatation of the ascending aorta measuring 38 mm.  FINDINGS Left Ventricle: Left ventricular ejection fraction, by estimation, is 60 to 65%. The left ventricle has normal function. The left ventricle has no regional wall motion abnormalities. The average left ventricular global longitudinal strain is -23.1 %. The left ventricular internal cavity size was normal in size. There is no left ventricular hypertrophy. Left ventricular diastolic parameters were normal.  Right Ventricle: The right ventricular size is normal. No increase in right ventricular wall thickness. Right ventricular systolic function is normal. There is moderately elevated pulmonary artery systolic pressure. The tricuspid regurgitant velocity is 3.05 m/s, and with an assumed right atrial pressure of 8 mmHg, the estimated right ventricular systolic pressure is 45.2 mmHg.  Left Atrium: Left atrial size was mildly dilated.  Right Atrium: Right atrial size was normal in size.  Pericardium: There is no evidence of pericardial effusion.  Mitral Valve: The mitral valve is normal in structure. Trivial mitral valve regurgitation. No evidence of mitral valve stenosis.  Tricuspid Valve: The tricuspid valve is grossly normal. Tricuspid valve regurgitation is mild . No evidence of tricuspid stenosis.  Aortic Valve: The aortic valve is normal in structure. Aortic valve regurgitation is not visualized. No aortic stenosis is present.  Pulmonic Valve: The pulmonic valve was normal in structure. Pulmonic valve regurgitation is mild. No evidence of pulmonic stenosis.  Aorta: Aortic dilatation noted. There is mild dilatation of the ascending aorta measuring  38 mm.  IAS/Shunts: The atrial septum is grossly normal.   LEFT VENTRICLE PLAX 2D LVIDd:         5.30 cm  Diastology LVIDs:         3.70 cm  LV e' lateral:   8.27 cm/s LV PW:         1.00 cm  LV E/e' lateral: 9.3 LV IVS:        1.00 cm  LV e' medial:    6.02 cm/s LVOT diam:     2.10 cm  LV E/e' medial:  12.7 LV SV:         111 LV SV Index:   54       2D Longitudinal Strain LVOT Area:     3.46 cm 2D Strain GLS (A2C):   -25.1 % 2D Strain GLS (A3C):   -21.6 % 2D Strain GLS (A4C):   -22.6 % 2D Strain GLS Avg:     -23.1 %  3D Volume EF: 3D EF:        67 % LV EDV:       118 ml LV ESV:       39 ml LV SV:  79 ml  RIGHT VENTRICLE RV Basal diam:  2.70 cm RV S prime:     12.90 cm/s TAPSE (M-mode): 2.3 cm RVSP:           45.2 mmHg  LEFT ATRIUM              Index       RIGHT ATRIUM           Index LA diam:        4.90 cm  2.39 cm/m  RA Pressure: 8.00 mmHg LA Vol (A2C):   54.9 ml  26.74 ml/m RA Area:     13.60 cm LA Vol (A4C):   106.0 ml 51.62 ml/m RA Volume:   33.10 ml  16.12 ml/m LA Biplane Vol: 78.9 ml  38.43 ml/m AORTIC VALVE LVOT Vmax:   123.00 cm/s LVOT Vmean:  91.400 cm/s LVOT VTI:    0.320 m  AORTA Ao Root diam: 3.60 cm Ao Asc diam:  3.80 cm  MITRAL VALVE               TRICUSPID VALVE TR Peak grad:   37.2 mmHg TR Vmax:        305.00 cm/s MV E velocity: 76.70 cm/s  Estimated RAP:  8.00 mmHg MV A velocity: 51.80 cm/s  RVSP:           45.2 mmHg MV E/A ratio:  1.48 SHUNTS Systemic VTI:  0.32 m Systemic Diam: 2.10 cm  Kristeen Miss MD Electronically signed by Kristeen Miss MD Signature Date/Time: 03/29/2020/1:40:27 PM    Final      CT SCANS  CT CARDIAC SCORING (SELF PAY ONLY) 10/26/2019  Addendum 10/26/2019  1:39 PM ADDENDUM REPORT: 10/26/2019 13:37  CLINICAL DATA:  Risk stratification  EXAM: Coronary Calcium Score  TECHNIQUE: The patient was scanned on a [employer] scanner. Axial non-contrast 3 mm slices were carried out through the  heart. The data set was analyzed on a dedicated work station and scored using the Agatson method.  FINDINGS: Non-cardiac: Large hiatal hernia. See separate report from [facility].  Ascending Aorta: Normal caliber.  Mild aortic root atherosclerosis.  Pericardium: Normal.  Coronary arteries: Normal origins.  IMPRESSION: 1. Coronary calcium score of 141. This was 68th percentile for age and sex matched control.  2. Aortic root atherosclerosis.  Lennie Odor, MD   Electronically Signed By: Lennie Odor On: 10/26/2019 13:37  Narrative EXAM: OVER-READ INTERPRETATION  CT CHEST  The following report is an over-read performed by radiologist Dr. Charlett Nose of [facility] on 10/26/2019. This over-read does not include interpretation of cardiac or coronary anatomy or pathology. The coronary calcium score interpretation by the cardiologist is attached.  COMPARISON:  None.  FINDINGS: Vascular: Heart is normal size. Aorta is normal caliber. Calcifications within the aortic root and visualized descending thoracic aorta.  Mediastinum/Nodes: No adenopathy in the lower mediastinum or hila. Small hiatal hernia.  Lungs/Pleura: Linear scarring in the left base. No confluent opacities or effusions.  Upper Abdomen: Imaging into the upper abdomen shows no acute findings.  Musculoskeletal: Chest wall soft tissues are unremarkable. No acute bony abnormality.  IMPRESSION: No acute extra cardiac abnormality.  Small a are.  Aortic atherosclerosis.  Electronically Signed: By: Charlett Nose M.D. On: 10/26/2019 13:20     ______________________________________________________________________________________________        Current Reported Medications:.    Current Meds  Medication Sig   apixaban (ELIQUIS) 5 MG TABS tablet Take 1 tablet (5 mg total) by  mouth 2 (two) times daily.   diltiazem (CARDIZEM) 30 MG tablet Take 1 tablet every 4 hours AS NEEDED  for heart rate >100   ELDERBERRY PO Take 1 tablet by mouth daily.   levothyroxine (SYNTHROID, LEVOTHROID) 112 MCG tablet Take 112 mcg by mouth daily before breakfast. Take 112 mcg by mouth 6 days a week and 56 mcg on the 7th day   Multiple Vitamins-Minerals (MULTIVITAMIN WITH MINERALS) tablet Take 1 tablet by mouth daily.   VALERIAN PO Take 2 tablets by mouth 4 (four) times daily -  with meals and at bedtime.   vitamin C (ASCORBIC ACID) 500 MG tablet Take 500 mg by mouth daily.   vitamin E 400 UNIT capsule Take 400 Units by mouth daily.   [DISCONTINUED] bisoprolol (ZEBETA) 5 MG tablet Take 1/2 tablet in the  morning and take 1 tablet in the evening   [DISCONTINUED] bisoprolol (ZEBETA) 5 MG tablet Take 1 tablet (5 mg total) by mouth daily.   Physical Exam:    VS:  BP 128/82   Pulse (!) 112   Ht 5' 1.5" (1.562 m)   Wt 240 lb (108.9 kg)   SpO2 95%   BMI 44.61 kg/m    Wt Readings from Last 3 Encounters:  12/18/23 240 lb (108.9 kg)  01/07/23 230 lb (104.3 kg)  12/31/21 235 lb 3.2 oz (106.7 kg)    GEN: Well nourished, well developed in no acute distress NECK: No JVD; No carotid bruits CARDIAC: Irregular rate and rhythm, no murmurs, rubs, gallops RESPIRATORY:  Clear to auscultation without rales, wheezing or rhonchi  ABDOMEN: Soft, non-tender, non-distended EXTREMITIES:  No edema; No acute deformity   Asessement and Plan:.    Persistent atrial fibrillation/shortness of breath: Patient with DCCV in 2015 and in 2020.  Was scheduled for DCCV in 2022 however on arrival was found to back in sinus rhythm.  EKG today indicates atrial fibrillation with RVR at 135 bpm.  After allowing patient to sit for few minutes her heart rate decreased to 112 bpm.  Patient denies any feelings of palpitations or increased heart rate, notes that she becomes short of breath faster when walking.  She denies chest pain or lower extremity edema.  Discussed with Dr. Rennis Golden, DOD at [facility] office, he agrees with plan  for cardioversion and increasing bisoprolol to 5 mg twice daily. Reviewed with patient, she is agreeable to cardioversion, please see consent below.  Patient reports adherence with Eliquis, denies any missed dosages.  Check CBC, BMET and TSH.  Will increase patient's bisoprolol to 5 mg twice daily, she has diltiazem 30 mg as needed for heart rate sustained greater than 100 bpm. Reviewed ED precautions. Informed Consent   Shared Decision Making/Informed Consent The risks (stroke, cardiac arrhythmias rarely resulting in the need for a temporary or permanent pacemaker, skin irritation or burns and complications associated with conscious sedation including aspiration, arrhythmia, respiratory failure and death), benefits (restoration of normal sinus rhythm) and alternatives of a direct current cardioversion were explained in detail to Ms. Kattner and she agrees to proceed.      Hypertension: Blood pressure today 128/82.  Continue current antihypertensive regimen.  Hyperlipidemia: Last lipid profile on 01/12/2023 indicated total cholesterol 240, HDL 49, triglycerides 141 and LDL 165.  Patient has previously been referred to Dr. Rennis Golden and has been seen by Pharm.D. lipid clinic team.  She has refused cholesterol-lowering medications.  OSA: Patient reports CPAP compliance.   Disposition: F/u with Dr. Allyson Sabal on 3/18 as scheduled.  Signed, Rip Harbour, NP

## 2023-12-18 ENCOUNTER — Encounter: Payer: Self-pay | Admitting: Cardiology

## 2023-12-18 ENCOUNTER — Ambulatory Visit: Payer: Medicare Other | Attending: Cardiology | Admitting: Cardiology

## 2023-12-18 VITALS — BP 128/82 | HR 112 | Ht 61.5 in | Wt 240.0 lb

## 2023-12-18 DIAGNOSIS — G4733 Obstructive sleep apnea (adult) (pediatric): Secondary | ICD-10-CM

## 2023-12-18 DIAGNOSIS — E782 Mixed hyperlipidemia: Secondary | ICD-10-CM

## 2023-12-18 DIAGNOSIS — I10 Essential (primary) hypertension: Secondary | ICD-10-CM | POA: Diagnosis not present

## 2023-12-18 DIAGNOSIS — I480 Paroxysmal atrial fibrillation: Secondary | ICD-10-CM | POA: Diagnosis not present

## 2023-12-18 DIAGNOSIS — I4819 Other persistent atrial fibrillation: Secondary | ICD-10-CM | POA: Diagnosis not present

## 2023-12-18 MED ORDER — BISOPROLOL FUMARATE 5 MG PO TABS: 5.0000 mg | ORAL_TABLET | Freq: Two times a day (BID) | ORAL | 3 refills | Status: DC

## 2023-12-18 MED ORDER — BISOPROLOL FUMARATE 5 MG PO TABS: 5.0000 mg | ORAL_TABLET | Freq: Every day | ORAL | 3 refills | Status: DC

## 2023-12-18 NOTE — Patient Instructions (Addendum)
 Medication Instructions:  Increase Bisoprolol to 5 mg twice a day *If you need a refill on your cardiac medications before your next appointment, please call your pharmacy*  Lab Work: Today we are going to draw a CBC, Bmet, and TSH If you have labs (blood work) drawn today and your tests are completely normal, you will receive your results only by: MyChart Message (if you have MyChart) OR A paper copy in the mail If you have any lab test that is abnormal or we need to change your treatment, we will call you to review the results.  Testing/Procedures:     Dear Shannon Griffin  You are scheduled for a Cardioversion on Wednesday , March 5 with Dr. Servando Salina.  Please arrive at the [facility] (Main Entrance A) at [facility]: [address] [place], [place] [address] at 7:30 (This time is 1 hour(s) before your procedure to ensure your preparation).   Free valet parking service is available. You will check in at ADMITTING.   *Please Note: You will receive a call the day before your procedure to confirm the appointment time. That time may have changed from the original time based on the schedule for that day.*    DIET:  Nothing to eat or drink after midnight except a sip of water with medications (see medication instructions below)  MEDICATION INSTRUCTIONS: !!IF ANY NEW MEDICATIONS ARE STARTED AFTER TODAY, PLEASE NOTIFY YOUR PROVIDER AS SOON AS POSSIBLE!!  FYI: Medications such as Semaglutide (Ozempic, [place]), Tirzepatide (Mounjaro, Zepbound), Dulaglutide (Trulicity), etc ("GLP1 agonists") AND Canagliflozin (Invokana), Dapagliflozin (Farxiga), Empagliflozin (Jardiance), Ertugliflozin (Steglatro), Bexagliflozin [employer]) or any combination with one of these drugs such as Invokamet (Canagliflozin/Metformin), Synjardy (Empagliflozin/Metformin), etc ("SGLT2 inhibitors") must be held around the time of a procedure. This is not a comprehensive list of all of these drugs. Please review all of your  medications and talk to your provider if you take any one of these. If you are not sure, ask your provider.  Continue taking your anticoagulant (blood thinner): Apixaban (Eliquis).  You will need to continue this after your procedure until you are told by your provider that it is safe to stop.    LABS:   FYI:  For your safety, and to allow [place] to monitor your vital signs accurately during the surgery/procedure we request: If you have artificial nails, gel coating, SNS etc, please have those removed prior to your surgery/procedure. Not having the nail coverings /polish removed may result in cancellation or delay of your surgery/procedure.  Your support person will be asked to wait in the waiting room during your procedure.  It is OK to have someone drop you off and come back when you are ready to be discharged.  You cannot drive after the procedure and will need someone to drive you home.  Bring your insurance cards.  *Special Note: Every effort is made to have your procedure done on time. Occasionally there are emergencies that occur at the hospital that may cause delays. Please be patient if a delay does occur.   Follow-Up: At [facility], you and your health needs are our priority.  As part of our continuing mission to provide you with exceptional heart care, we have created designated Provider Care Teams.  These Care Teams include your primary Cardiologist (physician) and Advanced Practice Providers (APPs -  Physician Assistants and Nurse Practitioners) who all work together to provide you with the care you need, when you need it.  We recommend signing  up for the patient portal called "MyChart".  Sign up information is provided on this After Visit Summary.  MyChart is used to connect with patients for Virtual Visits (Telemedicine).  Patients are able to view lab/test results, encounter notes, upcoming appointments, etc.  Non-urgent messages can be sent to your provider as well.   To  learn more about what you can do with MyChart, go to [email or web address].    Your next appointment:   Already scheduled

## 2023-12-19 LAB — CBC
Hematocrit: 49 % — ABNORMAL HIGH (ref 34.0–46.6)
Hemoglobin: 14.9 g/dL (ref 11.1–15.9)
MCH: 25.8 pg — ABNORMAL LOW (ref 26.6–33.0)
MCHC: 30.4 g/dL — ABNORMAL LOW (ref 31.5–35.7)
MCV: 85 fL (ref 79–97)
Platelets: 211 10*3/uL (ref 150–450)
RBC: 5.77 x10E6/uL — ABNORMAL HIGH (ref 3.77–5.28)
RDW: 14.9 % (ref 11.7–15.4)
WBC: 10 10*3/uL (ref 3.4–10.8)

## 2023-12-19 LAB — TSH: TSH: 4.15 u[IU]/mL (ref 0.450–4.500)

## 2023-12-19 LAB — BASIC METABOLIC PANEL
BUN/Creatinine Ratio: 17 (ref 12–28)
BUN: 17 mg/dL (ref 8–27)
CO2: 25 mmol/L (ref 20–29)
Calcium: 10 mg/dL (ref 8.7–10.3)
Chloride: 106 mmol/L (ref 96–106)
Creatinine, Ser: 1 mg/dL (ref 0.57–1.00)
Glucose: 89 mg/dL (ref 70–99)
Potassium: 5 mmol/L (ref 3.5–5.2)
Sodium: 144 mmol/L (ref 134–144)
eGFR: 58 mL/min/{1.73_m2} — ABNORMAL LOW (ref >59)

## 2023-12-22 ENCOUNTER — Telehealth: Payer: Self-pay

## 2023-12-22 NOTE — Telephone Encounter (Signed)
-----   Message from Rip Harbour sent at 12/20/2023  9:53 AM EST ----- Please let Ms. Maiden know that her labs are stable, she has normal kidney function and normal electrolytes. Her thyroid function is normal. She can proceed with cardioversion as planned.

## 2023-12-22 NOTE — Progress Notes (Signed)
 Spoke to pt and instructed them to come at 0730 and to be NPO after 0000.  Confirmed no missed doses of AC and instructed to take in AM with a small sip of water.  Confirmed that pt will have a ride home and someone to stay with them for 24 hours after the procedure. Instructed patient to not wear any jewelry or lotion.

## 2023-12-22 NOTE — Telephone Encounter (Signed)
 Called patient advised of below they verbalized understanding.

## 2023-12-23 ENCOUNTER — Other Ambulatory Visit: Payer: Self-pay

## 2023-12-23 ENCOUNTER — Ambulatory Visit (HOSPITAL_COMMUNITY): Admitting: Anesthesiology

## 2023-12-23 ENCOUNTER — Encounter (HOSPITAL_COMMUNITY): Payer: Self-pay | Admitting: Cardiology

## 2023-12-23 ENCOUNTER — Encounter (HOSPITAL_COMMUNITY)
Admission: RE | Disposition: A | Discharge status: Home / Self Care | Payer: Self-pay | Source: Ambulatory Visit | Attending: Cardiology

## 2023-12-23 ENCOUNTER — Ambulatory Visit (HOSPITAL_COMMUNITY)
Admission: RE | Admit: 2023-12-23 | Discharge: 2023-12-23 | Disposition: A | Discharge status: Home / Self Care | Payer: Medicare Other | Source: Ambulatory Visit | Attending: Cardiology | Admitting: Cardiology

## 2023-12-23 DIAGNOSIS — I4891 Unspecified atrial fibrillation: Secondary | ICD-10-CM

## 2023-12-23 DIAGNOSIS — Z7901 Long term (current) use of anticoagulants: Secondary | ICD-10-CM | POA: Diagnosis not present

## 2023-12-23 DIAGNOSIS — I480 Paroxysmal atrial fibrillation: Secondary | ICD-10-CM | POA: Diagnosis present

## 2023-12-23 DIAGNOSIS — E039 Hypothyroidism, unspecified: Secondary | ICD-10-CM | POA: Diagnosis not present

## 2023-12-23 DIAGNOSIS — R0602 Shortness of breath: Secondary | ICD-10-CM | POA: Diagnosis not present

## 2023-12-23 DIAGNOSIS — E785 Hyperlipidemia, unspecified: Secondary | ICD-10-CM | POA: Diagnosis not present

## 2023-12-23 DIAGNOSIS — Z6841 Body Mass Index (BMI) 40.0 and over, adult: Secondary | ICD-10-CM | POA: Diagnosis not present

## 2023-12-23 DIAGNOSIS — I4819 Other persistent atrial fibrillation: Secondary | ICD-10-CM | POA: Insufficient documentation

## 2023-12-23 DIAGNOSIS — Z79899 Other long term (current) drug therapy: Secondary | ICD-10-CM | POA: Diagnosis not present

## 2023-12-23 DIAGNOSIS — G4730 Sleep apnea, unspecified: Secondary | ICD-10-CM

## 2023-12-23 DIAGNOSIS — G4733 Obstructive sleep apnea (adult) (pediatric): Secondary | ICD-10-CM | POA: Insufficient documentation

## 2023-12-23 HISTORY — PX: CARDIOVERSION: EP1203

## 2023-12-23 SURGERY — CARDIOVERSION (CATH LAB)
Anesthesia: General

## 2023-12-23 MED ORDER — PROPOFOL 10 MG/ML IV BOLUS
INTRAVENOUS | Status: DC | PRN
Start: 2023-12-23 — End: 2023-12-23
  Administered 2023-12-23: 60 mg via INTRAVENOUS
  Administered 2023-12-23: 20 mg via INTRAVENOUS

## 2023-12-23 SURGICAL SUPPLY — 1 items: PAD DEFIB RADIO PHYSIO CONN (PAD) ×1 IMPLANT

## 2023-12-23 NOTE — CV Procedure (Signed)
   Electrical Cardioversion Procedure Note Shannon Griffin [id number] 1945/11/01  Procedure: Electrical Cardioversion Indications:  Atrial Fibrillation  Time Out: Verified patient identification, verified procedure,medications/allergies/relevent history reviewed, required imaging and test results available.  Performed  Procedure Details  The patient signed informed consent.   The patient was NPO past midnight. Has had therapeutic anticoagulation with Eliquis greater than 3 weeks. The patient denies any interruption of anticoagulation.  Anesthesia was administered by Dr. Curt Jews.  Adequate airway was maintained throughout and vital followed per protocol.  He was cardioverted x 3 with 200J first 2 rounds and then 300J of biphasic synchronized energy.  SHe converted to NSR.  There were no apparent complications.  The patient tolerated the procedure well and had normal neuro status and respiratory status post procedure with vitals stable as recorded elsewhere.     IMPRESSION:  Successful cardioversion of atrial fibrillation   Follow up:  We will arrange follow up with primary cardiologist.  He will continue on current medical therapy.  The patient advised to continue anticoagulation.  [name] 12/23/2023, 8:43 AM

## 2023-12-23 NOTE — Transfer of Care (Signed)
 Immediate Anesthesia Transfer of Care Note  Patient: Shannon Griffin  Procedure(s) Performed: CARDIOVERSION  Patient Location: Cath Lab  Anesthesia Type:MAC  Level of Consciousness: awake, alert , oriented, and patient cooperative  Airway & Oxygen Therapy: Patient Spontanous Breathing and Patient connected to face mask oxygen  Post-op Assessment: Report given to RN, Post -op Vital signs reviewed and stable, Patient moving all extremities, Patient moving all extremities X 4, and Patient able to stick tongue midline  Post vital signs: Reviewed and stable  Last Vitals:  Vitals Value Taken Time  BP    Temp    Pulse    Resp    SpO2      Last Pain:  Vitals:   12/23/23 0737  TempSrc: Temporal         Complications: No notable events documented.

## 2023-12-23 NOTE — Interval H&P Note (Signed)
 History and Physical Interval Note:  12/23/2023 8:08 AM  Shannon Griffin  has presented today for surgery, with the diagnosis of AFIB.  The various methods of treatment have been discussed with the patient and family. After consideration of risks, benefits and other options for treatment, the patient has consented to  Procedure(s): CARDIOVERSION (N/A) as a surgical intervention.  The patient's history has been reviewed, patient examined, no change in status, stable for surgery.  I have reviewed the patient's chart and labs.  Questions were answered to the patient's satisfaction.     Chael Urenda

## 2023-12-23 NOTE — Anesthesia Postprocedure Evaluation (Signed)
 Anesthesia Post Note  Patient: Shannon Griffin  Procedure(s) Performed: CARDIOVERSION     Patient location during evaluation: PACU Anesthesia Type: General Level of consciousness: awake and alert Pain management: pain level controlled Vital Signs Assessment: post-procedure vital signs reviewed and stable Respiratory status: spontaneous breathing, nonlabored ventilation, respiratory function stable and patient connected to nasal cannula oxygen Cardiovascular status: blood pressure returned to baseline and stable Postop Assessment: no apparent nausea or vomiting Anesthetic complications: no   No notable events documented.  Last Vitals:  Vitals:   12/23/23 0737 12/23/23 0850  BP: (!) 152/103 109/66  Pulse: (!) 120   Resp: (!) 22   Temp: 36.6 C 36.6 C  SpO2: 96% 95%    Last Pain:  Vitals:   12/23/23 0850  TempSrc: Temporal  PainSc: 0-No pain                 Earl Lites P Kynnedi Zweig

## 2023-12-23 NOTE — Anesthesia Preprocedure Evaluation (Signed)
 Anesthesia Evaluation  Patient identified by MRN, date of birth, ID band Patient awake    Reviewed: Allergy & Precautions, NPO status , Patient's Chart, lab work & pertinent test results  History of Anesthesia Complications (+) PONV and history of anesthetic complications  Airway Mallampati: II  TM Distance: >3 FB Neck ROM: Full    Dental no notable dental hx.    Pulmonary sleep apnea , Recent URI    Pulmonary exam normal        Cardiovascular + dysrhythmias Atrial Fibrillation  Rhythm:Irregular Rate:Normal     Neuro/Psych negative neurological ROS  negative psych ROS   GI/Hepatic negative GI ROS, Neg liver ROS,,,  Endo/Other  Hypothyroidism    Renal/GU negative Renal ROS  negative genitourinary   Musculoskeletal negative musculoskeletal ROS (+)    Abdominal Normal abdominal exam  (+)   Peds  Hematology Lab Results      Component                Value               Date                      WBC                      10.0                12/18/2023                HGB                      14.9                12/18/2023                HCT                      49.0 (H)            12/18/2023                MCV                      85                  12/18/2023                PLT                      211                 12/18/2023              Anesthesia Other Findings   Reproductive/Obstetrics                             Anesthesia Physical Anesthesia Plan  ASA: 3  Anesthesia Plan: General   Post-op Pain Management:    Induction: Intravenous  PONV Risk Score and Plan: 4 or greater and Treatment may vary due to age or medical condition  Airway Management Planned: Mask  Additional Equipment: None  Intra-op Plan:   Post-operative Plan:   Informed Consent: I have reviewed the patients History and Physical, chart, labs and discussed the procedure including the risks, benefits and  alternatives for the proposed anesthesia with the patient  or authorized representative who has indicated his/her understanding and acceptance.     Dental advisory given  Plan Discussed with: CRNA  Anesthesia Plan Comments:        Anesthesia Quick Evaluation

## 2024-01-05 ENCOUNTER — Ambulatory Visit: Payer: Medicare Other | Attending: Cardiovascular Disease | Admitting: Cardiovascular Disease

## 2024-01-05 ENCOUNTER — Encounter: Payer: Self-pay | Admitting: Cardiovascular Disease

## 2024-01-05 ENCOUNTER — Ambulatory Visit: Payer: Medicare Other | Admitting: Cardiovascular Disease

## 2024-01-05 VITALS — BP 176/71 | HR 61 | Ht 61.5 in | Wt 236.0 lb

## 2024-01-05 DIAGNOSIS — E782 Mixed hyperlipidemia: Secondary | ICD-10-CM | POA: Diagnosis not present

## 2024-01-05 DIAGNOSIS — I480 Paroxysmal atrial fibrillation: Secondary | ICD-10-CM | POA: Diagnosis not present

## 2024-01-05 DIAGNOSIS — I10 Essential (primary) hypertension: Secondary | ICD-10-CM | POA: Insufficient documentation

## 2024-01-05 DIAGNOSIS — G4733 Obstructive sleep apnea (adult) (pediatric): Secondary | ICD-10-CM | POA: Diagnosis not present

## 2024-01-05 DIAGNOSIS — R931 Abnormal findings on diagnostic imaging of heart and coronary circulation: Secondary | ICD-10-CM | POA: Insufficient documentation

## 2024-01-05 NOTE — Assessment & Plan Note (Signed)
 Coronary calcium score performed 10/26/2019 was 141.  The patient denies chest pain.  She is not at goal for secondary prevention.

## 2024-01-05 NOTE — Patient Instructions (Addendum)
 Medication Instructions:  Your physician recommends that you continue on your current medications as directed. Please refer to the Current Medication list given to you today.  *If you need a refill on your cardiac medications before your next appointment, please call your pharmacy*   Follow-Up: At [facility], you and your health needs are our priority.  As part of our continuing mission to provide you with exceptional heart care, we have created designated Provider Care Teams.  These Care Teams include your primary Cardiologist (physician) and Advanced Practice Providers (APPs -  Physician Assistants and Nurse Practitioners) who all work together to provide you with the care you need, when you need it.  We recommend signing up for the patient portal called "MyChart".  Sign up information is provided on this After Visit Summary.  MyChart is used to connect with patients for Virtual Visits (Telemedicine).  Patients are able to view lab/test results, encounter notes, upcoming appointments, etc.  Non-urgent messages can be sent to your provider as well.   To learn more about what you can do with MyChart, go to [email or web address].    Your next appointment:   4 week(s)  Provider:   Robet Leu, PA-C, Azalee Course, PA-C, Bernadene Person, NP, or Reather Littler, NP       Then, Nanetta Batty, MD will plan to see you again in 12 month(s).  Other Instructions Dr. Allyson Sabal would like for you to keep a blood pressure log. Please bring your log with you to your next office visit.   HOW TO TAKE YOUR BLOOD PRESSURE: Rest 5 minutes before taking your blood pressure. Don't smoke or drink caffeinated beverages for at least 30 minutes before. Take your blood pressure before (not after) you eat. Sit comfortably with your back supported and both feet on the floor (don't cross your legs). Elevate your arm to heart level on a table or a desk. Use the proper sized cuff. It should fit smoothly and snugly  around your bare upper arm. There should be enough room to slip a fingertip under the cuff. The bottom edge of the cuff should be 1 inch above the crease of the elbow. Ideally, take 3 measurements at one sitting and record the average.    1st Floor: - Lobby - Registration  - Pharmacy  - Lab - Cafe  2nd Floor: - PV Lab - Diagnostic Testing (echo, CT, nuclear med)  3rd Floor: - Vacant  4th Floor: - TCTS (cardiothoracic surgery) - AFib Clinic - Structural Heart Clinic - Vascular Surgery  - Vascular Ultrasound  5th Floor: - HeartCare Cardiology (general and EP) - Clinical Pharmacy for coumadin, hypertension, lipid, weight-loss medications, and med management appointments    Valet parking services will be available as well.

## 2024-01-05 NOTE — Assessment & Plan Note (Signed)
 History of essential hypertension only on Zebeta 5 mg.  Her blood pressure today is 176/71.  She says she takes her blood pressure at home and is usually much lower than this.  I have asked her to keep a blood pressure log for 30 days and we will have this reviewed in 4 weeks to make further recommendations.

## 2024-01-05 NOTE — Assessment & Plan Note (Signed)
 History of PAF status post cardioversion in the past.  She is recently seen by Reather Littler 12/18/2023 with shortness of breath and A-fib.  She underwent successful cardioversion by Dr. Servando Salina 12/23/2023 to sinus rhythm and she feels clinically improved.  She remains on Eliquis.

## 2024-01-05 NOTE — Progress Notes (Signed)
 01/05/2024 Shannon Griffin   01-31-1946  [id number]  Primary Physician Jeanie Sewer Shannon Hart, MD Primary Cardiologist: [name] Nicholes Calamity, [place]  HPI:  Shannon Griffin is a 78 y.o.  moderately overweight widowed Caucasian female mother of one daughter, grandmother and one grandchild who I last saw 01/07/2023.Shannon Griffin She relocated from [place] to be closer to her family. Her history is remarkable for paroxysmal atrial fibrillation and mild hyperlipidemia as well as obesity and obstructive sleep apnea. She wears C Pap which she benefits from.. She denies chest pain or shortness of breath. She had a 2-D echocardiogram performed in the past that showed normal LV size and function with moderate to severe left atrial enlargement. Since I saw her a year ago she's remained stable. She does work in the yard. She is unable to lose weight however. She gets occasional episodes of palpitations which may be brief though infrequent episodes of PAF currently in sinus rhythm. The CHA2DSVASC2 score is 2  . We discussed the risks and benefits of oral anticoagulation with regard to stroke prophylaxis. She is currently on Eliquis oral anticoagulation. Since I saw her year ago she's remained asymptomatic specifically denying chest pain. She does have some mild dyspnea on exertion probably multifactorial related to obesity and deconditioning   She did have an episode of A. fib with RVR brought her to the emergency room on 12/05/2018.  She underwent DC cardioversion by Dr. Patria Mane successfully back to sinus rhythm.  She saw Shannon Coco, NP in the A. fib clinic several days later in sinus rhythm.  Otherwise, she gets some dyspnea on exertion.  She denies chest pain.  She continues to wear CPAP for her sleep apnea.  She is had little success with weight loss however.    She has occasional episodes of PAF with which resolve quickly and spontaneously.  She really has been unsuccessful with weight loss.  She does wear  her CPAP.  She denies chest pain or shortness of breath but does complain of daytime somnolence.  She had a lipid profile performed 10/11/2019 that revealed total cholesterol 207 with an LDL of 135.  She is resistant to starting statin therapy.  I referred her to Dr. Rennis Golden.  She was started on Zetia which was ineffective.  She went on a diet which did not affect her lipid profile.  PCSK9 was raised in her discussion with Dr. Rennis Golden which she was hesitant to pursue at that time but now is receptive.   Since I saw her in the office a year ago she did see Shannon Griffin in the office 12/10/2023 with increasing shortness of breath.  She was found to be in A-fib.  She underwent elective outpatient DC cardioversion by Dr. Servando Salina  on 12/23/2023 with marked improvement in her symptoms.  She remains in sinus rhythm today clinically.  She did have a coronary calcium score performed 10/26/2019 which was elevated at 141.   Current Meds  Medication Sig   apixaban (ELIQUIS) 5 MG TABS tablet Take 1 tablet (5 mg total) by mouth 2 (two) times daily.   Bacitracin-Polymyxin B (NEOSPORIN EX) Apply 1 Application topically as needed (skin folds).   bisoprolol (ZEBETA) 5 MG tablet Take 1 tablet (5 mg total) by mouth in the morning and at bedtime.   diltiazem (CARDIZEM) 30 MG tablet Take 1 tablet every 4 hours AS NEEDED for heart rate >100   ELDERBERRY PO Take 1 tablet by mouth daily.  hydroxypropyl methylcellulose / hypromellose (ISOPTO TEARS / GONIOVISC) 2.5 % ophthalmic solution Place 1 drop into both eyes in the morning and at bedtime.   levothyroxine (SYNTHROID, LEVOTHROID) 112 MCG tablet Take 112 mcg by mouth See admin instructions. Take 1 tablet (112 mcg) by mouth on Mondays to Saturdays in the morning & skip dose on Sundays.   Multiple Vitamins-Minerals (MULTIVITAMIN WITH MINERALS) tablet Take 1 tablet by mouth daily after lunch.   vitamin C (ASCORBIC ACID) 500 MG tablet Take 500 mg by mouth daily after lunch.   vitamin E  400 UNIT capsule Take 400 Units by mouth daily after lunch.   Zinc Oxide (DESITIN EX) Apply 1 Application topically as needed (skin folds).     Allergies  Allergen Reactions   Freeze It [Camphor-Menthol] Swelling    Histofreeze   Lidocaine Swelling    Social History   Socioeconomic History   Marital status: Widowed    Spouse name: Not on file   Number of children: Not on file   Years of education: Not on file   Highest education level: Not on file  Occupational History   Not on file  Tobacco Use   Smoking status: Never   Smokeless tobacco: Never  Substance and Sexual Activity   Alcohol use: No   Drug use: No   Sexual activity: Never  Other Topics Concern   Not on file  Social History Narrative   Not on file   Social Drivers of Health   Financial Resource Strain: Not on file  Food Insecurity: Not on file  Transportation Needs: Not on file  Physical Activity: Not on file  Stress: Not on file  Social Connections: Not on file  Intimate Partner Violence: Not on file     Review of Systems: General: negative for chills, fever, night sweats or weight changes.  Cardiovascular: negative for chest pain, dyspnea on exertion, edema, orthopnea, palpitations, paroxysmal nocturnal dyspnea or shortness of breath Dermatological: negative for rash Respiratory: negative for cough or wheezing Urologic: negative for hematuria Abdominal: negative for nausea, vomiting, diarrhea, bright red blood per rectum, melena, or hematemesis Neurologic: negative for visual changes, syncope, or dizziness All other systems reviewed and are otherwise negative except as noted above.    Blood pressure (!) 176/71, pulse 61, height 5' 1.5" (1.562 m), weight 236 lb (107 kg), SpO2 99%.  General appearance: alert and no distress Neck: no adenopathy, no carotid bruit, no JVD, supple, symmetrical, trachea midline, and thyroid not enlarged, symmetric, no tenderness/mass/nodules Lungs: clear to auscultation  bilaterally Heart: regular rate and rhythm, S1, S2 normal, no murmur, click, rub or gallop Abdomen: soft, non-tender; bowel sounds normal; no masses,  no organomegaly Pulses: 2+ and symmetric Skin: Skin color, texture, turgor normal. No rashes or lesions Neurologic: Grossly normal  EKG not performed today      ASSESSMENT AND PLAN:   Paroxysmal atrial fibrillation (HCC) History of PAF status post cardioversion in the past.  She is recently seen by Shannon Griffin 12/18/2023 with shortness of breath and A-fib.  She underwent successful cardioversion by Dr. Servando Salina 12/23/2023 to sinus rhythm and she feels clinically improved.  She remains on Eliquis.  Hyperlipidemia History of hyperlipidemia intolerant to statin therapy with lipid profile performed 01/12/2023 revealing total cholesterol 240, LDL 165 and HDL of 49.  She has seen Dr. Rennis Golden in the past.  I am referring her back to him to discuss PCSK9.  Obstructive sleep apnea History of obstructive sleep apnea on CPAP which she  benefits from.  Morbid obesity (HCC) Morbid obesity with a BMI of 44 unable to lose weight on her own.  Elevated coronary artery calcium score Coronary calcium score performed 10/26/2019 was 141.  The patient denies chest pain.  She is not at goal for secondary prevention.  Essential hypertension History of essential hypertension only on Zebeta 5 mg.  Her blood pressure today is 176/71.  She says she takes her blood pressure at home and is usually much lower than this.  I have asked her to keep a blood pressure log for 30 days and we will have this reviewed in 4 weeks to make further recommendations.     [name] FACP,FACC,FAHA, [facility] 01/05/2024 11:31 AM

## 2024-01-05 NOTE — Assessment & Plan Note (Signed)
 History of obstructive sleep apnea on CPAP which she benefits from

## 2024-01-05 NOTE — Assessment & Plan Note (Signed)
 History of hyperlipidemia intolerant to statin therapy with lipid profile performed 01/12/2023 revealing total cholesterol 240, LDL 165 and HDL of 49.  She has seen Dr. Rennis Golden in the past.  I am referring her back to him to discuss PCSK9.

## 2024-01-05 NOTE — Assessment & Plan Note (Signed)
 Morbid obesity with a BMI of 44 unable to lose weight on her own.

## 2024-01-12 ENCOUNTER — Other Ambulatory Visit (HOSPITAL_COMMUNITY): Payer: Self-pay

## 2024-01-12 ENCOUNTER — Ambulatory Visit (INDEPENDENT_AMBULATORY_CARE_PROVIDER_SITE_OTHER): Admitting: Internal Medicine

## 2024-01-12 VITALS — BP 132/68 | HR 82 | Ht 61.0 in | Wt 237.4 lb

## 2024-01-12 DIAGNOSIS — E782 Mixed hyperlipidemia: Secondary | ICD-10-CM | POA: Diagnosis not present

## 2024-01-12 DIAGNOSIS — R931 Abnormal findings on diagnostic imaging of heart and coronary circulation: Secondary | ICD-10-CM | POA: Diagnosis not present

## 2024-01-12 DIAGNOSIS — T466X5D Adverse effect of antihyperlipidemic and antiarteriosclerotic drugs, subsequent encounter: Secondary | ICD-10-CM

## 2024-01-12 DIAGNOSIS — E785 Hyperlipidemia, unspecified: Secondary | ICD-10-CM | POA: Diagnosis not present

## 2024-01-12 DIAGNOSIS — T466X5A Adverse effect of antihyperlipidemic and antiarteriosclerotic drugs, initial encounter: Secondary | ICD-10-CM

## 2024-01-12 DIAGNOSIS — M791 Myalgia, unspecified site: Secondary | ICD-10-CM

## 2024-01-12 NOTE — Patient Instructions (Signed)
 Medication Instructions:  Medication option: Leqvio    Lab Work: NMR lipoprofile and LPa today   Follow-Up: At [facility], you and your health needs are our priority.  As part of our continuing mission to provide you with exceptional heart care, we have created designated Provider Care Teams.  These Care Teams include your primary Cardiologist (physician) and Advanced Practice Providers (APPs -  Physician Assistants and Nurse Practitioners) who all work together to provide you with the care you need, when you need it.  We recommend signing up for the patient portal called "MyChart".  Sign up information is provided on this After Visit Summary.  MyChart is used to connect with patients for Virtual Visits (Telemedicine).  Patients are able to view lab/test results, encounter notes, upcoming appointments, etc.  Non-urgent messages can be sent to your provider as well.   To learn more about what you can do with MyChart, go to [email or web address].    Your next appointment:   AS NEEDED with Dr. Rennis Golden  Other Instructions   1st Floor: - Lobby - Registration  - Pharmacy  - Lab - Cafe  2nd Floor: - PV Lab - Diagnostic Testing (echo, CT, nuclear med)  3rd Floor: - Vacant  4th Floor: - TCTS (cardiothoracic surgery) - AFib Clinic - Structural Heart Clinic - Vascular Surgery  - Vascular Ultrasound  5th Floor: - HeartCare Cardiology (general and EP) - Clinical Pharmacy for coumadin, hypertension, lipid, weight-loss medications, and med management appointments    Valet parking services will be available as well.

## 2024-01-12 NOTE — Progress Notes (Signed)
 LIPID CLINIC CONSULT NOTE  Chief Complaint:  Follow-up dyslipidemia  Primary Care Physician: Shannon Saupe, MD  Primary Cardiologist:  Shannon Batty, MD  HPI:  Shannon Griffin is a pleasant female with a history of dyslipidemia, atrial fibrillation, and OSA on CPAP which is well controlled.  She was referred by Dr. Allyson Griffin for evaluation management of dyslipidemia.  Heart disease but unfortunately she has been intolerant of a number of medications and other products over the years.  She related to me today that her brother had died and an autopsy report indicated that the statin was somehow implicated in his death.  Based on this she is terrified of statin medications and is really not interested in trying them.  This makes options for lipid therapy quite difficult as most of the more advanced lipid therapies require failure medication.  More recently her lipids indicated 207, triglycerides 179, HDL 44 and LDL 131.  Based on her risk factors I would target her to at least an LDL less than 100, however she did in January 2021 which was 141, 68th percentile for age and sex matched control.  Aortic root atherosclerosis was also noted.  Based on these findings I would be more aggressively target LDL less than 70.  After discussing this, however she is quite concerned about side effects as well.    08/21/2020   Ms. Shannon Griffin is seen today via telephone follow-up.  She never started the ezetimibe because she has a history of thyroid disease and some hyperglycemia and she read that it could worsen that.  She also would not take the medicine to help her lose weight that was prescribed by her PCP which she said later was recalled due to issues that it might cause cancer.  She says she has little faith in the medical system and in medications.  She also told me that she had some friends who are nurses that suggested that the lipid profile was not accurate in older adults.  Based on that it does not  sound like she is interested in additional medications although she may qualify for them.  I advised her that she does have age advanced coronary disease and significant risk factors for stroke and heart disease.  She remains above target LDL therapy and does have higher than expected risk based on her dyslipidemia.   04/18/2021  Ms. Shannon Griffin is seen today in follow-up via a video visit..  She recently saw Dr. Allyson Griffin and was noted not to be taking any lipid-lowering therapies.  The ezetimibe was not tolerable.  She is adamant about not taking statins as described above.  Based on this she would not qualify for PCSK9 inhibitor.  She is really reticent about trying a statin because her brother who passed away had autopsy findings suggesting that the statin contributed to his death.  This is very unusual and incredibly unlikely however she said that she cannot mentally get to a point where she could feel comfortable taking statin medications.  I think it is therefore unlikely that insurance would cover a PCSK9 inhibitor for this indication  01/12/2024  Ms. Shannon Griffin is seen today in follow-up.  Dr. Allyson Griffin send her back for reevaluation because after talking she seemed that she was more inclined to consider a PCSK9 inhibitor.  She reports that she had tried atorvastatin for about 10 days.  After reducing the dose that she had "profound" side effects including significant joint aches and worsening arthritis which she says has "  never been the same" still, now almost 1 year after stopping the medication.  It is very unlikely for statin side effects to not ever resolve off the medication.  She is now considered PCSK9 inhibitors.  We discussed both the antibody PCSK9 numbers and the RNA PCSK9 inhibitor Leqvio today.  Her insurance might be favorable for that however there are concerns about side effects including risk of long-term side effects.  Even though the dosing is less frequently Leqvio, side effects are typically  within the first week when the medication is more effective and then unlikely to happen afterwards.  They do not last for 6 months.  PMHx:  Past Medical History:  Diagnosis Date   Angio-edema    Atrial fibrillation (HCC) 06/27/2011   Echo - EF 55-60%, LV wall thickness mildly increased; mod/severe LA dilation; estimated pulm arterial systolic pressure elevated at 86-57QION   Heart murmur    Hyperlipidemia    Hypothyroid    OSA on CPAP 10/25/2012   ahi 1.5   PONV (postoperative nausea and vomiting)    Recurrent upper respiratory infection (URI)     Past Surgical History:  Procedure Laterality Date   CARDIOVERSION N/A 12/23/2023   Procedure: CARDIOVERSION;  Surgeon: Shannon Ripple, DO;  Location: [facility] INVASIVE CV LAB;  Service: Cardiovascular;  Laterality: N/A;   CHOLECYSTECTOMY     TONSILLECTOMY      FAMHx:  Family History  Problem Relation Age of Onset   Atrial fibrillation Mother    Atrial fibrillation Brother     SOCHx:   reports that she has never smoked. She has never used smokeless tobacco. She reports that she does not drink alcohol and does not use drugs.  ALLERGIES:  Allergies  Allergen Reactions   Freeze It [Camphor-Menthol] Swelling    Histofreeze   Lidocaine Swelling    ROS: Pertinent items noted in HPI and remainder of comprehensive ROS otherwise negative.  HOME MEDS: Current Outpatient Medications on File Prior to Visit  Medication Sig Dispense Refill   apixaban (ELIQUIS) 5 MG TABS tablet Take 1 tablet (5 mg total) by mouth 2 (two) times daily. 180 tablet 1   Bacitracin-Polymyxin B (NEOSPORIN EX) Apply 1 Application topically as needed (skin folds).     bisoprolol (ZEBETA) 5 MG tablet Take 1 tablet (5 mg total) by mouth in the morning and at bedtime. 180 tablet 3   diltiazem (CARDIZEM) 30 MG tablet Take 1 tablet every 4 hours AS NEEDED for heart rate >100 30 tablet 1   ELDERBERRY PO Take 1 tablet by mouth daily.     hydroxypropyl methylcellulose /  hypromellose (ISOPTO TEARS / GONIOVISC) 2.5 % ophthalmic solution Place 1 drop into both eyes in the morning and at bedtime.     levothyroxine (SYNTHROID, LEVOTHROID) 112 MCG tablet Take 112 mcg by mouth See admin instructions. Take 1 tablet (112 mcg) by mouth on Mondays to Saturdays in the morning & skip dose on Sundays.     Multiple Vitamins-Minerals (MULTIVITAMIN WITH MINERALS) tablet Take 1 tablet by mouth daily after lunch.     vitamin C (ASCORBIC ACID) 500 MG tablet Take 500 mg by mouth daily after lunch.     vitamin E 400 UNIT capsule Take 400 Units by mouth daily after lunch.     Zinc Oxide (DESITIN EX) Apply 1 Application topically as needed (skin folds).     No current facility-administered medications on file prior to visit.    LABS/IMAGING: No results found for this or any  previous visit (from the past 48 hours). No results found.  LIPID PANEL:    Component Value Date/Time   CHOL 240 (H) 01/12/2023 0843   TRIG 141 01/12/2023 0843   HDL 49 01/12/2023 0843   CHOLHDL 4.9 (H) 01/12/2023 0843   LDLCALC 165 (H) 01/12/2023 0843    WEIGHTS: Wt Readings from Last 3 Encounters:  01/05/24 236 lb (107 kg)  12/18/23 240 lb (108.9 kg)  01/07/23 230 lb (104.3 kg)    VITALS: There were no vitals taken for this visit.  EXAM: Deferred  EKG: Deferred  ASSESSMENT: Mixed dyslipidemia, goal LDL less than 70 Statin intolerant-myalgias Abnormal CAC score of 141, 68th percentile for age and sex matched control Aortic root atherosclerosis Family history of coronary disease Atrial fibrillation on Eliquis  PLAN: 1.   Mr. Esperanza is seen today for follow-up.  Unfortunately she could not tolerate atorvastatin recently.  Her LDL target is less than 70 however her last lipid in March 2024 showed an LDL of 165.  She is now possibly considering PCSK9 inhibitor therapy.  We discussed the pros and cons of that as well as Leqvio today.  Her insurance might be favorable for that.  I have  advised her to look into both of those.  Will update her labs today including an NMR and LP(a) as well as a CK because of ongoing symptoms to at least get a baseline and to see if that is possibly elevated.  Will plan follow-up as needed if she wishes to consider therapy.  Chrystie Nose, MD, [facility], FACP  [place]  [facility]  Medical Director of the Advanced Lipid Disorders &  Cardiovascular Risk Reduction Clinic Diplomate of the American Board of Clinical Lipidology Attending Cardiologist  Direct Dial: [phone]  Fax: [phone]  Website:  [email or web address] 01/12/2024, 9:34 AM

## 2024-01-13 ENCOUNTER — Encounter (HOSPITAL_BASED_OUTPATIENT_CLINIC_OR_DEPARTMENT_OTHER): Payer: Self-pay | Admitting: Internal Medicine

## 2024-01-13 LAB — LIPOPROTEIN A (LPA): Lipoprotein (a): 32.1 nmol/L (ref <75.0)

## 2024-01-13 LAB — CK: Total CK: 29 U/L — ABNORMAL LOW (ref 32–182)

## 2024-01-13 LAB — NMR, LIPOPROFILE
Cholesterol, Total: 239 mg/dL — ABNORMAL HIGH (ref 100–199)
HDL Particle Number: 30.9 umol/L (ref >=30.5)
HDL-C: 39 mg/dL — ABNORMAL LOW (ref >39)
LDL Particle Number: 2400 nmol/L — ABNORMAL HIGH (ref <1000)
LDL Size: 19.8 nm — ABNORMAL LOW (ref >20.5)
LDL-C (NIH Calc): 155 mg/dL — ABNORMAL HIGH (ref 0–99)
LP-IR Score: 84 — ABNORMAL HIGH (ref <=45)
Small LDL Particle Number: 1416 nmol/L — ABNORMAL HIGH (ref <=527)
Triglycerides: 246 mg/dL — ABNORMAL HIGH (ref 0–149)

## 2024-01-18 ENCOUNTER — Other Ambulatory Visit: Payer: Self-pay | Admitting: Internal Medicine

## 2024-01-18 ENCOUNTER — Telehealth: Admitting: Cardiovascular Disease

## 2024-01-18 ENCOUNTER — Telehealth: Payer: Self-pay | Admitting: Pharmacy Technician

## 2024-01-18 ENCOUNTER — Other Ambulatory Visit (HOSPITAL_COMMUNITY): Payer: Self-pay

## 2024-01-18 DIAGNOSIS — E785 Hyperlipidemia, unspecified: Secondary | ICD-10-CM

## 2024-01-18 NOTE — Telephone Encounter (Signed)
 Spoke with patient about PCSK9. Per last visit, Leqvio AND Repatha/Praluent were discussed as options. Patient was going to research.   In speaking with patient today, she prefers Leqvio instead of q2week PCSK9. Advised her on steps for benefits check for Leqvio. Advised will send her a MyChart message w/same info. Informed her she'll get an email from [email or web address] also to sign off for benefits check

## 2024-01-18 NOTE — Telephone Encounter (Signed)
 Patient calling about a medication that was suppose to be prescribe to her. But the medication is not listed. Please advise

## 2024-01-18 NOTE — Telephone Encounter (Signed)
 Shannon Griffin, I have submitted the PA over the phone with [facility]. I wanted to go ahead and let you know that it will likely get denied because they do require her to try Repatha or Praluent first.  Thank you, Dot Lanes

## 2024-01-18 NOTE — Telephone Encounter (Signed)
 Pharmacy Patient Advocate Encounter   Received notification from Pt Calls Messages that prior authorization for Repatha is required/requested.   Insurance verification completed.   The patient is insured through  [employer]  .   Per test claim: PA required; PA submitted to above mentioned insurance via CoverMyMeds Key/confirmation #/EOC [id number] Status is pending

## 2024-01-18 NOTE — Telephone Encounter (Signed)
 Pharmacy Patient Advocate Encounter  Received notification from  [employer]  that Prior Authorization for Repatha has been APPROVED from 01/18/24 to 01/17/25. Ran test claim, Copay is $20.00- one month. This test claim was processed through [facility]- copay amounts may vary at other pharmacies due to pharmacy/plan contracts, or as the patient moves through the different stages of their insurance plan.   PA #/Case ID/Reference #: [id number]

## 2024-01-18 NOTE — Telephone Encounter (Signed)
 Called patient. Verified name and DOB.  Patient is wondering what she should do about her PCSK9 inhibitor medication prior authorization. I informed patient it can take a few days for prior authorizations to be completed. She would like more information about the status of the prior auth. Please advise.  Josie LPN

## 2024-01-20 ENCOUNTER — Telehealth: Payer: Self-pay

## 2024-01-20 NOTE — Telephone Encounter (Signed)
 Dr. Rennis Golden and Eileen Stanford, see denial below.  Auth Submission: DENIED Site of care: Site of care: [facility] Payer: [employer] Medication & CPT/J Code(s) submitted: Leqvio (Inclisiran) [id number]  Authorization has been DENIED because the patient need to try and fail Repatha or Praluent first. The denial letter gives a lot more info regarding statins as well. Denial letter is in the media tab.

## 2024-01-25 MED ORDER — REPATHA SURECLICK 140 MG/ML ~~LOC~~ SOAJ: 140.0000 mg | SUBCUTANEOUS | 3 refills | Status: DC

## 2024-01-25 NOTE — Telephone Encounter (Signed)
 Patient aware of Leqvio denial. Agrees to try Repatha. Message sent about Rx and follow up labs. Rx(s) sent to pharmacy electronically.

## 2024-01-25 NOTE — Telephone Encounter (Signed)
 Patient agreeable to Repatha, after Leqvio approved. Rx(s) sent to pharmacy electronically. NMR lipoprofile ordered/mailed

## 2024-01-27 ENCOUNTER — Telehealth: Payer: Self-pay | Admitting: Cardiology

## 2024-01-27 MED ORDER — BISOPROLOL FUMARATE 5 MG PO TABS: 5.0000 mg | ORAL_TABLET | Freq: Two times a day (BID) | ORAL | 3 refills | Status: DC

## 2024-01-27 NOTE — Telephone Encounter (Signed)
*  STAT* If patient is at the pharmacy, call can be transferred to refill team.   1. Which medications need to be refilled? (please list name of each medication and dose if known)   bisoprolol (ZEBETA) 5 MG tablet    2. Which pharmacy/location (including street and city if local pharmacy) is medication to be sent to?  [facility] [place], [place] - [address]      3. Do they need a 30 day or 90 day supply? 90 day

## 2024-01-31 NOTE — Progress Notes (Unsigned)
 Cardiology Office Note    Date:  02/02/2024  ID:  Shannon Griffin, DOB 18-Apr-1946, MRN [id number] PCP:  Madelyne Schiff, MD  Cardiologist:  Lauro Portal, MD  Electrophysiologist:  None   Chief Complaint: Atrial fibrillation   History of Present Illness: .    Shannon Griffin is a 78 y.o. female with visit-pertinent history of persistent atrial fibrillation, OSA, hyperlipidemia, hypothyroidism.  Patient was initially diagnosed with atrial fibrillation in 2012 and had a DCCV in 2015.  In 11/2018 she had recurrent episode of atrial fibrillation with RVR and was seen in the ED are, she underwent DCCV and was converted to sinus rhythm.  On chart review it appears that patient has had intermittent palpitations felt to possibly be atrial fibrillation.  In 05/2021 she was found to be in atrial fibrillation and DCCV was scheduled however on arrival she was found to be back in sinus rhythm.  Patient was seen by Dr. Katheryne Pane on 01/07/2023, patient reported several episodes of breakthrough atrial fibrillation which were isolated.   Patient was seen in clinic on 12/18/2023 for increased shortness of breath.  She was noted to be in atrial fibrillation and set up for cardioversion, she underwent successful cardioversion on 12/23/2023.  She was seen in clinic by Dr. Katheryne Pane on 01/05/2024 and remained in sinus rhythm.   Today she presents for folllow up. She reports that she has noted increased shortness of breath since this past Friday.  She notes that she has been monitoring her blood pressure at home and has overall been well-controlled.  She denies any chest pain, lower extremity edema, orthopnea or PND.  She denies any palpitations.  Patient notes that she started Repatha on Wednesday, patient notes following injection she had a prickly feeling over her skin that resolved after an hour, the following day she had severe body aches and pains that have persisted since injection.  ROS: .   Today she denies chest  pain, lower extremity edema, fatigue, palpitations, melena, hematuria, hemoptysis, diaphoresis, weakness, presyncope, syncope, orthopnea, and PND.  All other systems are reviewed and otherwise negative. Studies Reviewed: Aaron Aas    EKG:  EKG is ordered today, personally reviewed, demonstrating  EKG Interpretation Date/Time:  Tuesday February 02 2024 11:57:32 EDT Ventricular Rate:  126 PR Interval:    QRS Duration:  74 QT Interval:  336 QTC Calculation: 486 R Axis:   50  Text Interpretation: Atrial fibrillation with rapid ventricular response with premature ventricular or aberrantly conducted complexes Abnormal QRS-T angle, consider primary T wave abnormality When compared with ECG of 23-Dec-2023 08:43, Atrial fibrillation has replaced Sinus rhythm Vent. rate has increased BY  68 BPM Confirmed by Meloni Hinz [phone]) on 02/02/2024 9:18:33 PM   CV Studies: Cardiac studies reviewed are outlined and summarized above. Otherwise please see EMR for full report. Cardiac Studies & Procedures   ______________________________________________________________________________________________     ECHOCARDIOGRAM  ECHOCARDIOGRAM COMPLETE 03/29/2020  Narrative ECHOCARDIOGRAM REPORT    Patient Name:   Shannon Griffin Date of Exam: 03/29/2020 Medical Rec #:  [id number]      Height:       61.5 in Accession #:    [id number]     Weight:       240.0 lb Date of Birth:  03-05-1946      BSA:          2.053 m Patient Age:    74 years       BP:  108/66 mmHg Patient Gender: F              HR:           65 bpm. Exam Location:  [address]  Procedure: 2D Echo, 3D Echo, Cardiac Doppler, Color Doppler and Strain Analysis  Indications:    I48 Atrial fibrillation  History:        Patient has no prior history of Echocardiogram examinations. Arrythmias:Atrial Fibrillation; Risk Factors:Sleep Apnea, Dyslipidemia and Morbid obesity.  Sonographer:    Henriette Lofty, RDCS Referring Phys: [id number] JONATHAN J  BERRY  IMPRESSIONS   1. Left ventricular ejection fraction, by estimation, is 60 to 65%. The left ventricle has normal function. The left ventricle has no regional wall motion abnormalities. Left ventricular diastolic parameters were normal. The average left ventricular global longitudinal strain is -23.1 %. 2. Right ventricular systolic function is normal. The right ventricular size is normal. There is moderately elevated pulmonary artery systolic pressure. 3. Left atrial size was mildly dilated. 4. The mitral valve is normal in structure. Trivial mitral valve regurgitation. No evidence of mitral stenosis. 5. The aortic valve is normal in structure. Aortic valve regurgitation is not visualized. No aortic stenosis is present. 6. Aortic dilatation noted. There is mild dilatation of the ascending aorta measuring 38 mm.  FINDINGS Left Ventricle: Left ventricular ejection fraction, by estimation, is 60 to 65%. The left ventricle has normal function. The left ventricle has no regional wall motion abnormalities. The average left ventricular global longitudinal strain is -23.1 %. The left ventricular internal cavity size was normal in size. There is no left ventricular hypertrophy. Left ventricular diastolic parameters were normal.  Right Ventricle: The right ventricular size is normal. No increase in right ventricular wall thickness. Right ventricular systolic function is normal. There is moderately elevated pulmonary artery systolic pressure. The tricuspid regurgitant velocity is 3.05 m/s, and with an assumed right atrial pressure of 8 mmHg, the estimated right ventricular systolic pressure is 45.2 mmHg.  Left Atrium: Left atrial size was mildly dilated.  Right Atrium: Right atrial size was normal in size.  Pericardium: There is no evidence of pericardial effusion.  Mitral Valve: The mitral valve is normal in structure. Trivial mitral valve regurgitation. No evidence of mitral valve  stenosis.  Tricuspid Valve: The tricuspid valve is grossly normal. Tricuspid valve regurgitation is mild . No evidence of tricuspid stenosis.  Aortic Valve: The aortic valve is normal in structure. Aortic valve regurgitation is not visualized. No aortic stenosis is present.  Pulmonic Valve: The pulmonic valve was normal in structure. Pulmonic valve regurgitation is mild. No evidence of pulmonic stenosis.  Aorta: Aortic dilatation noted. There is mild dilatation of the ascending aorta measuring 38 mm.  IAS/Shunts: The atrial septum is grossly normal.   LEFT VENTRICLE PLAX 2D LVIDd:         5.30 cm  Diastology LVIDs:         3.70 cm  LV e' lateral:   8.27 cm/s LV PW:         1.00 cm  LV E/e' lateral: 9.3 LV IVS:        1.00 cm  LV e' medial:    6.02 cm/s LVOT diam:     2.10 cm  LV E/e' medial:  12.7 LV SV:         111 LV SV Index:   54       2D Longitudinal Strain LVOT Area:     3.46 cm 2D Strain  GLS (A2C):   -25.1 % 2D Strain GLS (A3C):   -21.6 % 2D Strain GLS (A4C):   -22.6 % 2D Strain GLS Avg:     -23.1 %  3D Volume EF: 3D EF:        67 % LV EDV:       118 ml LV ESV:       39 ml LV SV:        79 ml  RIGHT VENTRICLE RV Basal diam:  2.70 cm RV S prime:     12.90 cm/s TAPSE (M-mode): 2.3 cm RVSP:           45.2 mmHg  LEFT ATRIUM              Index       RIGHT ATRIUM           Index LA diam:        4.90 cm  2.39 cm/m  RA Pressure: 8.00 mmHg LA Vol (A2C):   54.9 ml  26.74 ml/m RA Area:     13.60 cm LA Vol (A4C):   106.0 ml 51.62 ml/m RA Volume:   33.10 ml  16.12 ml/m LA Biplane Vol: 78.9 ml  38.43 ml/m AORTIC VALVE LVOT Vmax:   123.00 cm/s LVOT Vmean:  91.400 cm/s LVOT VTI:    0.320 m  AORTA Ao Root diam: 3.60 cm Ao Asc diam:  3.80 cm  MITRAL VALVE               TRICUSPID VALVE TR Peak grad:   37.2 mmHg TR Vmax:        305.00 cm/s MV E velocity: 76.70 cm/s  Estimated RAP:  8.00 mmHg MV A velocity: 51.80 cm/s  RVSP:           45.2 mmHg MV E/A ratio:   1.48 SHUNTS Systemic VTI:  0.32 m Systemic Diam: 2.10 cm  Ahmad Alert MD Electronically signed by Ahmad Alert MD Signature Date/Time: 03/29/2020/1:40:27 PM    Final      CT SCANS  CT CARDIAC SCORING (SELF PAY ONLY) 10/26/2019  Addendum 10/26/2019  1:39 PM ADDENDUM REPORT: 10/26/2019 13:37  CLINICAL DATA:  Risk stratification  EXAM: Coronary Calcium Score  TECHNIQUE: The patient was scanned on a [employer] scanner. Axial non-contrast 3 mm slices were carried out through the heart. The data set was analyzed on a dedicated work station and scored using the Agatson method.  FINDINGS: Non-cardiac: Large hiatal hernia. See separate report from [facility].  Ascending Aorta: Normal caliber.  Mild aortic root atherosclerosis.  Pericardium: Normal.  Coronary arteries: Normal origins.  IMPRESSION: 1. Coronary calcium score of 141. This was 68th percentile for age and sex matched control.  2. Aortic root atherosclerosis.  Jackquelyn Mass, MD   Electronically Signed By: Jackquelyn Mass On: 10/26/2019 13:37  Narrative EXAM: OVER-READ INTERPRETATION  CT CHEST  The following report is an over-read performed by radiologist Dr. Janeece Mechanic of [facility] on 10/26/2019. This over-read does not include interpretation of cardiac or coronary anatomy or pathology. The coronary calcium score interpretation by the cardiologist is attached.  COMPARISON:  None.  FINDINGS: Vascular: Heart is normal size. Aorta is normal caliber. Calcifications within the aortic root and visualized descending thoracic aorta.  Mediastinum/Nodes: No adenopathy in the lower mediastinum or hila. Small hiatal hernia.  Lungs/Pleura: Linear scarring in the left base. No confluent opacities or effusions.  Upper Abdomen: Imaging into the upper abdomen shows no acute  findings.  Musculoskeletal: Chest wall soft tissues are unremarkable. No acute bony  abnormality.  IMPRESSION: No acute extra cardiac abnormality.  Small a are.  Aortic atherosclerosis.  Electronically Signed: By: Janeece Mechanic M.D. On: 10/26/2019 13:20     ______________________________________________________________________________________________       Current Reported Medications:.    Current Meds  Medication Sig   apixaban (ELIQUIS) 5 MG TABS tablet Take 1 tablet (5 mg total) by mouth 2 (two) times daily.   Bacitracin-Polymyxin B (NEOSPORIN EX) Apply 1 Application topically as needed (skin folds).   bisoprolol (ZEBETA) 5 MG tablet Take 1 tablet (5 mg total) by mouth in the morning and at bedtime.   diltiazem (CARDIZEM) 30 MG tablet Take 1 tablet every 4 hours AS NEEDED for heart rate >100   ELDERBERRY PO Take 1 tablet by mouth daily.   Evolocumab (REPATHA SURECLICK) 140 MG/ML SOAJ Inject 140 mg into the skin every 14 (fourteen) days.   hydroxypropyl methylcellulose / hypromellose (ISOPTO TEARS / GONIOVISC) 2.5 % ophthalmic solution Place 1 drop into both eyes in the morning and at bedtime.   levothyroxine (SYNTHROID, LEVOTHROID) 112 MCG tablet Take 112 mcg by mouth See admin instructions. Take 1 tablet (112 mcg) by mouth on Mondays to Saturdays in the morning & skip dose on Sundays.   Multiple Vitamins-Minerals (MULTIVITAMIN WITH MINERALS) tablet Take 1 tablet by mouth daily after lunch.   vitamin C (ASCORBIC ACID) 500 MG tablet Take 500 mg by mouth daily after lunch.   vitamin E 400 UNIT capsule Take 400 Units by mouth daily after lunch.   Zinc Oxide (DESITIN EX) Apply 1 Application topically as needed (skin folds).    Physical Exam:    VS:  BP 132/84   Pulse 92   Ht 5' 1.5" (1.562 m)   Wt 242 lb (109.8 kg)   SpO2 97%   BMI 44.99 kg/m    Wt Readings from Last 3 Encounters:  02/02/24 242 lb (109.8 kg)  01/12/24 237 lb 6.4 oz (107.7 kg)  01/05/24 236 lb (107 kg)    GEN: Well nourished, well developed in no acute distress NECK: No JVD; No carotid  bruits CARDIAC: IRR, no murmurs, rubs, gallops RESPIRATORY:  Clear to auscultation without rales, wheezing or rhonchi  ABDOMEN: Soft, non-tender, non-distended EXTREMITIES:  No edema; No acute deformity     Asessement and Plan:.    Persistent atrial fibrillation: Patient with DCCV in 2015 and in 2020. Was scheduled for DCCV in 2022 however on arrival was found to back in sinus rhythm.  Patient presented on 12/10/2023 with increased shortness of breath, found to be in atrial fibrillation, underwent successful cardioversion on 12/23/2023, on follow-up with Dr. Katheryne Pane on 3/18 patient remained in sinus rhythm.  Today her EKG indicates atrial fibrillation with RVR, noted to have improvement in heart rates by end of visit to the upper 80s.  Discussed with patient that given recurrent atrial fibrillation would recommend that she again follow-up with atrial fibrillation clinic, patient in agreement.  Continue bisoprolol 5 mg twice daily, patient to take as needed diltiazem for elevations in heart rate.  Continue Eliquis 5 mg twice daily, patient denies any bleeding problems.  Check CBC, BMET and magnesium level.   Hyperlipidemia: Patient now followed by Dr. Maximo Spar with lipid clinic.  Patient notes that she had first Repatha injection last week and has had significant body aches since injection, does not feel she will able to tolerate this.  Will send message to Dr.  Hilty to make him aware.  OSA: Patient reports nightly CPAP compliance.  Hypertension: Blood pressure today 132/84.  Continue current antihypertensive regimen.    Disposition: F/u with afib clinic at next available appointment. F/u with Kieron Kantner, NP in 4-6 weeks.   Signed, Wilberto Console D Skylynne Schlechter, NP

## 2024-02-02 ENCOUNTER — Ambulatory Visit: Attending: Cardiology | Admitting: Cardiology

## 2024-02-02 ENCOUNTER — Encounter: Payer: Self-pay | Admitting: Cardiology

## 2024-02-02 VITALS — BP 132/84 | HR 92 | Ht 61.5 in | Wt 242.0 lb

## 2024-02-02 DIAGNOSIS — I10 Essential (primary) hypertension: Secondary | ICD-10-CM | POA: Insufficient documentation

## 2024-02-02 DIAGNOSIS — I480 Paroxysmal atrial fibrillation: Secondary | ICD-10-CM | POA: Diagnosis not present

## 2024-02-02 DIAGNOSIS — G4733 Obstructive sleep apnea (adult) (pediatric): Secondary | ICD-10-CM | POA: Insufficient documentation

## 2024-02-02 DIAGNOSIS — E782 Mixed hyperlipidemia: Secondary | ICD-10-CM | POA: Diagnosis not present

## 2024-02-02 NOTE — Patient Instructions (Addendum)
 Medication Instructions:  NO CHANGES   Lab Work: CBC, BMET, AND MAGNESIUM TO BE DONE TODAY   Testing/Procedures: NONE  Follow-Up: At [facility], you and your health needs are our priority.  As part of our continuing mission to provide you with exceptional heart care, our providers are all part of one team.  This team includes your primary Cardiologist (physician) and Advanced Practice Providers or APPs (Physician Assistants and Nurse Practitioners) who all work together to provide you with the care you need, when you need it.  Your next appointment:   4-6 WEEKS  Provider:   KATLYN WEST, NP   Other Instructions:  REFERRAL HAS BEEN SENT TO THE AFIB CLINIC.      1st Floor: - Lobby - Registration  - Pharmacy  - Lab - Cafe  2nd Floor: - PV Lab - Diagnostic Testing (echo, CT, nuclear med)  3rd Floor: - Vacant  4th Floor: - TCTS (cardiothoracic surgery) - AFib Clinic - Structural Heart Clinic - Vascular Surgery  - Vascular Ultrasound  5th Floor: - [facility] (general and EP) - Clinical Pharmacy for coumadin, hypertension, lipid, weight-loss medications, and med management appointments    Valet parking services will be available as well.

## 2024-02-03 ENCOUNTER — Telehealth: Payer: Self-pay | Admitting: Internal Medicine

## 2024-02-03 ENCOUNTER — Other Ambulatory Visit (HOSPITAL_COMMUNITY): Payer: Self-pay

## 2024-02-03 ENCOUNTER — Telehealth: Payer: Self-pay | Admitting: Pharmacy Technician

## 2024-02-03 ENCOUNTER — Telehealth: Payer: Self-pay

## 2024-02-03 DIAGNOSIS — E782 Mixed hyperlipidemia: Secondary | ICD-10-CM

## 2024-02-03 DIAGNOSIS — I480 Paroxysmal atrial fibrillation: Secondary | ICD-10-CM

## 2024-02-03 LAB — BASIC METABOLIC PANEL WITH GFR
BUN/Creatinine Ratio: 17 (ref 12–28)
BUN: 19 mg/dL (ref 8–27)
CO2: 23 mmol/L (ref 20–29)
Calcium: 10.1 mg/dL (ref 8.7–10.3)
Chloride: 105 mmol/L (ref 96–106)
Creatinine, Ser: 1.1 mg/dL — ABNORMAL HIGH (ref 0.57–1.00)
Glucose: 86 mg/dL (ref 70–99)
Potassium: 5.3 mmol/L — ABNORMAL HIGH (ref 3.5–5.2)
Sodium: 145 mmol/L — ABNORMAL HIGH (ref 134–144)
eGFR: 51 mL/min/{1.73_m2} — ABNORMAL LOW (ref >59)

## 2024-02-03 LAB — CBC
Hematocrit: 46.8 % — ABNORMAL HIGH (ref 34.0–46.6)
Hemoglobin: 14.8 g/dL (ref 11.1–15.9)
MCH: 27 pg (ref 26.6–33.0)
MCHC: 31.6 g/dL (ref 31.5–35.7)
MCV: 85 fL (ref 79–97)
Platelets: 198 10*3/uL (ref 150–450)
RBC: 5.48 x10E6/uL — ABNORMAL HIGH (ref 3.77–5.28)
RDW: 16.8 % — ABNORMAL HIGH (ref 11.7–15.4)
WBC: 9.7 10*3/uL (ref 3.4–10.8)

## 2024-02-03 LAB — MAGNESIUM: Magnesium: 2.2 mg/dL (ref 1.6–2.3)

## 2024-02-03 MED ORDER — BISOPROLOL FUMARATE 10 MG PO TABS: 5.0000 mg | ORAL_TABLET | Freq: Two times a day (BID) | ORAL | 3 refills | Status: DC

## 2024-02-03 NOTE — Telephone Encounter (Signed)
 Message about med change sent to patient in MyChart

## 2024-02-03 NOTE — Telephone Encounter (Deleted)
 Called patient advised of below they verbalized understanding.

## 2024-02-03 NOTE — Telephone Encounter (Signed)
 Hennessy, Stephanie, CPhT  You1 minute ago (10:10 AM)    Hi, the insurance is saying the max is 1 and 1/2 tablets a day. They will pay for bisoprolol 10mg  at 1/2 a tablet twice a day. Per the test claim it would be 5.00 for 30 days, that is if it is ok to change it to bisoprolol 10mg  1/2 tablet twice a day? Thank you!    Routed to K. [facility] NP

## 2024-02-03 NOTE — Telephone Encounter (Signed)
 That is fine to change to a 10 mg tablet and for her to cut in half. She will take half a tablet twice a day.   Katlyn West NP

## 2024-02-03 NOTE — Telephone Encounter (Signed)
  Pt c/o medication issue:  1. Name of Medication:   bisoprolol (ZEBETA) 5 MG tablet    2. How are you currently taking this medication (dosage and times per day)? Take 1 tablet (5 mg total) by mouth in the morning and at bedtime.   3. Are you having a reaction (difficulty breathing--STAT)? No   4. What is your medication issue? Geraldine from [employer] said, they need to get prior auth and need to answer some clinical questions for this medication

## 2024-02-03 NOTE — Telephone Encounter (Signed)
 You  Shannon Griffin M, RNJust now (10:10 AM)    Hi, the insurance is saying the max is 1 and 1/2 tablets a day. They will pay for bisoprolol 10mg  at 1/2 a tablet twice a day. Per the test claim it would be 5.00 for 30 days, that is if it is ok to change it to bisoprolol 10mg  1/2 tablet twice a day? Thank you!   Shannon Inks, RN routed conversation to Rx Prior Auth Team41 minutes ago (9:29 AM)   Shannon Griffin, RN41 minutes ago (9:29 AM)    Routed to PA team      Note   Shannon Griffin, Angeline S routed conversation to Cv Div Nl Triage1 hour ago (9:06 AM)   Shannon Griffin, Angeline S1 hour ago (9:06 AM)   AH   Pt c/o medication issue:   1. Name of Medication:   bisoprolol (ZEBETA) 5 MG tablet      2. How are you currently taking this medication (dosage and times per day)? Take 1 tablet (5 mg total) by mouth in the morning and at bedtime.    3. Are you having a reaction (difficulty breathing--STAT)? No    4. What is your medication issue? Geraldine from [employer] said, they need to get prior auth and need to answer some clinical questions for this medication

## 2024-02-03 NOTE — Telephone Encounter (Signed)
 West, Katlyn D, NP  Cv Div Nl Triage; Dominga Friedman, [place] minutes ago (10:24 AM)    That is fine to change to a 10 mg tablet and for her to cut in half. She will take half a tablet twice a day. Katlyn West, NP

## 2024-02-03 NOTE — Telephone Encounter (Signed)
 Message about med change sent to patient in MyChart      This was changed to 10mg  1/2 tab bid

## 2024-02-03 NOTE — Telephone Encounter (Signed)
 Called patient advised of below they verbalized understanding.

## 2024-02-03 NOTE — Telephone Encounter (Signed)
 Routed to PA team

## 2024-02-03 NOTE — Telephone Encounter (Signed)
-----   Message from Katlyn D West sent at 02/03/2024 10:19 AM EDT ----- Please let Ms. Caudillo know that her creatinine is mildly elevated, as is her sodium level, please remind her to stay well-hydrated with water.  Her potassium level is slightly elevated, she should stop intake of any potassium supplements or electrolyte beverages such as liquid IV or Gatorade.  She should reduce her intake of high potassium foods such as bananas, squash, yogurt, white beans, sweet potatoes, leafy greens, and avocados. Recheck BMET in one week. Her CBC shows no evidence of anemia or infection. Her magnesium level is normal.

## 2024-03-07 NOTE — Progress Notes (Signed)
 Cardiology Office Note    Date:  03/10/2024  ID:  Shannon Griffin, DOB [birth year], MRN [id number] PCP:  Shannon Schiff, MD  Cardiologist:  Shannon Portal, MD  Electrophysiologist:  None   Chief Complaint: Follow up for atrial fibrillation   History of Present Illness: .    Shannon Griffin is a 78 y.o. female with visit-pertinent history of persistent atrial fibrillation, OSA, hyperlipidemia, hypothyroidism.  Patient was initially diagnosed with atrial fibrillation in 2012 and had a DCCV in 2015.  In 11/2018 she had recurrent episode of atrial fibrillation with RVR and was seen in the ED are, she underwent DCCV and was converted to sinus rhythm.  On chart review it appears that patient has had intermittent palpitations felt to possibly be atrial fibrillation.  In 05/2021 she was found to be in atrial fibrillation and DCCV was scheduled however on arrival she was found to be back in sinus rhythm.  Patient was seen by Shannon Griffin on 01/07/2023, patient reported several episodes of breakthrough atrial fibrillation which were isolated.   Patient was seen in clinic on 12/18/2023 for increased shortness of breath.  She was noted to be in atrial fibrillation and set up for cardioversion, she underwent successful cardioversion on 12/23/2023.  She was seen in clinic by Shannon Griffin on 01/05/2024 and remained in sinus rhythm.   On follow-up on 02/02/2024 patient reported increased shortness of breath since the past Friday.  Her EKG indicated she had returned back to atrial fibrillation.  Patient also noted that following her injection today prickly feeling over her skin, and the following day she had severe body aches and pains that have persisted.  Patient was referred to the atrial fibrillation clinic.  Today she presents for follow up. She reports that she has been feeling poorly, she has been having fatigue and shortness of breath with ongoing activity.  Patient reports that she is only taken her as needed  Cardizem  twice in the last month, notes when she is at home she has been simply sitting and snacking as with activity she fatigues quickly.  She denies any chest pain, increased lower extremity edema, orthopnea or PND.  She reports compliance with her CPAP nightly.  Patient reports that at home her heart rates are typically overall well-controlled, consistently in the 80' bpm.  Patient has follow-up with atrial fibrillation clinic scheduled in 2 weeks.  She denies any dizziness, lightheadedness, presyncope or syncope.   ROS: .   Today she denies chest pain, lower extremity edema, palpitations, melena, hematuria, hemoptysis, diaphoresis, weakness, presyncope, syncope, orthopnea, and PND.  All other systems are reviewed and otherwise negative. Studies Reviewed: Aaron Aas   EKG:  EKG is ordered today, personally reviewed, demonstrating  EKG Interpretation Date/Time:  Thursday Mar 10 2024 10:58:54 EDT Ventricular Rate:  127 PR Interval:    QRS Duration:  72 QT Interval:  300 QTC Calculation: 436 R Axis:   31  Text Interpretation: Atrial fibrillation with rapid ventricular response Septal infarct , age undetermined When compared with ECG of 02-Feb-2024 11:57, No significant change was found Confirmed by Arne Schlender [phone]) on 03/10/2024 11:39:09 AM   CV Studies: Cardiac studies reviewed are outlined and summarized above. Otherwise please see EMR for full report. Cardiac Studies & Procedures   ______________________________________________________________________________________________     ECHOCARDIOGRAM  ECHOCARDIOGRAM COMPLETE 03/29/2020  Narrative ECHOCARDIOGRAM REPORT    Patient Name:   Shannon Griffin Date of Exam: 03/29/2020 Medical Rec #:  [id number]  Height:       61.5 in Accession #:    [id number]     Weight:       240.0 lb Date of Birth:  1946/04/02      BSA:          2.053 m Patient Age:    74 years       BP:           108/66 mmHg Patient Gender: F              HR:           65  bpm. Exam Location:  [address]  Procedure: 2D Echo, 3D Echo, Cardiac Doppler, Color Doppler and Strain Analysis  Indications:    I48 Atrial fibrillation  History:        Patient has no prior history of Echocardiogram examinations. Arrythmias:Atrial Fibrillation; Risk Factors:Sleep Apnea, Dyslipidemia and Morbid obesity.  Sonographer:    Henriette Lofty, RDCS Referring Phys: [phone] JONATHAN J BERRY  IMPRESSIONS   1. Left ventricular ejection fraction, by estimation, is 60 to 65%. The left ventricle has normal function. The left ventricle has no regional wall motion abnormalities. Left ventricular diastolic parameters were normal. The average left ventricular global longitudinal strain is -23.1 %. 2. Right ventricular systolic function is normal. The right ventricular size is normal. There is moderately elevated pulmonary artery systolic pressure. 3. Left atrial size was mildly dilated. 4. The mitral valve is normal in structure. Trivial mitral valve regurgitation. No evidence of mitral stenosis. 5. The aortic valve is normal in structure. Aortic valve regurgitation is not visualized. No aortic stenosis is present. 6. Aortic dilatation noted. There is mild dilatation of the ascending aorta measuring 38 mm.  FINDINGS Left Ventricle: Left ventricular ejection fraction, by estimation, is 60 to 65%. The left ventricle has normal function. The left ventricle has no regional wall motion abnormalities. The average left ventricular global longitudinal strain is -23.1 %. The left ventricular internal cavity size was normal in size. There is no left ventricular hypertrophy. Left ventricular diastolic parameters were normal.  Right Ventricle: The right ventricular size is normal. No increase in right ventricular wall thickness. Right ventricular systolic function is normal. There is moderately elevated pulmonary artery systolic pressure. The tricuspid regurgitant velocity is 3.05 m/s, and with an  assumed right atrial pressure of 8 mmHg, the estimated right ventricular systolic pressure is 45.2 mmHg.  Left Atrium: Left atrial size was mildly dilated.  Right Atrium: Right atrial size was normal in size.  Pericardium: There is no evidence of pericardial effusion.  Mitral Valve: The mitral valve is normal in structure. Trivial mitral valve regurgitation. No evidence of mitral valve stenosis.  Tricuspid Valve: The tricuspid valve is grossly normal. Tricuspid valve regurgitation is mild . No evidence of tricuspid stenosis.  Aortic Valve: The aortic valve is normal in structure. Aortic valve regurgitation is not visualized. No aortic stenosis is present.  Pulmonic Valve: The pulmonic valve was normal in structure. Pulmonic valve regurgitation is mild. No evidence of pulmonic stenosis.  Aorta: Aortic dilatation noted. There is mild dilatation of the ascending aorta measuring 38 mm.  IAS/Shunts: The atrial septum is grossly normal.   LEFT VENTRICLE PLAX 2D LVIDd:         5.30 cm  Diastology LVIDs:         3.70 cm  LV e' lateral:   8.27 cm/s LV PW:         1.00 cm  LV E/e' lateral: 9.3 LV IVS:        1.00 cm  LV e' medial:    6.02 cm/s LVOT diam:     2.10 cm  LV E/e' medial:  12.7 LV SV:         111 LV SV Index:   54       2D Longitudinal Strain LVOT Area:     3.46 cm 2D Strain GLS (A2C):   -25.1 % 2D Strain GLS (A3C):   -21.6 % 2D Strain GLS (A4C):   -22.6 % 2D Strain GLS Avg:     -23.1 %  3D Volume EF: 3D EF:        67 % LV EDV:       118 ml LV ESV:       39 ml LV SV:        79 ml  RIGHT VENTRICLE RV Basal diam:  2.70 cm RV S prime:     12.90 cm/s TAPSE (M-mode): 2.3 cm RVSP:           45.2 mmHg  LEFT ATRIUM              Index       RIGHT ATRIUM           Index LA diam:        4.90 cm  2.39 cm/m  RA Pressure: 8.00 mmHg LA Vol (A2C):   54.9 ml  26.74 ml/m RA Area:     13.60 cm LA Vol (A4C):   106.0 ml 51.62 ml/m RA Volume:   33.10 ml  16.12 ml/m LA Biplane Vol:  78.9 ml  38.43 ml/m AORTIC VALVE LVOT Vmax:   123.00 cm/s LVOT Vmean:  91.400 cm/s LVOT VTI:    0.320 m  AORTA Ao Root diam: 3.60 cm Ao Asc diam:  3.80 cm  MITRAL VALVE               TRICUSPID VALVE TR Peak grad:   37.2 mmHg TR Vmax:        305.00 cm/s MV E velocity: 76.70 cm/s  Estimated RAP:  8.00 mmHg MV A velocity: 51.80 cm/s  RVSP:           45.2 mmHg MV E/A ratio:  1.48 SHUNTS Systemic VTI:  0.32 m Systemic Diam: 2.10 cm  Ahmad Alert MD Electronically signed by Ahmad Alert MD Signature Date/Time: 03/29/2020/1:40:27 PM    Final      CT SCANS  CT CARDIAC SCORING (SELF PAY ONLY) 10/26/2019  Addendum 10/26/2019  1:39 PM ADDENDUM REPORT: 10/26/2019 13:37  CLINICAL DATA:  Risk stratification  EXAM: Coronary Calcium  Score  TECHNIQUE: The patient was scanned on a [employer] scanner. Axial non-contrast 3 mm slices were carried out through the heart. The data set was analyzed on a dedicated work station and scored using the Agatson method.  FINDINGS: Non-cardiac: Large hiatal hernia. See separate report from [facility].  Ascending Aorta: Normal caliber.  Mild aortic root atherosclerosis.  Pericardium: Normal.  Coronary arteries: Normal origins.  IMPRESSION: 1. Coronary calcium  score of 141. This was 68th percentile for age and sex matched control.  2. Aortic root atherosclerosis.  Jackquelyn Mass, MD   Electronically Signed By: Jackquelyn Mass On: 10/26/2019 13:37  Narrative EXAM: OVER-READ INTERPRETATION  CT CHEST  The following report is an over-read performed by radiologist Dr. Janeece Mechanic of [facility] on 10/26/2019. This over-read does not include interpretation of cardiac or coronary anatomy  or pathology. The coronary calcium  score interpretation by the cardiologist is attached.  COMPARISON:  None.  FINDINGS: Vascular: Heart is normal size. Aorta is normal caliber. Calcifications within the aortic root and  visualized descending thoracic aorta.  Mediastinum/Nodes: No adenopathy in the lower mediastinum or hila. Small hiatal hernia.  Lungs/Pleura: Linear scarring in the left base. No confluent opacities or effusions.  Upper Abdomen: Imaging into the upper abdomen shows no acute findings.  Musculoskeletal: Chest wall soft tissues are unremarkable. No acute bony abnormality.  IMPRESSION: No acute extra cardiac abnormality.  Small a are.  Aortic atherosclerosis.  Electronically Signed: By: Janeece Mechanic M.D. On: 10/26/2019 13:20     ______________________________________________________________________________________________       Current Reported Medications:.    Current Meds  Medication Sig   apixaban  (ELIQUIS ) 5 MG TABS tablet Take 1 tablet (5 mg total) by mouth 2 (two) times daily.   Bacitracin-Polymyxin B (NEOSPORIN EX) Apply 1 Application topically as needed (skin folds).   bisoprolol  (ZEBETA ) 10 MG tablet Take 0.5 tablets (5 mg total) by mouth in the morning and at bedtime.   diltiazem  (CARDIZEM  CD) 120 MG 24 hr capsule Take 1 capsule (120 mg total) by mouth daily.   diltiazem  (CARDIZEM ) 30 MG tablet Take 1 tablet every 4 hours AS NEEDED for heart rate >100   ELDERBERRY PO Take 1 tablet by mouth daily.   hydroxypropyl methylcellulose / hypromellose (ISOPTO TEARS / GONIOVISC) 2.5 % ophthalmic solution Place 1 drop into both eyes in the morning and at bedtime.   levothyroxine (SYNTHROID, LEVOTHROID) 112 MCG tablet Take 112 mcg by mouth See admin instructions. Take 1 tablet (112 mcg) by mouth on Mondays to Saturdays in the morning & skip dose on Sundays.   Multiple Vitamins-Minerals (MULTIVITAMIN WITH MINERALS) tablet Take 1 tablet by mouth daily after lunch.   vitamin C (ASCORBIC ACID) 500 MG tablet Take 500 mg by mouth daily after lunch.   vitamin E 400 UNIT capsule Take 400 Units by mouth daily after lunch.   Zinc Oxide (DESITIN EX) Apply 1 Application topically as  needed (skin folds).    Physical Exam:    VS:  BP 138/84   Pulse 98   Ht 5' 1.5" (1.562 m)   Wt 246 lb 3.2 oz (111.7 kg)   SpO2 99%   BMI 45.77 kg/m    Wt Readings from Last 3 Encounters:  03/10/24 246 lb 3.2 oz (111.7 kg)  02/02/24 242 lb (109.8 kg)  01/12/24 237 lb 6.4 oz (107.7 kg)    GEN: Well nourished, well developed in no acute distress NECK: No JVD; No carotid bruits CARDIAC: Irregular RR, no murmurs, rubs, gallops RESPIRATORY:  Clear to auscultation without rales, wheezing or rhonchi  ABDOMEN: Soft, non-tender, non-distended EXTREMITIES:  No edema; No acute deformity     Asessement and Plan:.    Persistent atrial fibrillation: Patient with DCCV in 2015 and in 2020. Was scheduled for DCCV in 2022 however on arrival was found to back in sinus rhythm.  Patient presented on 12/10/2023 with increased shortness of breath, found to be in atrial fibrillation, underwent successful cardioversion on 12/23/2023, on follow-up with Shannon Griffin on 3/18 patient remained in sinus rhythm.  Unfortunately on follow-up on 02/01/2024 her EKG indicated she was back in atrial fibrillation with RVR.  Referral placed to the atrial fibrillation clinic given quick return back into atrial fibrillation.  EKG today indicates patient remains in atrial fibrillation, today was in RVR with heart rate up  to 127 bpm on arrival to office, notes that she had to walk significantly far to get into office, patient's heart rate was 92 bpm by end of visit.  Patient has had some increased shortness of breath and fatigue at home, does report she overall feels stable. Discussed if she felt need to be evaluated in the ED, patient deferred.  Patient overall appears euvolemic and well compensated, did discuss weight gain since last visit, she notes that she has been snacking regularly.  She does not feel that her lower extremities have been swollen and does not feel as though she is holding fluid.  Will check CMET and BNP to ensure no  fluid accumulation contributing to shortness of breath, feel likely more related to elevated heart rates.  Will also check echocardiogram.  For better rate control will start Cardizem  120 mg daily, she has previously tolerated Cardizem  as needed. Check CMET. Reviewed ED precautions, she will follow up with afib clinic in two weeks.   Hyperlipidemia: Patient now followed by Dr. Maximo Spar with lipid clinic.  She was started on Repatha  however resulted in significant body aches.  Patient reports at this time she is not interested in further interventions, through shared decision making agreed to discuss further at follow-up.  OSA: Patient reports nightly CPAP compliance.  Hypertension: Blood pressure today 158/94, on recheck was 130/84.  Will start patient on diltiazem  120 mg daily as noted above, otherwise continue current antihypertensive regimen.    Disposition: F/u with Afib clinic in two weeks, with Erik Burkett, NP in six weeks.   Signed, Jahbari Repinski D Genae Strine, NP

## 2024-03-08 ENCOUNTER — Other Ambulatory Visit (HOSPITAL_COMMUNITY): Payer: Self-pay | Admitting: *Deleted

## 2024-03-10 ENCOUNTER — Ambulatory Visit: Attending: Cardiology | Admitting: Cardiology

## 2024-03-10 ENCOUNTER — Encounter: Payer: Self-pay | Admitting: Cardiology

## 2024-03-10 VITALS — BP 138/84 | HR 98 | Ht 61.5 in | Wt 246.2 lb

## 2024-03-10 DIAGNOSIS — R0602 Shortness of breath: Secondary | ICD-10-CM | POA: Diagnosis not present

## 2024-03-10 DIAGNOSIS — G4733 Obstructive sleep apnea (adult) (pediatric): Secondary | ICD-10-CM | POA: Diagnosis not present

## 2024-03-10 DIAGNOSIS — I4819 Other persistent atrial fibrillation: Secondary | ICD-10-CM | POA: Diagnosis not present

## 2024-03-10 DIAGNOSIS — E782 Mixed hyperlipidemia: Secondary | ICD-10-CM | POA: Diagnosis not present

## 2024-03-10 DIAGNOSIS — I10 Essential (primary) hypertension: Secondary | ICD-10-CM | POA: Diagnosis not present

## 2024-03-10 MED ORDER — DILTIAZEM HCL ER COATED BEADS 120 MG PO CP24: 120.0000 mg | ORAL_CAPSULE | Freq: Every day | ORAL | 11 refills | Status: DC

## 2024-03-10 NOTE — Patient Instructions (Signed)
 Medication Instructions:  Start diltiazem  120 mg once a day  *If you need a refill on your cardiac medications before your next appointment, please call your pharmacy*  Lab Work: Today we are going to draw a Cmet and BNP If you have labs (blood work) drawn today and your tests are completely normal, you will receive your results only by: MyChart Message (if you have MyChart) OR A paper copy in the mail If you have any lab test that is abnormal or we need to change your treatment, we will call you to review the results.  Testing/Procedures: Your physician has requested that you have an echocardiogram. Echocardiography is a painless test that uses sound waves to create images of your heart. It provides your doctor with information about the size and shape of your heart and how well your heart's chambers and valves are working. This procedure takes approximately one hour. There are no restrictions for this procedure. Please do NOT wear cologne, perfume, aftershave, or lotions (deodorant is allowed). Please arrive 15 minutes prior to your appointment time.  Please note: We ask at that you not bring children with you during ultrasound (echo/ vascular) testing. Due to room size and safety concerns, children are not allowed in the ultrasound rooms during exams. Our front office staff cannot provide observation of children in our lobby area while testing is being conducted. An adult accompanying a patient to their appointment will only be allowed in the ultrasound room at the discretion of the ultrasound technician under special circumstances. We apologize for any inconvenience.  Follow-Up: At [facility], you and your health needs are our priority.  As part of our continuing mission to provide you with exceptional heart care, our providers are all part of one team.  This team includes your primary Cardiologist (physician) and Advanced Practice Providers or APPs (Physician Assistants and Nurse  Practitioners) who all work together to provide you with the care you need, when you need it.  Your next appointment:   6 week(s)  Provider:   Katlyn West, NP  We recommend signing up for the patient portal called "MyChart".  Sign up information is provided on this After Visit Summary.  MyChart is used to connect with patients for Virtual Visits (Telemedicine).  Patients are able to view lab/test results, encounter notes, upcoming appointments, etc.  Non-urgent messages can be sent to your provider as well.   To learn more about what you can do with MyChart, go to [email or web address].

## 2024-03-11 ENCOUNTER — Ambulatory Visit: Payer: Self-pay | Admitting: Cardiology

## 2024-03-11 DIAGNOSIS — E782 Mixed hyperlipidemia: Secondary | ICD-10-CM

## 2024-03-11 DIAGNOSIS — I4819 Other persistent atrial fibrillation: Secondary | ICD-10-CM

## 2024-03-11 DIAGNOSIS — Z79899 Other long term (current) drug therapy: Secondary | ICD-10-CM

## 2024-03-11 LAB — COMPREHENSIVE METABOLIC PANEL WITH GFR
ALT: 12 IU/L (ref 0–32)
AST: 16 IU/L (ref 0–40)
Albumin: 4.1 g/dL (ref 3.8–4.8)
Alkaline Phosphatase: 74 IU/L (ref 44–121)
BUN/Creatinine Ratio: 20 (ref 12–28)
BUN: 21 mg/dL (ref 8–27)
Bilirubin Total: 0.6 mg/dL (ref 0.0–1.2)
CO2: 22 mmol/L (ref 20–29)
Calcium: 9.6 mg/dL (ref 8.7–10.3)
Chloride: 106 mmol/L (ref 96–106)
Creatinine, Ser: 1.07 mg/dL — ABNORMAL HIGH (ref 0.57–1.00)
Globulin, Total: 2.5 g/dL (ref 1.5–4.5)
Glucose: 89 mg/dL (ref 70–99)
Potassium: 5 mmol/L (ref 3.5–5.2)
Sodium: 142 mmol/L (ref 134–144)
Total Protein: 6.6 g/dL (ref 6.0–8.5)
eGFR: 53 mL/min/{1.73_m2} — ABNORMAL LOW (ref >59)

## 2024-03-11 LAB — BRAIN NATRIURETIC PEPTIDE: BNP: 567.4 pg/mL — ABNORMAL HIGH (ref 0.0–100.0)

## 2024-03-11 MED ORDER — FUROSEMIDE 40 MG PO TABS: 40.0000 mg | ORAL_TABLET | Freq: Every day | ORAL | 3 refills | Status: DC

## 2024-03-11 NOTE — Telephone Encounter (Signed)
 Called patient advised of below they verbalized understanding Order labs and sent medication

## 2024-03-11 NOTE — Telephone Encounter (Signed)
-----   Message from Lorrin Rotter sent at 03/11/2024  5:11 PM EDT ----- Please let Ms. Dowers know that her kidney function is stable, her potassium level is normal. Her liver function is normal. Her BNP which indicates her fluid level is elevated, recommend she start lasix 40 mg daily and repeat a BMET in two weeks.

## 2024-03-24 ENCOUNTER — Ambulatory Visit (HOSPITAL_COMMUNITY)
Admission: RE | Admit: 2024-03-24 | Discharge: 2024-03-24 | Disposition: A | Discharge status: Home / Self Care | Source: Ambulatory Visit | Attending: Internal Medicine | Admitting: Internal Medicine

## 2024-03-24 VITALS — BP 122/80 | HR 109 | Ht 61.5 in | Wt 236.8 lb

## 2024-03-24 DIAGNOSIS — I4819 Other persistent atrial fibrillation: Secondary | ICD-10-CM | POA: Diagnosis not present

## 2024-03-24 DIAGNOSIS — I4891 Unspecified atrial fibrillation: Secondary | ICD-10-CM | POA: Insufficient documentation

## 2024-03-24 DIAGNOSIS — D6869 Other thrombophilia: Secondary | ICD-10-CM | POA: Insufficient documentation

## 2024-03-24 MED ORDER — DILTIAZEM HCL ER COATED BEADS 180 MG PO CP24: 180.0000 mg | ORAL_CAPSULE | Freq: Every day | ORAL | 2 refills | Status: DC

## 2024-03-24 NOTE — Progress Notes (Signed)
 Primary Care Physician: Madelyne Schiff, MD Primary Cardiologist: Dr Katheryne Pane Primary Electrophysiologist: none Referring Physician: Arlin Benes [facility]   Shannon Griffin is a 78 y.o. female with a history of HLD, HTN, hypothyroid, OSA, and atrial fibrillation who presents for follow up in the [facility]. The patient was initially diagnosed with atrial fibrillation in 2012 and had a DCCV in 2015. Patient is on Eliquis  for a CHADS2VASC score of 3. Patient was in her usual state of health until the evening of 09/07/20 when she noted elevated heart rates and increased fatigue. She took an extra dose of bisoprolol  on 11/20 and 11/21 and converted back to SR. She did have a high salt dinner prior to the onset of her symptoms which she believes triggered her afib.   On follow up 03/24/24, she is currently in Afib. Recently s/p DCCV on 12/23/23. Noted in serial cardiology visits 4/15 and 5/22 to have had ERAF (persistent). Started on diltiazem  120 mg daily. No missed doses of Eliquis . Patient notes to be tired.   Today, she denies symptoms of palpitations, chest pain, shortness of breath, orthopnea, PND, lower extremity edema, dizziness, presyncope, syncope, bleeding, or neurologic sequela. The patient is tolerating medications without difficulties and is otherwise without complaint today.    Atrial Fibrillation Risk Factors:  she does have symptoms or diagnosis of sleep apnea. she is compliant with CPAP therapy. she does not have a history of rheumatic fever. she does not have a history of alcohol use. The patient does have a history of early familial atrial fibrillation or other arrhythmias. Brother had afib.  she has a BMI of Body mass index is 44.02 kg/m.Aaron Aas Filed Weights   03/24/24 1009  Weight: 107.4 kg      Family History  Problem Relation Age of Onset   Atrial fibrillation Mother    Atrial fibrillation Brother      Atrial Fibrillation Management  history:  Previous antiarrhythmic drugs: none Previous cardioversions: 2015, 12/23/23 Previous ablations: none CHADS2VASC score: 3 Anticoagulation history: Eliquis    Past Medical History:  Diagnosis Date   Angio-edema    Atrial fibrillation (HCC) 06/27/2011   Echo - EF 55-60%, LV wall thickness mildly increased; mod/severe LA dilation; estimated pulm arterial systolic pressure elevated at [id number]   Heart murmur    Hyperlipidemia    Hypothyroid    OSA on CPAP 10/25/2012   ahi 1.5   PONV (postoperative nausea and vomiting)    Recurrent upper respiratory infection (URI)    Past Surgical History:  Procedure Laterality Date   CARDIOVERSION N/A 12/23/2023   Procedure: CARDIOVERSION;  Surgeon: Jerryl Morin, DO;  Location: [facility];  Service: Cardiovascular;  Laterality: N/A;   CHOLECYSTECTOMY     TONSILLECTOMY      Current Outpatient Medications  Medication Sig Dispense Refill   apixaban  (ELIQUIS ) 5 MG TABS tablet Take 1 tablet (5 mg total) by mouth 2 (two) times daily. 180 tablet 1   Bacitracin-Polymyxin B (NEOSPORIN EX) Apply 1 Application topically as needed (skin folds).     bisoprolol  (ZEBETA ) 10 MG tablet Take 0.5 tablets (5 mg total) by mouth in the morning and at bedtime. 90 tablet 3   diltiazem  (CARDIZEM  CD) 120 MG 24 hr capsule Take 1 capsule (120 mg total) by mouth daily. 30 capsule 11   diltiazem  (CARDIZEM ) 30 MG tablet Take 1 tablet every 4 hours AS NEEDED for heart rate >100 30 tablet 1   ELDERBERRY PO Take  1 tablet by mouth daily.     furosemide  (LASIX ) 40 MG tablet Take 1 tablet (40 mg total) by mouth daily. 90 tablet 3   hydroxypropyl methylcellulose / hypromellose (ISOPTO TEARS / GONIOVISC) 2.5 % ophthalmic solution Place 1 drop into both eyes in the morning and at bedtime.     levothyroxine (SYNTHROID, LEVOTHROID) 112 MCG tablet Take 112 mcg by mouth See admin instructions. Take 1 tablet (112 mcg) by mouth on Mondays to Saturdays in the morning & skip dose  on Sundays.     Multiple Vitamins-Minerals (MULTIVITAMIN WITH MINERALS) tablet Take 1 tablet by mouth daily after lunch.     Multiple Vitamins-Minerals (ZINC PO) Take 1 tablet by mouth every morning.     vitamin C (ASCORBIC ACID) 500 MG tablet Take 500 mg by mouth daily after lunch.     vitamin E 400 UNIT capsule Take 400 Units by mouth daily after lunch.     Zinc Oxide (DESITIN EX) Apply 1 Application topically as needed (skin folds).     No current facility-administered medications for this encounter.    Allergies  Allergen Reactions   Atorvastatin  Other (See Comments)    Joint/muscle aches   Repatha  [Evolocumab ] Other (See Comments)    Pain in the joints Head Aches Throat pain Pressure from the clavicle and up   Freeze It [Camphor-Menthol] Swelling    Histofreeze   Lidocaine Swelling   ROS- All systems are reviewed and negative except as per the HPI above.  Physical Exam: Vitals:   03/24/24 1009  BP: 122/80  Pulse: (!) 109  Weight: 107.4 kg  Height: 5' 1.5" (1.562 m)    GEN- The patient is well appearing, alert and oriented x 3 today.   Neck - no JVD or carotid bruit noted Lungs- Clear to ausculation bilaterally, normal work of breathing Heart- Irregular rate and rhythm, no murmurs, rubs or gallops, PMI not laterally displaced Extremities- no clubbing, cyanosis, or edema Skin - no rash or ecchymosis noted   Wt Readings from Last 3 Encounters:  03/24/24 107.4 kg  03/10/24 111.7 kg  02/02/24 109.8 kg    EKG today demonstrates  Vent. rate 109 BPM PR interval * ms QRS duration 76 ms QT/QTcB 282/379 ms P-R-T axes * 76 222 Atrial fibrillation with rapid ventricular response ST & T wave abnormality, consider inferior ischemia Abnormal ECG When compared with ECG of 10-Mar-2024 10:58, Criteria for Septal infarct are no longer Present Nonspecific T wave abnormality, worse in Lateral leads  Echo 03/29/20 demonstrated  1. Left ventricular ejection fraction, by  estimation, is 60 to 65%. The  left ventricle has normal function. The left ventricle has no regional  wall motion abnormalities. Left ventricular diastolic parameters were normal. The average left ventricular global longitudinal strain is -23.1 %.   2. Right ventricular systolic function is normal. The right ventricular  size is normal. There is moderately elevated pulmonary artery systolic pressure.   3. Left atrial size was mildly dilated.   4. The mitral valve is normal in structure. Trivial mitral valve  regurgitation. No evidence of mitral stenosis.   5. The aortic valve is normal in structure. Aortic valve regurgitation is not visualized. No aortic stenosis is present.   6. Aortic dilatation noted. There is mild dilatation of the ascending  aorta measuring 38 mm.  Epic records are reviewed at length today  CHA2DS2-VASc Score = 4  The patient's score is based upon: CHF History: 0 HTN History: 1 Diabetes History: 0  Stroke History: 0 Vascular Disease History: 0 Age Score: 2 Gender Score: 1       ASSESSMENT AND PLAN: 1. Persistent Atrial Fibrillation (ICD10:  I48.0) The patient's CHA2DS2-VASc score is 4, indicating a 4.8% annual risk of stroke. S/p DCCV on 12/23/23.   She is currently in Afib. We had a long discussion about medication treatments for rhythm control. We discussed potential options such as Multaq and Tikosyn and amiodarone. We talked about the monitoring required for these medications, hospital admission for Tikosyn, and potential adverse effects. Patient also asked about living in permanent Afib as her sibling does. We talked about rate control strategy. Will increase diltiazem  to 180 mg daily. Patient will consider her decision and likely also take into account repeat echocardiogram results scheduled for 7/2. She will call clinic if wishes to proceed with rhythm control. Her BMI does not make her a good candidate for ablation.    CrCl 75 mL/min from [facility] on  5/22. PR 150 ms Qtc in NSR 420 ms  2. Secondary Hypercoagulable State (ICD10:  D68.69) The patient is at significant risk for stroke/thromboembolism based upon her CHA2DS2-VASc Score of 4.  Continue Apixaban  (Eliquis ).  No missed doses.   3. Obesity Body mass index is 44.02 kg/m. Encouraged to stay active as tolerated.  4. Obstructive sleep apnea Patient reports compliance with CPAP therapy. Followed by Dr Loetta Ringer  5. CAD Coronary calcium  score 141, 68th percentile on CT 10/26/19 No chest pain.   Patient will call with treatment decision.    Woody Heading, PA-C [facility] [address] [place], [place] [address] [phone] 03/24/2024 11:03 AM

## 2024-03-24 NOTE — Addendum Note (Signed)
 Encounter addended by: Missouri Amor, RN on: 03/24/2024 11:43 AM  Actions taken: Order list changed

## 2024-03-29 DIAGNOSIS — E785 Hyperlipidemia, unspecified: Secondary | ICD-10-CM | POA: Diagnosis not present

## 2024-03-31 LAB — NMR, LIPOPROFILE
Cholesterol, Total: 240 mg/dL — ABNORMAL HIGH (ref 100–199)
HDL Particle Number: 34 umol/L (ref >=30.5)
HDL-C: 45 mg/dL (ref >39)
LDL Particle Number: 2170 nmol/L — ABNORMAL HIGH (ref <1000)
LDL Size: 20.3 nm — ABNORMAL LOW (ref >20.5)
LDL-C (NIH Calc): 158 mg/dL — ABNORMAL HIGH (ref 0–99)
LP-IR Score: 73 — ABNORMAL HIGH (ref <=45)
Small LDL Particle Number: 1365 nmol/L — ABNORMAL HIGH (ref <=527)
Triglycerides: 204 mg/dL — ABNORMAL HIGH (ref 0–149)

## 2024-04-04 ENCOUNTER — Telehealth: Payer: Self-pay

## 2024-04-04 DIAGNOSIS — I4819 Other persistent atrial fibrillation: Secondary | ICD-10-CM

## 2024-04-04 DIAGNOSIS — Z79899 Other long term (current) drug therapy: Secondary | ICD-10-CM

## 2024-04-04 DIAGNOSIS — E782 Mixed hyperlipidemia: Secondary | ICD-10-CM

## 2024-04-04 NOTE — Telephone Encounter (Signed)
 Called [facility] to see if we are able to add on the Bmet to patient labs drawn unable to be done. Called patient advised that they will need to head back to lab to get labs drawn again.

## 2024-04-05 ENCOUNTER — Telehealth (HOSPITAL_COMMUNITY): Payer: Self-pay | Admitting: *Deleted

## 2024-04-05 DIAGNOSIS — I4819 Other persistent atrial fibrillation: Secondary | ICD-10-CM | POA: Diagnosis not present

## 2024-04-05 DIAGNOSIS — E782 Mixed hyperlipidemia: Secondary | ICD-10-CM | POA: Diagnosis not present

## 2024-04-05 DIAGNOSIS — Z79899 Other long term (current) drug therapy: Secondary | ICD-10-CM | POA: Diagnosis not present

## 2024-04-05 MED ORDER — DILTIAZEM HCL ER COATED BEADS 240 MG PO CP24: 240.0000 mg | ORAL_CAPSULE | Freq: Every day | ORAL | 3 refills | Status: DC

## 2024-04-05 NOTE — Telephone Encounter (Signed)
 Pt called reporting elevated HR ranges from 80-120 mostly staying above 100 even after taking the PRN Cardizem  for elevated rates. Discussed with Caesar Caster P.A. and plan is to increase daily dose of Cardizem  to 240 mg. Pt notified.

## 2024-04-06 LAB — BASIC METABOLIC PANEL WITH GFR
BUN/Creatinine Ratio: 16 (ref 12–28)
BUN: 15 mg/dL (ref 8–27)
CO2: 27 mmol/L (ref 20–29)
Calcium: 10 mg/dL (ref 8.7–10.3)
Chloride: 97 mmol/L (ref 96–106)
Creatinine, Ser: 0.96 mg/dL (ref 0.57–1.00)
Glucose: 112 mg/dL — ABNORMAL HIGH (ref 70–99)
Potassium: 4.4 mmol/L (ref 3.5–5.2)
Sodium: 141 mmol/L (ref 134–144)
eGFR: 60 mL/min/{1.73_m2} (ref >59)

## 2024-04-07 ENCOUNTER — Other Ambulatory Visit: Payer: Self-pay | Admitting: Cardiovascular Disease

## 2024-04-08 ENCOUNTER — Ambulatory Visit (HOSPITAL_BASED_OUTPATIENT_CLINIC_OR_DEPARTMENT_OTHER): Payer: Self-pay | Admitting: Internal Medicine

## 2024-04-13 NOTE — Telephone Encounter (Signed)
 Left message to call back

## 2024-04-13 NOTE — Telephone Encounter (Signed)
-----   Message from Katlyn D West sent at 04/06/2024  7:59 AM EDT ----- Please let Ms. Fawbush know that her kidney function is normal and her electrolytes are normal. Good results! Continue current medications, proceed with echocardiogram and follow up as planned.  ----- Message ----- From: Rebecka Memos Lab Results In Sent: 04/06/2024   2:36 AM EDT To: Katlyn D West, NP

## 2024-04-13 NOTE — Telephone Encounter (Signed)
 Pt requesting a c/b in regards to results. Please advise

## 2024-04-19 ENCOUNTER — Other Ambulatory Visit: Payer: Self-pay | Admitting: Cardiovascular Disease

## 2024-04-19 DIAGNOSIS — I480 Paroxysmal atrial fibrillation: Secondary | ICD-10-CM

## 2024-04-19 NOTE — Telephone Encounter (Signed)
 Prescription refill request for Eliquis  received. Indication:afib Last office visit:6/25 Scr:0.96  6/25 Age: 78 Weight:107.4  kg  Prescription refilled

## 2024-04-20 ENCOUNTER — Ambulatory Visit (HOSPITAL_COMMUNITY)
Admission: RE | Admit: 2024-04-20 | Discharge: 2024-04-20 | Disposition: A | Discharge status: Home / Self Care | Source: Ambulatory Visit | Attending: Cardiology | Admitting: Cardiology

## 2024-04-20 DIAGNOSIS — G4733 Obstructive sleep apnea (adult) (pediatric): Secondary | ICD-10-CM | POA: Insufficient documentation

## 2024-04-20 DIAGNOSIS — R0602 Shortness of breath: Secondary | ICD-10-CM

## 2024-04-20 DIAGNOSIS — I4819 Other persistent atrial fibrillation: Secondary | ICD-10-CM

## 2024-04-20 LAB — ECHOCARDIOGRAM COMPLETE
Area-P 1/2: 3.65 cm2
MV M vel: 4.46 m/s
MV Peak grad: 79.6 mmHg
S' Lateral: 3.2 cm

## 2024-04-25 NOTE — Progress Notes (Unsigned)
 Cardiology Office Note    Date:  04/26/2024  ID:  Shannon Griffin, DOB [birth year], MRN [id number] PCP:  Shannon Norleen PHEBE PONCE, MD  Cardiologist:  Dorn Lesches, MD  Electrophysiologist:  None   Chief Complaint: Follow up for atrial fibrillation   History of Present Illness: .    Shannon Griffin is a 78 y.o. female with visit-pertinent history of persistent atrial fibrillation, OSA, hyperlipidemia, hypothyroidism.  Patient was initially diagnosed with atrial fibrillation in 2012 and had a DCCV in 2015.  In 11/2018 she had recurrent episode of atrial fibrillation with RVR and was seen in the ED are, she underwent DCCV and was converted to sinus rhythm.  On chart review it appears that patient has had intermittent palpitations felt to possibly be atrial fibrillation.  In 05/2021 she was found to be in atrial fibrillation and DCCV was scheduled however on arrival she was found to be back in sinus rhythm.  Patient was seen by Dr. Lesches on 01/07/2023, patient reported several episodes of breakthrough atrial fibrillation which were isolated.   Patient was seen in clinic on 12/18/2023 for increased shortness of breath.  She was noted to be in atrial fibrillation and set up for cardioversion, she underwent successful cardioversion on 12/23/2023.  She was seen in clinic by Dr. Lesches on 01/05/2024 and remained in sinus rhythm.   On follow-up on 02/02/2024 patient reported increased shortness of breath since the past Friday.  Her EKG indicated she had returned back to atrial fibrillation.  Patient also noted that following her injection today prickly feeling over her skin, and the following day she had severe body aches and pains that have persisted.  Patient was referred to the atrial fibrillation clinic.   Patient was seen by A-fib clinic on 03/24/2024, there was a long discussion regarding medication treatments for rhythm control.  Patient elected to continue thinking about treatment options and reviewing her  echocardiogram.  Her diltiazem  was increased to 180 mg daily.  It was noted that she was not a good candidate for ablation given BMI.  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  Today she presents for follow-up.  She reports that she is doing very well.  She denies any chest pain, reports that her dyspnea on exertion has improved with Lasix , denies any lower extremity edema, orthopnea or PND.  Patient reports CPAP compliance.  Patient reports that she has been considering her options regarding atrial fibrillation, she reports that she is interested in starting on Tikosyn, she plans to follow-up with A-fib clinic regarding next steps.  Patient's echocardiogram was reviewed in office today, all questions were answered.  Patient reports that she is not interested in any further cholesterol medications.  Patient reports that she is feeling well overall and is not interested in changes at this time.  ROS: .   Today she denies chest pain, lower extremity edema, fatigue, palpitations, melena, hematuria, hemoptysis, diaphoresis, weakness, presyncope, syncope, orthopnea, and PND.  All other systems are reviewed and otherwise negative. Studies Reviewed: SABRA   EKG:  EKG is not ordered today.  CV Studies: Cardiac studies reviewed are outlined and summarized above. Otherwise please see EMR for full report. Cardiac Studies &  Procedures   ______________________________________________________________________________________________     ECHOCARDIOGRAM  ECHOCARDIOGRAM COMPLETE 04/20/2024  Narrative ECHOCARDIOGRAM REPORT    Patient Name:   Shannon Griffin Date of Exam: 04/20/2024 Medical Rec #:   [id number]      Height:       61.5 in Accession #:    [id number]     Weight:       236.8 lb Date of Birth:  1946-05-24      BSA:          2.042 m Patient Age:    78 years       BP:           122/80 mmHg Patient Gender: F              HR:           107 bpm. Exam Location:  [address]  Procedure: 2D Echo, Cardiac Doppler, Color Doppler and 3D Echo (Both Spectral and Color Flow Doppler were utilized during procedure).  Indications:    R06.02 SOB  History:        Patient has prior history of Echocardiogram examinations, most recent 03/29/2020. Arrythmias:Atrial Fibrillation, Signs/Symptoms:Murmur; Risk Factors:Dyslipidemia.  Sonographer:    Augustin Seals RDCS Referring Phys: [id number] Ladamien Rammel D Livian Vanderbeck  IMPRESSIONS   1. Left ventricular ejection fraction, by estimation, is 60 to 65%. Left ventricular ejection fraction by 3D volume is 61 %. The left ventricle has normal function. The left ventricle has no regional wall motion abnormalities. Left ventricular diastolic function could not be evaluated. 2. Right ventricular systolic function is normal. The right ventricular size is normal. There is mildly elevated pulmonary artery systolic pressure. The estimated right ventricular systolic pressure is 38.2 mmHg. 3. Left atrial size was mild to moderately dilated. 4. Right atrial size was moderately dilated. 5. The mitral valve is normal in structure. Mild mitral valve regurgitation. No evidence of mitral stenosis. 6. Tricuspid valve regurgitation is mild to moderate. 7. The aortic valve is normal in structure. Aortic valve regurgitation is not visualized. No aortic stenosis is present. 8. Aortic dilatation noted. There is dilatation of the ascending aorta, measuring 38 mm. 9. The inferior vena cava is dilated in size with >50% respiratory variability, suggesting right atrial pressure of 8 mmHg.  FINDINGS Left Ventricle: Left ventricular ejection fraction, by estimation, is 60 to 65%. Left  ventricular ejection fraction by 3D volume is 61 %. The left ventricle has normal function. The left ventricle has no regional wall motion abnormalities. The left ventricular internal cavity size was normal in size. There is no left ventricular hypertrophy. Left ventricular diastolic function could not be evaluated due to atrial fibrillation. Left ventricular diastolic function could not be evaluated.  Right Ventricle: The right ventricular size is normal. No increase in right ventricular wall thickness. Right ventricular systolic function is normal. There is mildly elevated pulmonary artery systolic pressure. The tricuspid regurgitant velocity is 2.75 m/s, and with an assumed right atrial pressure of 8 mmHg, the estimated right ventricular systolic pressure is 38.2 mmHg.  Left Atrium: Left atrial size was mild to moderately dilated.  Right Atrium: Right atrial size was moderately dilated.  Pericardium: There is no evidence of pericardial effusion.  Mitral Valve: The mitral valve is normal in structure. Mild mitral valve regurgitation. No evidence of mitral valve stenosis.  Tricuspid Valve: The tricuspid valve is normal in structure. Tricuspid valve regurgitation is mild to moderate. No evidence of tricuspid stenosis.  Aortic Valve: The aortic valve is normal  in structure. Aortic valve regurgitation is not visualized. No aortic stenosis is present.  Pulmonic Valve: The pulmonic valve was normal in structure. Pulmonic valve regurgitation is mild. No evidence of pulmonic stenosis.  Aorta: Aortic dilatation noted. There is dilatation of the ascending aorta, measuring 38 mm.  Venous: The inferior vena cava is dilated in size with greater than 50% respiratory variability, suggesting right atrial pressure of 8 mmHg.  IAS/Shunts: No atrial level shunt detected by color flow Doppler.  Additional Comments: 3D was performed not requiring image post processing on an independent workstation and was  normal.   LEFT VENTRICLE PLAX 2D LVIDd:         4.60 cm         Diastology LVIDs:         3.20 cm         LV e' medial:    8.85 cm/s LV PW:         1.10 cm         LV E/e' medial:  7.7 LV IVS:        1.10 cm         LV e' lateral:   8.94 cm/s LVOT diam:     2.00 cm         LV E/e' lateral: 7.6 LV SV:         53 LV SV Index:   26 LVOT Area:     3.14 cm        3D Volume EF LV 3D EF:    Left ventricul ar ejection fraction by 3D volume is 61 %.  3D Volume EF: 3D EF:        61 % LV EDV:       86 ml LV ESV:       34 ml LV SV:        52 ml  RIGHT VENTRICLE            IVC RV Basal diam:  4.40 cm    IVC diam: 2.20 cm RV Mid diam:    3.60 cm RV S prime:     9.64 cm/s TAPSE (M-mode): 1.8 cm  LEFT ATRIUM             Index        RIGHT ATRIUM           Index LA diam:        5.60 cm 2.74 cm/m   RA Area:     24.90 cm LA Vol (A2C):   74.0 ml 36.25 ml/m  RA Volume:   76.30 ml  37.37 ml/m LA Vol (A4C):   93.3 ml 45.70 ml/m LA Biplane Vol: 88.3 ml 43.25 ml/m AORTIC VALVE LVOT Vmax:   94.58 cm/s LVOT Vmean:  63.825 cm/s LVOT VTI:    0.169 m  AORTA Ao Root diam: 3.30 cm Ao Asc diam:  4.00 cm  MITRAL VALVE               TRICUSPID VALVE MV Area (PHT): 3.65 cm    TR Peak grad:   30.2 mmHg MV Decel Time: 208 msec    TR Vmax:        275.00 cm/s MR Peak grad: 79.6 mmHg MR Mean grad: 58.0 mmHg    SHUNTS MR Vmax:      446.00 cm/s  Systemic VTI:  0.17 m MR Vmean:     365.0 cm/s   Systemic Diam: 2.00 cm MV E velocity: 67.85 cm/s  MV A velocity: 27.75 cm/s MV E/A ratio:  2.45  Aditya Sabharwal Electronically signed by Ria Commander Signature Date/Time: 04/20/2024/4:59:29 PM    Final      CT SCANS  CT CARDIAC SCORING (SELF PAY ONLY) 10/26/2019  Addendum 10/26/2019  1:39 PM ADDENDUM REPORT: 10/26/2019 13:37  CLINICAL DATA:  Risk stratification  EXAM: Coronary Calcium  Score  TECHNIQUE: The patient was scanned on a [employer] scanner. Axial non-contrast 3 mm  slices were carried out through the heart. The data set was analyzed on a dedicated work station and scored using the Agatson method.  FINDINGS: Non-cardiac: Large hiatal hernia. See separate report from [facility].  Ascending Aorta: Normal caliber.  Mild aortic root atherosclerosis.  Pericardium: Normal.  Coronary arteries: Normal origins.  IMPRESSION: 1. Coronary calcium  score of 141. This was 68th percentile for age and sex matched control.  2. Aortic root atherosclerosis.  Darryle Decent, MD   Electronically Signed By: Darryle Decent On: 10/26/2019 13:37  Narrative EXAM: OVER-READ INTERPRETATION  CT CHEST  The following report is an over-read performed by radiologist Dr. Franky Crease of [facility] on 10/26/2019. This over-read does not include interpretation of cardiac or coronary anatomy or pathology. The coronary calcium  score interpretation by the cardiologist is attached.  COMPARISON:  None.  FINDINGS: Vascular: Heart is normal size. Aorta is normal caliber. Calcifications within the aortic root and visualized descending thoracic aorta.  Mediastinum/Nodes: No adenopathy in the lower mediastinum or hila. Small hiatal hernia.  Lungs/Pleura: Linear scarring in the left base. No confluent opacities or effusions.  Upper Abdomen: Imaging into the upper abdomen shows no acute findings.  Musculoskeletal: Chest wall soft tissues are unremarkable. No acute bony abnormality.  IMPRESSION: No acute extra cardiac abnormality.  Small a are.  Aortic atherosclerosis.  Electronically Signed: By: Franky Crease M.D. On: 10/26/2019 13:20     ______________________________________________________________________________________________       Current Reported Medications:.    Current Meds  Medication Sig   Bacitracin-Polymyxin B (NEOSPORIN EX) Apply 1 Application topically as needed (skin folds).   bisoprolol  (ZEBETA ) 10 MG tablet Take  0.5 tablets (5 mg total) by mouth in the morning and at bedtime.   diltiazem  (CARDIZEM ) 30 MG tablet TAKE ONE TABLET BY MOUTH EVERY 4 HOURS AS NEEDED FOR HEART RATE > 100   ELDERBERRY PO Take 1 tablet by mouth daily.   ELIQUIS  5 MG TABS tablet TAKE ONE TABLET BY MOUTH TWICE A DAY   hydroxypropyl methylcellulose / hypromellose (ISOPTO TEARS / GONIOVISC) 2.5 % ophthalmic solution Place 1 drop into both eyes in the morning and at bedtime.   levothyroxine (SYNTHROID, LEVOTHROID) 112 MCG tablet Take 112 mcg by mouth See admin instructions. Take 1 tablet (112 mcg) by mouth on Mondays to Saturdays in the morning & skip dose on Sundays.   Multiple Vitamins-Minerals (MULTIVITAMIN WITH MINERALS) tablet Take 1 tablet by mouth daily after lunch.   Multiple Vitamins-Minerals (ZINC PO) Take 1 tablet by mouth every morning.   vitamin C (ASCORBIC ACID) 500 MG tablet Take 500 mg by mouth daily after lunch.   vitamin E 400 UNIT capsule Take 400 Units by mouth daily after lunch.   Zinc Oxide (DESITIN EX) Apply 1 Application topically as needed (skin folds).   [DISCONTINUED] diltiazem  (CARDIZEM  CD) 240 MG 24 hr capsule Take 1 capsule (240 mg total) by mouth daily.   [DISCONTINUED] furosemide  (LASIX ) 40 MG tablet Take 1 tablet (40 mg total) by mouth daily.  Physical Exam:    VS:  BP 133/65 (Patient Position: Sitting, Cuff Size: Large)   Pulse 77   Ht 5' 1.5 (1.562 m)   Wt 246 lb 9.6 oz (111.9 kg)   SpO2 98%   BMI 45.84 kg/m    Wt Readings from Last 3 Encounters:  04/26/24 246 lb 9.6 oz (111.9 kg)  03/24/24 236 lb 12.8 oz (107.4 kg)  03/10/24 246 lb 3.2 oz (111.7 kg)    GEN: Well nourished, well developed in no acute distress NECK: No JVD; No carotid bruits CARDIAC: IRIR, no murmurs, rubs, gallops RESPIRATORY:  Clear to auscultation without rales, wheezing or rhonchi  ABDOMEN: Soft, non-tender, non-distended EXTREMITIES:  No edema; No acute deformity     Asessement and Plan:.    Persistent atrial  fibrillation: Patient with DCCV in 2015 and in 2020. Was scheduled for DCCV in 2022 however on arrival was found to back in sinus rhythm.  Patient presented on 12/10/2023 with increased shortness of breath, found to be in atrial fibrillation, underwent successful cardioversion on 12/23/2023, on follow-up with Dr. Court on 3/18 patient remained in sinus rhythm.  Unfortunately on follow-up on 02/01/2024 her EKG indicated she was back in atrial fibrillation with RVR.  Patient was seen in A-fib clinic last month, discussed multiple options including antiarrhythmics, noted that she is not an ideal candidate for ablation given her BMI.  Today patient remains in atrial fibrillation based on auscultation, rate very well-controlled, she reports rate has been controlled at home.  Patient reports that she feels well and her dyspnea on exertion improved.  Her echocardiogram was overall reassuring as noted above.  Patient reports that she is interested on starting on Tikosyn, she will follow-up with A-fib clinic for next steps.  Continue diltiazem , Eliquis , bisoprolol .  Hyperlipidemia: Patient followed by Dr. Mona with lipid clinic.  She was started Repatha  however resulted in significant body aches.  Patient reports she is no longer interested in any cholesterol medications, verbalized understanding of risks involved with elevated cholesterol, patient reports she is not interested in any further management at this time.  OSA: Patient reports nightly CPAP compliance.  Hypertension: Blood pressure today 133/65. Continue bisoprolol  and diltiazem .     Disposition: F/u with Afib clinic, next available appointment. Dr. Court in 5 months.   Signed, Lilliah Priego D Thereasa Iannello, NP

## 2024-04-26 ENCOUNTER — Encounter: Payer: Self-pay | Admitting: Cardiology

## 2024-04-26 ENCOUNTER — Ambulatory Visit: Attending: Cardiology | Admitting: Cardiology

## 2024-04-26 VITALS — BP 133/65 | HR 77 | Ht 61.5 in | Wt 246.6 lb

## 2024-04-26 DIAGNOSIS — I10 Essential (primary) hypertension: Secondary | ICD-10-CM | POA: Insufficient documentation

## 2024-04-26 DIAGNOSIS — G4733 Obstructive sleep apnea (adult) (pediatric): Secondary | ICD-10-CM | POA: Diagnosis not present

## 2024-04-26 DIAGNOSIS — E782 Mixed hyperlipidemia: Secondary | ICD-10-CM | POA: Insufficient documentation

## 2024-04-26 DIAGNOSIS — Z79899 Other long term (current) drug therapy: Secondary | ICD-10-CM | POA: Insufficient documentation

## 2024-04-26 DIAGNOSIS — I4819 Other persistent atrial fibrillation: Secondary | ICD-10-CM | POA: Insufficient documentation

## 2024-04-26 MED ORDER — DILTIAZEM HCL ER COATED BEADS 240 MG PO CP24: 240.0000 mg | ORAL_CAPSULE | Freq: Every day | ORAL | 3 refills | Status: DC

## 2024-04-26 MED ORDER — FUROSEMIDE 40 MG PO TABS: 40.0000 mg | ORAL_TABLET | Freq: Every day | ORAL | 3 refills | Status: DC

## 2024-04-26 NOTE — Patient Instructions (Signed)
 Medication Instructions:  Your physician recommends that you continue on your current medications as directed. Please refer to the Current Medication list given to you today.  *If you need a refill on your cardiac medications before your next appointment, please call your pharmacy*  Lab Work: NONE ordered at this time of appointment   Testing/Procedures: NONE ordered at this time of appointment   Follow-Up: At [facility], you and your health needs are our priority.  As part of our continuing mission to provide you with exceptional heart care, our providers are all part of one team.  This team includes your primary Cardiologist (physician) and Advanced Practice Providers or APPs (Physician Assistants and Nurse Practitioners) who all work together to provide you with the care you need, when you need it.  Your next appointment:   5 months Dr. Court Next Available apt with Afib clinic   Provider:   Dorn Court, MD    We recommend signing up for the patient portal called MyChart.  Sign up information is provided on this After Visit Summary.  MyChart is used to connect with patients for Virtual Visits (Telemedicine).  Patients are able to view lab/test results, encounter notes, upcoming appointments, etc.  Non-urgent messages can be sent to your provider as well.   To learn more about what you can do with MyChart, go to [email or web address].

## 2024-04-27 ENCOUNTER — Telehealth (HOSPITAL_COMMUNITY): Payer: Self-pay | Admitting: *Deleted

## 2024-04-27 ENCOUNTER — Encounter (HOSPITAL_COMMUNITY): Payer: Self-pay

## 2024-04-27 ENCOUNTER — Ambulatory Visit (HOSPITAL_COMMUNITY): Admitting: Internal Medicine

## 2024-04-27 NOTE — Telephone Encounter (Signed)
 Spoke with pt on the phone she wishes to proceed with Tikosyn for treatment plan for Afib. I updated Norleen Heinrich P.A. of her decision he is still in agreement to move forward with plan.

## 2024-04-29 ENCOUNTER — Telehealth: Payer: Self-pay | Admitting: Pharmacist

## 2024-04-29 NOTE — Telephone Encounter (Signed)
 Medication list reviewed in anticipation of upcoming Tikosyn initiation. Patient is not taking any contraindicated but is taking 2 potentially QTc prolonging medications.   Furosemide -May increase QTc prolonging effects due to its effects on electrolytes.  Please ensure potassium is at least 4 and magnesium at least 2 prior to admission and during therapy.  Diltiazem - is a CYP 3 A4 inhibitor which may increase the serum concentration of dofetilide.  Monitor for increased risk of toxicities including QTc interval.  No changes are needed  Patient is anticoagulated on Eliquis  on the appropriate dose. Please ensure that patient has not missed any anticoagulation doses in the 3 weeks prior to Tikosyn initiation.   Patient will need to be counseled to avoid use of Benadryl while on Tikosyn and in the 2-3 days prior to Tikosyn initiation.

## 2024-05-02 ENCOUNTER — Telehealth: Payer: Self-pay | Admitting: Cardiology

## 2024-05-02 NOTE — Telephone Encounter (Signed)
 Pt called in stating her CPAP says motor life exceeded. Please advise what this means and what she needs to do. Last seen Dr. Burnard 05/14/21.

## 2024-05-05 ENCOUNTER — Ambulatory Visit (INDEPENDENT_AMBULATORY_CARE_PROVIDER_SITE_OTHER): Admitting: Family Medicine

## 2024-05-05 ENCOUNTER — Encounter (HOSPITAL_BASED_OUTPATIENT_CLINIC_OR_DEPARTMENT_OTHER): Payer: Self-pay | Admitting: Family Medicine

## 2024-05-05 VITALS — BP 126/80 | HR 82 | Temp 98.6°F | Resp 18 | Ht 61.42 in | Wt 247.3 lb

## 2024-05-05 DIAGNOSIS — E039 Hypothyroidism, unspecified: Secondary | ICD-10-CM | POA: Diagnosis not present

## 2024-05-05 DIAGNOSIS — I10 Essential (primary) hypertension: Secondary | ICD-10-CM | POA: Diagnosis not present

## 2024-05-05 DIAGNOSIS — M1991 Primary osteoarthritis, unspecified site: Secondary | ICD-10-CM | POA: Diagnosis not present

## 2024-05-05 DIAGNOSIS — M1990 Unspecified osteoarthritis, unspecified site: Secondary | ICD-10-CM | POA: Insufficient documentation

## 2024-05-05 NOTE — Progress Notes (Signed)
 Established Patient Office Visit  Subjective   Patient ID: Shannon Griffin, female    DOB: 08-May-1946  Age: 78 y.o. MRN: [id number]  Chief Complaint  Patient presents with   Establish Care    Establish care    joint pain    Joint pain    F/u as above.  New to my practice here, though known to me from [facility].  She has struggled with her Afib and weight control.  Extended discussion about her numerous concerns.    Past Medical History:  Diagnosis Date   Angio-edema    Atrial fibrillation (HCC) 06/27/2011   f/by Dr. Court of [facility]   Colonoscopy refused    Hyperlipidemia    Hypothyroid    Mammogram declined    Morbid obesity (HCC)    Multiple thyroid  nodules    known to Endocrine with previous biopsies   OSA on CPAP 10/25/2012   Osteoarthritis     Outpatient Encounter Medications as of 05/05/2024  Medication Sig   Bacitracin-Polymyxin B (NEOSPORIN EX) Apply 1 Application topically as needed (skin folds).   bisoprolol  (ZEBETA ) 10 MG tablet Take 0.5 tablets (5 mg total) by mouth in the morning and at bedtime.   diltiazem  (CARDIZEM  CD) 240 MG 24 hr capsule Take 1 capsule (240 mg total) by mouth daily.   diltiazem  (CARDIZEM ) 30 MG tablet TAKE ONE TABLET BY MOUTH EVERY 4 HOURS AS NEEDED FOR HEART RATE > 100   ELDERBERRY PO Take 1 tablet by mouth daily.   ELIQUIS  5 MG TABS tablet TAKE ONE TABLET BY MOUTH TWICE A DAY   furosemide  (LASIX ) 40 MG tablet Take 1 tablet (40 mg total) by mouth daily.   hydroxypropyl methylcellulose / hypromellose (ISOPTO TEARS / GONIOVISC) 2.5 % ophthalmic solution Place 1 drop into both eyes in the morning and at bedtime.   levothyroxine (SYNTHROID, LEVOTHROID) 112 MCG tablet Take 112 mcg by mouth See admin instructions. Take 1 tablet (112 mcg) by mouth on Mondays to Saturdays in the morning & skip dose on Sundays.   Multiple Vitamins-Minerals (MULTIVITAMIN WITH MINERALS) tablet Take 1 tablet by mouth daily after lunch.   [DISCONTINUED] Multiple  Vitamins-Minerals (ZINC PO) Take 1 tablet by mouth every morning.   [DISCONTINUED] vitamin C (ASCORBIC ACID) 500 MG tablet Take 500 mg by mouth daily after lunch.   [DISCONTINUED] vitamin E 400 UNIT capsule Take 400 Units by mouth daily after lunch.   [DISCONTINUED] Zinc Oxide (DESITIN EX) Apply 1 Application topically as needed (skin folds).   No facility-administered encounter medications on file as of 05/05/2024.    Social History   Tobacco Use   Smoking status: Never   Smokeless tobacco: Never  Substance Use Topics   Alcohol use: No   Drug use: No      Review of Systems  Constitutional:  Positive for malaise/fatigue. Negative for diaphoresis, fever and weight loss.  Respiratory:  Negative for cough, shortness of breath and wheezing.   Cardiovascular:  Negative for chest pain, palpitations, orthopnea, claudication, leg swelling and PND.      Objective:     BP 126/80 (BP Location: Right Arm, Patient Position: Standing, Cuff Size: Normal)   Pulse 82   Temp 98.6 F (37 C) (Oral)   Resp 18   Ht 5' 1.42 (1.56 m)   Wt 247 lb 4.8 oz (112.2 kg)   BMI 46.09 kg/m    Physical Exam Constitutional:      General: She is not in acute distress.  Appearance: Normal appearance. She is obese.  HENT:     Head: Normocephalic.  Neck:     Vascular: No carotid bruit.  Cardiovascular:     Rate and Rhythm: Normal rate. Rhythm irregular.     Pulses: Normal pulses.     Heart sounds: Normal heart sounds.  Pulmonary:     Effort: Pulmonary effort is normal.     Breath sounds: Normal breath sounds.  Abdominal:     General: Bowel sounds are normal.     Palpations: Abdomen is soft.  Musculoskeletal:     Cervical back: Neck supple. No tenderness.     Right lower leg: Edema present.     Left lower leg: Edema present.     Comments: Bilateral 1+ edema  Neurological:     Mental Status: She is alert.      No results found for any visits on 05/05/24.    The 10-year ASCVD risk  score (Arnett DK, et al., 2019) is: 29.5%    Assessment & Plan:  Acquired hypothyroidism Assessment & Plan: Request and review old records from [place] and [place].  No labs today.  She declines a Kenalog injection to either knee today.   Essential hypertension  Morbid obesity (HCC)  Primary osteoarthritis, unspecified site    Return in about 4 weeks (around 06/02/2024) for chronic follow-up.    REDDING PONCE NORLEEN FALCON., MD

## 2024-05-05 NOTE — Assessment & Plan Note (Signed)
 Request and review old records from [place] and [place].  No labs today.  She declines a Kenalog injection to either knee today.

## 2024-05-05 NOTE — Telephone Encounter (Signed)
 Reached out to patient and got her an appointment with the sleep doctor to get a new Rx. Patient understands his AHI showed normal. Pt is aware and agreeable to treatment.

## 2024-05-11 DIAGNOSIS — E038 Other specified hypothyroidism: Secondary | ICD-10-CM | POA: Diagnosis not present

## 2024-05-12 ENCOUNTER — Telehealth (HOSPITAL_COMMUNITY): Payer: Self-pay | Admitting: *Deleted

## 2024-05-12 NOTE — Telephone Encounter (Signed)
 [id number] no preauthorization required for tikosyn admission

## 2024-05-13 DIAGNOSIS — E038 Other specified hypothyroidism: Secondary | ICD-10-CM | POA: Diagnosis not present

## 2024-05-13 DIAGNOSIS — E042 Nontoxic multinodular goiter: Secondary | ICD-10-CM | POA: Diagnosis not present

## 2024-05-17 ENCOUNTER — Telehealth (HOSPITAL_COMMUNITY): Payer: Self-pay

## 2024-05-17 NOTE — Telephone Encounter (Signed)
 Patient wanted me to call and verify to make sure her in patient stay did not require authorization. Contacted [employer] [facility] [place]  to initiate authorization for Tikosyn admission. Date of service: 05/24/2024 Per [employer] in patient stay does not require pre-certification.  Contacted patient to let her know Tikosyn admission does not require pre-certification.  Reference # [id number] Phone number for [employer] [place] - [phone]

## 2024-05-20 ENCOUNTER — Encounter (HOSPITAL_COMMUNITY): Payer: Self-pay

## 2024-05-20 ENCOUNTER — Ambulatory Visit: Attending: Cardiology | Admitting: Cardiology

## 2024-05-20 VITALS — BP 129/90 | HR 83 | Ht 61.0 in | Wt 240.0 lb

## 2024-05-20 DIAGNOSIS — I10 Essential (primary) hypertension: Secondary | ICD-10-CM | POA: Diagnosis not present

## 2024-05-20 DIAGNOSIS — G4733 Obstructive sleep apnea (adult) (pediatric): Secondary | ICD-10-CM | POA: Diagnosis not present

## 2024-05-20 NOTE — Progress Notes (Signed)
 SLEEP MEDICINE VIRTUAL CONSULT NOTE via Video Note   Because of Shannon Griffin's co-morbid illnesses, she is at least at moderate risk for complications without adequate follow up.  This format is felt to be most appropriate for this patient at this time.  All issues noted in this document were discussed and addressed.  A limited physical exam was performed with this format.  Please refer to the patient's chart for her consent to telehealth for [facility].      Date:  05/20/2024   ID:  Shannon Griffin, DOB 05-17-1946, MRN [id number] The patient was identified using 2 identifiers.  Patient Location: Home Provider Location: Home Office   PCP:  Dottie Norleen PHEBE PONCE, MD   [facility] Providers Cardiologist:  Dorn Lesches, MD     Evaluation Performed:  New Patient Evaluation  Chief Complaint:  OSA  History of Present Illness:    Shannon Griffin is a 78 y.o. female who is being seen today for the evaluation of OSA at the request of Dorn Lesches, MD.  Shannon Griffin is a 78 y.o. female with a history of atrial fibrillation, hyperlipidemia and obstructive sleep apnea.  The patient was initially diagnosed with obstructive sleep apnea in 2013 with a sleep study showing AHI of 6.2/h and moderate during REM sleep with a REM AHI of 23/h.  CPAP was initiated at 9 cm H2O.  She received a new ResMed AirSense 10 CPAP unit in April 2019 and DME company is [employer] [facility] in [place].  She had been followed by Dr. Burnard but then was lost to follow-up and is now referred back for sleep medicine consultation to reestablish sleep care.  She is doing well with her PAP device and thinks that she has gotten used to it.  She tolerates the nasal pillow mask and feels the pressure is adequate.  Since going on PAP she feels rested in the am and has no significant daytime sleepiness usually but recently she has been having more problems with afib and has felt tired and will lay down  to rest..  She says that she sleeps very well at night and gets up to go to the bathroom some nights 1 time. She denies any significant nasal dryness or nasal congestion.  She has a lot or problems with mouth dryness and does not use a chin strap. She does not think that she snores.   She tells me that her device is coming up with a waring stating that her motor is end of life.  Past Medical History:  Diagnosis Date   Angio-edema    Atrial fibrillation (HCC) 06/27/2011   f/by Dr. Lesches of [facility]   Colonoscopy refused    Hyperlipidemia    Hypothyroid    Mammogram declined    Morbid obesity (HCC)    Multiple thyroid  nodules    known to Endocrine with previous biopsies   OSA on CPAP 10/25/2012   Osteoarthritis    Past Surgical History:  Procedure Laterality Date   CARDIOVERSION N/A 12/23/2023   Procedure: CARDIOVERSION;  Surgeon: Sheena Pugh, DO;  Location: [facility] IN[facility];  Service: Cardiovascular;  Laterality: N/A;   CHOLECYSTECTOMY     TONSILLECTOMY       Current Meds  Medication Sig   Bacitracin-Polymyxin B (NEOSPORIN EX) Apply 1 Application topically as needed (skin folds).   bisoprolol  (ZEBETA ) 10 MG tablet Take 0.5 tablets (5 mg total) by mouth in the morning and at  bedtime.   diltiazem  (CARDIZEM  CD) 240 MG 24 hr capsule Take 1 capsule (240 mg total) by mouth daily.   diltiazem  (CARDIZEM ) 30 MG tablet TAKE ONE TABLET BY MOUTH EVERY 4 HOURS AS NEEDED FOR HEART RATE > 100   ELDERBERRY PO Take 1 tablet by mouth daily.   ELIQUIS  5 MG TABS tablet TAKE ONE TABLET BY MOUTH TWICE A DAY   furosemide  (LASIX ) 40 MG tablet Take 1 tablet (40 mg total) by mouth daily.   hydroxypropyl methylcellulose / hypromellose (ISOPTO TEARS / GONIOVISC) 2.5 % ophthalmic solution Place 1 drop into both eyes in the morning and at bedtime.   levothyroxine (SYNTHROID, LEVOTHROID) 112 MCG tablet Take 112 mcg by mouth See admin instructions. Take 1 tablet (112 mcg) by mouth on Mondays to Saturdays in the  morning & skip dose on Sundays.   Multiple Vitamins-Minerals (MULTIVITAMIN WITH MINERALS) tablet Take 1 tablet by mouth daily after lunch.     Allergies:   Atorvastatin , Repatha  [evolocumab ], Freeze it [camphor-menthol], and Lidocaine   Social History   Tobacco Use   Smoking status: Never   Smokeless tobacco: Never  Substance Use Topics   Alcohol use: No   Drug use: No     Family Hx: The patient's family history includes Atrial fibrillation in her brother and mother.  ROS:   Please see the history of present illness.     All other systems reviewed and are negative.   Prior Sleep studies:   The following studies were reviewed today:  PAP compliance download  Labs/Other Tests and Data Reviewed:     Recent Labs: 12/18/2023: TSH 4.150 02/02/2024: Hemoglobin 14.8; Magnesium 2.2; Platelets 198 03/10/2024: ALT 12; BNP 567.4 04/05/2024: BUN 15; Creatinine, Ser 0.96; Potassium 4.4; Sodium 141    Wt Readings from Last 3 Encounters:  05/20/24 240 lb (108.9 kg)  05/05/24 247 lb 4.8 oz (112.2 kg)  04/26/24 246 lb 9.6 oz (111.9 kg)     Risk Assessment/Calculations:    CHA2DS2-VASc Score = 4   This indicates a 4.8% annual risk of stroke. The patient's score is based upon: CHF History: 0 HTN History: 1 Diabetes History: 0 Stroke History: 0 Vascular Disease History: 0 Age Score: 2 Gender Score: 1         Objective:    Vital Signs:  BP (!) 129/90   Pulse 83   Ht 5' 1 (1.549 m)   Wt 240 lb (108.9 kg)   SpO2 94%   BMI 45.35 kg/m    VITAL SIGNS:  reviewed GEN:  no acute distress EYES:  sclerae anicteric, EOMI - Extraocular Movements Intact RESPIRATORY:  normal respiratory effort, symmetric expansion CARDIOVASCULAR:  no peripheral edema SKIN:  no rash, lesions or ulcers. MUSCULOSKELETAL:  no obvious deformities. NEURO:  alert and oriented x 3, no obvious focal deficit PSYCH:  normal affect  ASSESSMENT & PLAN:    OSA - The patient is tolerating PAP therapy  well without any problems. The PAP download performed by his DME was personally reviewed and interpreted by me today and showed an AHI of 1.5/hr on 9 cm H2O with 100% compliance in using more than 4 hours nightly.  The patient has been using and benefiting from PAP use and will continue to benefit from therapy.  -She tells me that her device is coming up with a waring stating that her motor is end of life. -I will order her a new ResMed CPAP at 9cm H2O with heated humidity and mask of  choice -I will order her a chin strap for mouth dryness  HTN -BP controlled on exam today -continue Cardizem  CD 240mg  daily, Bisoprolol  5mg  daily with PRN refills   Time:   Today, I have spent 15 minutes with the patient with telehealth technology discussing the above problems.     Medication Adjustments/Labs and Tests Ordered: Current medicines are reviewed at length with the patient today.  Concerns regarding medicines are outlined above.   Tests Ordered: No orders of the defined types were placed in this encounter.   Medication Changes: No orders of the defined types were placed in this encounter.   Follow Up:  virtual visit in 6 weeks after she gets her new device  Signed, Wilbert Bihari, MD  05/20/2024 8:23 AM    [facility]

## 2024-05-20 NOTE — Addendum Note (Signed)
 Addended by: SHLOMO WILBERT SAUNDERS on: 05/20/2024 08:57 AM   Modules accepted: Level of Service

## 2024-05-24 ENCOUNTER — Other Ambulatory Visit (HOSPITAL_COMMUNITY): Payer: Self-pay

## 2024-05-24 ENCOUNTER — Ambulatory Visit (HOSPITAL_COMMUNITY)
Admission: RE | Admit: 2024-05-24 | Discharge: 2024-05-24 | Disposition: A | Discharge status: Home / Self Care | Source: Ambulatory Visit | Attending: Internal Medicine | Admitting: Internal Medicine

## 2024-05-24 ENCOUNTER — Telehealth (HOSPITAL_COMMUNITY): Payer: Self-pay | Admitting: Pharmacy Technician

## 2024-05-24 ENCOUNTER — Encounter (HOSPITAL_COMMUNITY): Payer: Self-pay | Admitting: Cardiology

## 2024-05-24 ENCOUNTER — Inpatient Hospital Stay (HOSPITAL_COMMUNITY)
Admission: RE | Admit: 2024-05-24 | Discharge: 2024-05-27 | DRG: 309 | Disposition: A | Discharge status: Home / Self Care | Source: Ambulatory Visit | Attending: Cardiology | Admitting: Cardiology

## 2024-05-24 ENCOUNTER — Ambulatory Visit (HOSPITAL_COMMUNITY): Payer: Self-pay | Admitting: Internal Medicine

## 2024-05-24 ENCOUNTER — Other Ambulatory Visit: Payer: Self-pay

## 2024-05-24 VITALS — BP 136/76 | HR 119 | Ht 61.0 in | Wt 246.6 lb

## 2024-05-24 DIAGNOSIS — Z888 Allergy status to other drugs, medicaments and biological substances status: Secondary | ICD-10-CM

## 2024-05-24 DIAGNOSIS — I4819 Other persistent atrial fibrillation: Secondary | ICD-10-CM | POA: Diagnosis present

## 2024-05-24 DIAGNOSIS — Z886 Allergy status to analgesic agent status: Secondary | ICD-10-CM | POA: Diagnosis not present

## 2024-05-24 DIAGNOSIS — Z7901 Long term (current) use of anticoagulants: Secondary | ICD-10-CM | POA: Insufficient documentation

## 2024-05-24 DIAGNOSIS — G4733 Obstructive sleep apnea (adult) (pediatric): Secondary | ICD-10-CM | POA: Diagnosis present

## 2024-05-24 DIAGNOSIS — E785 Hyperlipidemia, unspecified: Secondary | ICD-10-CM | POA: Diagnosis present

## 2024-05-24 DIAGNOSIS — E039 Hypothyroidism, unspecified: Secondary | ICD-10-CM | POA: Insufficient documentation

## 2024-05-24 DIAGNOSIS — I2510 Atherosclerotic heart disease of native coronary artery without angina pectoris: Secondary | ICD-10-CM | POA: Diagnosis present

## 2024-05-24 DIAGNOSIS — D6869 Other thrombophilia: Secondary | ICD-10-CM | POA: Insufficient documentation

## 2024-05-24 DIAGNOSIS — I4891 Unspecified atrial fibrillation: Secondary | ICD-10-CM

## 2024-05-24 DIAGNOSIS — Z6841 Body Mass Index (BMI) 40.0 and over, adult: Secondary | ICD-10-CM

## 2024-05-24 DIAGNOSIS — Z79890 Hormone replacement therapy (postmenopausal): Secondary | ICD-10-CM | POA: Diagnosis not present

## 2024-05-24 DIAGNOSIS — I10 Essential (primary) hypertension: Secondary | ICD-10-CM | POA: Diagnosis present

## 2024-05-24 DIAGNOSIS — Z79899 Other long term (current) drug therapy: Secondary | ICD-10-CM | POA: Insufficient documentation

## 2024-05-24 LAB — BASIC METABOLIC PANEL WITH GFR
BUN/Creatinine Ratio: 17 (ref 12–28)
BUN: 17 mg/dL (ref 8–27)
CO2: 31 mmol/L — ABNORMAL HIGH (ref 20–29)
Calcium: 9.7 mg/dL (ref 8.7–10.3)
Chloride: 100 mmol/L (ref 96–106)
Creatinine, Ser: 0.98 mg/dL (ref 0.57–1.00)
Glucose: 115 mg/dL — ABNORMAL HIGH (ref 70–99)
Potassium: 4.1 mmol/L (ref 3.5–5.2)
Sodium: 144 mmol/L (ref 134–144)
eGFR: 59 mL/min/1.73 — ABNORMAL LOW (ref >59)

## 2024-05-24 LAB — MAGNESIUM: Magnesium: 2 mg/dL (ref 1.6–2.3)

## 2024-05-24 MED ORDER — FUROSEMIDE 40 MG PO TABS
40.0000 mg | ORAL_TABLET | Freq: Every day | ORAL | Status: DC
  Administered 2024-05-25 – 2024-05-27 (×3): 40 mg via ORAL
  Filled 2024-05-24 (×3): qty 1

## 2024-05-24 MED ORDER — DILTIAZEM HCL ER COATED BEADS 240 MG PO CP24
240.0000 mg | ORAL_CAPSULE | Freq: Every day | ORAL | Status: DC
  Administered 2024-05-25: 240 mg via ORAL
  Filled 2024-05-24: qty 1

## 2024-05-24 MED ORDER — DOFETILIDE 500 MCG PO CAPS
500.0000 ug | ORAL_CAPSULE | Freq: Two times a day (BID) | ORAL | Status: DC
  Administered 2024-05-24 – 2024-05-25 (×2): 500 ug via ORAL
  Filled 2024-05-24 (×2): qty 1

## 2024-05-24 MED ORDER — MAGNESIUM SULFATE 2 GM/50ML IV SOLN
2.0000 g | Freq: Once | INTRAVENOUS | Status: AC
  Administered 2024-05-24: 2 g via INTRAVENOUS
  Filled 2024-05-24 (×2): qty 50

## 2024-05-24 MED ORDER — SODIUM CHLORIDE 0.9 % IV SOLN: 250.0000 mL | INTRAVENOUS | Status: AC | PRN

## 2024-05-24 MED ORDER — APIXABAN 5 MG PO TABS
5.0000 mg | ORAL_TABLET | Freq: Two times a day (BID) | ORAL | Status: DC
  Administered 2024-05-24 – 2024-05-27 (×6): 5 mg via ORAL
  Filled 2024-05-24 (×6): qty 1

## 2024-05-24 MED ORDER — LEVOTHYROXINE SODIUM 112 MCG PO TABS: 112.0000 ug | ORAL_TABLET | Freq: Every day | ORAL | Status: DC

## 2024-05-24 MED ORDER — LEVOTHYROXINE SODIUM 112 MCG PO TABS
112.0000 ug | ORAL_TABLET | Freq: Every day | ORAL | Status: DC
  Administered 2024-05-25 – 2024-05-27 (×3): 112 ug via ORAL
  Filled 2024-05-24 (×6): qty 1

## 2024-05-24 MED ORDER — SODIUM CHLORIDE 0.9% FLUSH
3.0000 mL | INTRAVENOUS | Status: DC | PRN
Start: 2024-05-24 — End: 2024-05-27

## 2024-05-24 MED ORDER — BISOPROLOL FUMARATE 5 MG PO TABS
5.0000 mg | ORAL_TABLET | Freq: Two times a day (BID) | ORAL | Status: DC
  Administered 2024-05-24 – 2024-05-25 (×3): 5 mg via ORAL
  Filled 2024-05-24 (×3): qty 1

## 2024-05-24 MED ORDER — SODIUM CHLORIDE 0.9% FLUSH
3.0000 mL | Freq: Two times a day (BID) | INTRAVENOUS | Status: DC
  Administered 2024-05-24 – 2024-05-27 (×4): 3 mL via INTRAVENOUS

## 2024-05-24 NOTE — Progress Notes (Signed)
 Primary Care Physician: Dottie Norleen PHEBE PONCE, MD Primary Cardiologist: Dr Court Primary Electrophysiologist: none Referring Physician: [facility]   Shannon Griffin is a 78 y.o. female with a history of HLD, HTN, hypothyroid, OSA, and atrial fibrillation who presents for follow up in the [facility]. The patient was initially diagnosed with atrial fibrillation in 2012 and had a DCCV in 2015. Patient is on Eliquis  for a CHADS2VASC score of 3. Patient was in her usual state of health until the evening of 09/07/20 when she noted elevated heart rates and increased fatigue. She took an extra dose of bisoprolol  on 11/20 and 11/21 and converted back to SR. She did have a high salt dinner prior to the onset of her symptoms which she believes triggered her afib.   On follow up 03/24/24, she is currently in Afib. Recently s/p DCCV on 12/23/23. Noted in serial cardiology visits 4/15 and 5/22 to have had ERAF (persistent). Started on diltiazem  120 mg daily. No missed doses of Eliquis . Patient notes to be tired.   On follow up 05/24/24, patient is here for Tikosyn  admission. No new medications since last OV. No benadryl use. No missed doses of anticoagulant.   Today, she denies symptoms of palpitations, chest pain, shortness of breath, orthopnea, PND, lower extremity edema, dizziness, presyncope, syncope, bleeding, or neurologic sequela. The patient is tolerating medications without difficulties and is otherwise without complaint today.    Atrial Fibrillation Risk Factors:  she does have symptoms or diagnosis of sleep apnea. she is compliant with CPAP therapy. she does not have a history of rheumatic fever. she does not have a history of alcohol use. The patient does have a history of early familial atrial fibrillation or other arrhythmias. Brother had afib.  she has a BMI of Body mass index is 46.59 kg/m.Shannon Griffin Filed Weights   05/24/24 0826  Weight: 111.9 kg       Family  History  Problem Relation Age of Onset   Atrial fibrillation Mother    Atrial fibrillation Brother      Atrial Fibrillation Management history:  Previous antiarrhythmic drugs: none Previous cardioversions: 2015, 12/23/23 Previous ablations: none CHADS2VASC score: 3 Anticoagulation history: Eliquis    Past Medical History:  Diagnosis Date   Angio-edema    Atrial fibrillation (HCC) 06/27/2011   f/by Dr. Court of [facility]   Colonoscopy refused    Hyperlipidemia    Hypothyroid    Mammogram declined    Morbid obesity (HCC)    Multiple thyroid  nodules    known to Endocrine with previous biopsies   OSA on CPAP 10/25/2012   Osteoarthritis    Past Surgical History:  Procedure Laterality Date   CARDIOVERSION N/A 12/23/2023   Procedure: CARDIOVERSION;  Surgeon: Sheena Pugh, DO;  Location: [facility] INVAS[facility] CV LAB;  Service: Cardiovascular;  Laterality: N/A;   CHOLECYSTECTOMY     TONSILLECTOMY      Current Outpatient Medications  Medication Sig Dispense Refill   Bacitracin-Polymyxin B (NEOSPORIN EX) Apply 1 Application topically as needed (skin folds).     bisoprolol  (ZEBETA ) 10 MG tablet Take 0.5 tablets (5 mg total) by mouth in the morning and at bedtime. 90 tablet 3   diltiazem  (CARDIZEM  CD) 240 MG 24 hr capsule Take 1 capsule (240 mg total) by mouth daily. 90 capsule 3   diltiazem  (CARDIZEM ) 30 MG tablet TAKE ONE TABLET BY MOUTH EVERY 4 HOURS AS NEEDED FOR HEART RATE > 100 30 tablet 1   ELDERBERRY  PO Take 1 tablet by mouth daily.     ELIQUIS  5 MG TABS tablet TAKE ONE TABLET BY MOUTH TWICE A DAY 180 tablet 1   furosemide  (LASIX ) 40 MG tablet Take 1 tablet (40 mg total) by mouth daily. 90 tablet 3   hydroxypropyl methylcellulose / hypromellose (ISOPTO TEARS / GONIOVISC) 2.5 % ophthalmic solution Place 1 drop into both eyes in the morning and at bedtime.     levothyroxine  (SYNTHROID , LEVOTHROID) 112 MCG tablet Take 112 mcg by mouth See admin instructions. Take 1 tablet (112 mcg) by  mouth on Mondays to Saturdays in the morning & skip dose on Sundays.     Multiple Vitamins-Minerals (MULTIVITAMIN WITH MINERALS) tablet Take 1 tablet by mouth daily after lunch.     No current facility-administered medications for this encounter.    Allergies  Allergen Reactions   Atorvastatin  Other (See Comments)    Joint/muscle aches   Repatha  [Evolocumab ] Other (See Comments)    Pain in the joints Head Aches Throat pain Pressure from the clavicle and up   Freeze It [Camphor-Menthol] Swelling    Histofreeze   Lidocaine Swelling   ROS- All systems are reviewed and negative except as per the HPI above.  Physical Exam: Vitals:   05/24/24 0826  BP: 136/76  Pulse: (!) 119  Weight: 111.9 kg  Height: 5' 1 (1.549 m)    GEN- The patient is well appearing, alert and oriented x 3 today.   Neck - no JVD or carotid bruit noted Lungs- Clear to ausculation bilaterally, normal work of breathing Heart- Irregular tachycardic rate and rhythm, no murmurs, rubs or gallops, PMI not laterally displaced Extremities- no clubbing, cyanosis, or edema Skin - no rash or ecchymosis noted   Wt Readings from Last 3 Encounters:  05/24/24 111.9 kg  05/20/24 108.9 kg  05/05/24 112.2 kg    EKG today demonstrates  Vent. rate 119 BPM PR interval * ms QRS duration 76 ms QT/QTcB 260/365 ms P-R-T axes * 62 228 Atrial fibrillation with rapid ventricular response with premature ventricular or aberrantly conducted complexes Low voltage QRS ST & T wave abnormality, consider inferior ischemia Abnormal ECG When compared with ECG of 24-Mar-2024 10:24, No significant change was found  Echo 03/29/20 demonstrated  1. Left ventricular ejection fraction, by estimation, is 60 to 65%. The  left ventricle has normal function. The left ventricle has no regional  wall motion abnormalities. Left ventricular diastolic parameters were normal. The average left ventricular global longitudinal strain is -23.1 %.   2.  Right ventricular systolic function is normal. The right ventricular  size is normal. There is moderately elevated pulmonary artery systolic pressure.   3. Left atrial size was mildly dilated.   4. The mitral valve is normal in structure. Trivial mitral valve  regurgitation. No evidence of mitral stenosis.   5. The aortic valve is normal in structure. Aortic valve regurgitation is not visualized. No aortic stenosis is present.   6. Aortic dilatation noted. There is mild dilatation of the ascending  aorta measuring 38 mm.  Epic records are reviewed at length today  CHA2DS2-VASc Score = 4  The patient's score is based upon: CHF History: 0 HTN History: 1 Diabetes History: 0 Stroke History: 0 Vascular Disease History: 0 Age Score: 2 Gender Score: 1       ASSESSMENT AND PLAN: 1. Persistent Atrial Fibrillation (ICD10:  I48.0) The patient's CHA2DS2-VASc score is 4, indicating a 4.8% annual risk of stroke. S/p DCCV on 12/23/23.  Patient would like to presents for dofetilide  admission. Continue Eliquis  5 mg BID, states no missed doses in the last 3 weeks. No recent benadryl use PharmD has screened medications QTc in SR 420 ms Labs today pending.   CrCl 75 mL/min from [facility] on 5/22. PR 150 ms Qtc in NSR 420 ms  2. Secondary Hypercoagulable State (ICD10:  D68.69) The patient is at significant risk for stroke/thromboembolism based upon her CHA2DS2-VASc Score of 4.  Continue Apixaban  (Eliquis ).  No missed doses.   3. Obesity Body mass index is 46.59 kg/m. Encouraged to stay active as tolerated.  4. Obstructive sleep apnea Patient reports compliance with CPAP therapy. Followed by Dr Burnard  5. CAD Coronary calcium  score 141, 68th percentile on CT 10/26/19 No chest pain.    Patient will present to admissions when a bed is available.    Dorn Heinrich, PA-C Afib Clinic [facility] [address] [place], [place] [address] [phone] 05/24/2024 10:00 AM

## 2024-05-24 NOTE — Plan of Care (Signed)
  Problem: Health Behavior/Discharge Planning: Goal: Ability to manage health-related needs will improve Outcome: Progressing   Problem: Clinical Measurements: Goal: Ability to maintain clinical measurements within normal limits will improve Outcome: Progressing Goal: Will remain free from infection Outcome: Progressing Goal: Diagnostic test results will improve Outcome: Progressing Goal: Respiratory complications will improve Outcome: Progressing Goal: Cardiovascular complication will be avoided Outcome: Progressing   Problem: Activity: Goal: Risk for activity intolerance will decrease Outcome: Progressing   Problem: Nutrition: Goal: Adequate nutrition will be maintained Outcome: Progressing   Problem: Coping: Goal: Level of anxiety will decrease Outcome: Progressing   Problem: Elimination: Goal: Will not experience complications related to urinary retention Outcome: Progressing   Problem: Pain Managment: Goal: General experience of comfort will improve and/or be controlled Outcome: Progressing   Problem: Skin Integrity: Goal: Risk for impaired skin integrity will decrease Outcome: Progressing   Problem: Education: Goal: Knowledge of disease or condition will improve Outcome: Progressing   Problem: Activity: Goal: Ability to tolerate increased activity will improve Outcome: Progressing

## 2024-05-24 NOTE — Progress Notes (Signed)
 Pharmacy: Dofetilide  (Tikosyn ) - Initial Consult Assessment and Electrolyte Replacement  Pharmacy consulted to assist in monitoring and replacing electrolytes in this 78 y.o. female admitted on 05/24/2024 undergoing dofetilide  initiation.   Assessment:  Patient Exclusion Criteria: If any screening criteria checked as Yes, then  patient  should NOT receive dofetilide  until criteria item is corrected.  If "Yes" please indicate correction plan.  YES  NO Patient  Exclusion Criteria Correction Plan/Comments   []   [x]   Baseline QTc interval is greater than or equal to 440 msec. IF above YES box checked dofetilide  contraindicated unless patient has ICD; then may proceed if QTc 500-550 msec or with known ventricular conduction abnormalities may proceed with QTc 550-600 msec. QTc = 420 per clinic note    []   [x]   Patient is known or suspected to have a digoxin level greater than 2 ng/ml: No results found for: DIGOXIN     []   [x]   Creatinine clearance less than 20 ml/min (calculated using Cockcroft-Gault, actual body weight and serum creatinine): Estimated Creatinine Clearance: 53.9 mL/min (by C-G formula based on SCr of 0.98 mg/dL).     []   [x]  Patient has received drugs known to prolong the QT intervals within the last 48 hour (examples: phenothiazines, tricyclics or tetracyclic antidepressants, macrolides, 1st generation H-1 antihistamines (especially diphenhydramine), fluoroquinolones, azoles, ondansetron , metoclopramide , promethazine).   Updated information on QT prolonging agents is available to be searched on the following database:QT prolonging agents -If SSRI or antihistamine needed, preferred options are sertraline and loratadine respectively     []   [x]  Patient received a dose of a thiazide diuretic in the last 48 hours [including hydrochlorothiazide (Oretic) alone or in any combination including triamterene (Dyazide, Maxzide)].    []   [x]  Patient received a medication  known to increase dofetilide  plasma concentrations prior to initial dofetilide  dose:  Trimethoprim (Primsol, Proloprim) in the last 36 hours Verapamil (Calan, Verelan) in the last 36 hours or a sustained release dose in the last 72 hours Megestrol (Megace) in the last 5 days  Cimetidine (Tagamet) in the last 6 hours Ketoconazole (Nizoral) in the last 24 hours Itraconazole (Sporanox) in the last 48 hours  Prochlorperazine (Compazine) in the last 36 hours     []   [x]   Patient is known to have a history of torsades de pointes; congenital or acquired long QT syndromes.    []   [x]   Patient has received a Class 1 and Class 3 antiarrhythmic with less than 2 half-lives since last dose. (Disopyramide, Quinidine, Procainamide, Lidocaine, Mexiletine, Flecainide, Propafenone, Sotalol, Dronedarone)    []   [x]   Patient has received amiodarone therapy in the past 3 months or amiodarone level is greater than 0.3 ng/ml.    Labs:    Component Value Date/Time   K 4.1 05/24/2024 0831   MG 2.0 05/24/2024 0831     Plan: Select One Calculated CrCl  Dose q12h  [x]  > 60 ml/min 500 mcg  []  40-60 ml/min 250 mcg  []  20-40 ml/min 125 mcg   []   Physician selected initial dose within range recommended for patients level of renal function - will monitor for response.  []   Physician selected initial dose outside of range recommended for patients level of renal function - will discuss if the dose should be altered at this time.   Patient has been appropriately anticoagulated with apixaban .  Potassium: K >/= 4: Appropriate to initiate Tikosyn , no replacement needed    Magnesium : Mg 1.8-2: Give Mg 2 gm IV  x1 to prevent Mg from dropping below 1.8 - do not need to recheck Mg. Appropriate to initiate Tikosyn    Thank you for allowing pharmacy to participate in this patient's care   Prentice Poisson, PharmD Clinical Pharmacist **Pharmacist phone directory can now be found on [email or web address] ([email or web address]).  Listed under  [facility].

## 2024-05-24 NOTE — Telephone Encounter (Signed)
 Patient Product/process development scientist completed.    The patient is insured through [facility]. Patient has [employer], may use a copay card, and/or apply for patient assistance if available.    Ran test claim for dofetilide  (Tikosyn ) 500 mcg and the current 30 day co-pay is $5.00.   This test claim was processed through [facility]- copay amounts may vary at other pharmacies due to pharmacy/plan contracts, or as the patient moves through the different stages of their insurance plan.     Reyes Sharps, CPHT Pharmacy Technician III Certified Patient Advocate [facility] Patient Advocate Team Direct Number: [phone]  Fax: [phone]

## 2024-05-24 NOTE — H&P (Signed)
 Electrophysiology H&P  Note    Primary Care Physician: Dottie Norleen PHEBE PONCE, MD Primary Cardiologist: Dr Court Primary Electrophysiologist: none Referring Physician: Jolynn [facility]   Shannon Griffin is a 78 y.o. female with a history of HLD, HTN, hypothyroid, OSA, and atrial fibrillation who presents for follow up in the [facility]. The patient was initially diagnosed with atrial fibrillation in 2012 and had a DCCV in 2015. Patient is on Eliquis  for a CHADS2VASC score of 3. Patient was in her usual state of health until the evening of 09/07/20 when she noted elevated heart rates and increased fatigue. She took an extra dose of bisoprolol  on 11/20 and 11/21 and converted back to SR. She did have a high salt dinner prior to the onset of her symptoms which she believes triggered her afib.   On follow up 03/24/24, she is currently in Afib. Recently s/p DCCV on 12/23/23. Noted in serial cardiology visits 4/15 and 5/22 to have had ERAF (persistent). Started on diltiazem  120 mg daily. No missed doses of Eliquis . Patient notes to be tired.   On follow up 05/24/24, patient is here for Tikosyn  admission. No new medications since last OV. No benadryl use. No missed doses of anticoagulant.   Today, she denies symptoms of palpitations, chest pain, shortness of breath, orthopnea, PND, lower extremity edema, dizziness, presyncope, syncope, bleeding, or neurologic sequela. The patient is tolerating medications without difficulties and is otherwise without complaint today.    Atrial Fibrillation Risk Factors:  she does have symptoms or diagnosis of sleep apnea. she is compliant with CPAP therapy. she does not have a history of rheumatic fever. she does not have a history of alcohol use. The patient does have a history of early familial atrial fibrillation or other arrhythmias. Brother had afib.  she has a BMI of Body mass index is 46.56 kg/m.SABRA Filed Weights   05/24/24 1225  Weight:  111.8 kg       Family History  Problem Relation Age of Onset   Atrial fibrillation Mother    Atrial fibrillation Brother      Atrial Fibrillation Management history:  Previous antiarrhythmic drugs: none Previous cardioversions: 2015, 12/23/23 Previous ablations: none CHADS2VASC score: 3 Anticoagulation history: Eliquis    Past Medical History:  Diagnosis Date   Angio-edema    Atrial fibrillation (HCC) 06/27/2011   f/by Dr. Court of [facility]   Colonoscopy refused    Hyperlipidemia    Hypothyroid    Mammogram declined    Morbid obesity (HCC)    Multiple thyroid  nodules    known to Endocrine with previous biopsies   OSA on CPAP 10/25/2012   Osteoarthritis    Past Surgical History:  Procedure Laterality Date   CARDIOVERSION N/A 12/23/2023   Procedure: CARDIOVERSION;  Surgeon: Sheena Pugh, DO;  Location: [facility] INVASIVE CV LAB;  Service: Cardiovascular;  Laterality: N/A;   CHOLECYSTECTOMY     TONSILLECTOMY      Current Facility-Administered Medications  Medication Dose Route Frequency Provider Last Rate Last Admin   0.9 %  sodium chloride  infusion  250 mL Intravenous PRN Cindie Ole DASEN, MD       apixaban  (ELIQUIS ) tablet 5 mg  5 mg Oral BID Cindie Ole DASEN, MD       bisoprolol  (ZEBETA ) tablet 5 mg  5 mg Oral BID Suarez, Joseph J, PA-C       [START ON 05/25/2024] diltiazem  (CARDIZEM  CD) 24 hr capsule 240 mg  240 mg Oral Daily Terra,  Fairy PARAS, PA-C       dofetilide  (TIKOSYN ) capsule 500 mcg  500 mcg Oral BID Cindie Ole DASEN, MD       [START ON 05/25/2024] furosemide  (LASIX ) tablet 40 mg  40 mg Oral Daily Terra Fairy PARAS, PA-C       [START ON 05/25/2024] levothyroxine  (SYNTHROID ) tablet 112 mcg  112 mcg Oral Q0600 Terra Fairy PARAS, PA-C       sodium chloride  flush (NS) 0.9 % injection 3 mL  3 mL Intravenous Q12H Cindie Ole DASEN, MD       sodium chloride  flush (NS) 0.9 % injection 3 mL  3 mL Intravenous PRN Cindie Ole DASEN, MD        Allergies  Allergen  Reactions   Atorvastatin  Other (See Comments)    Joint/muscle aches   Repatha  [Evolocumab ] Other (See Comments)    Pain in the joints Head Aches Throat pain Pressure from the clavicle and up   Freeze It [Camphor-Menthol] Swelling    Histofreeze   Lidocaine Swelling   ROS- All systems are reviewed and negative except as per the HPI above.  Physical Exam: Vitals:   05/24/24 1225  BP: (!) 138/93  Pulse: (!) 109  Resp: 16  Temp: 98.4 F (36.9 C)  TempSrc: Oral  SpO2: 99%  Weight: 111.8 kg  Height: 5' 1 (1.549 m)    GEN- The patient is well appearing, alert and oriented x 3 today.   Neck - no JVD or carotid bruit noted Lungs- Clear to ausculation bilaterally, normal work of breathing Heart- Irregular tachycardic rate and rhythm, no murmurs, rubs or gallops, PMI not laterally displaced Extremities- no clubbing, cyanosis, or edema Skin - no rash or ecchymosis noted   Wt Readings from Last 3 Encounters:  05/24/24 111.8 kg  05/24/24 111.9 kg  05/20/24 108.9 kg    EKG today demonstrates  Vent. rate 119 BPM PR interval * ms QRS duration 76 ms QT/QTcB 260/365 ms P-R-T axes * 62 228 Atrial fibrillation with rapid ventricular response with premature ventricular or aberrantly conducted complexes Low voltage QRS ST & T wave abnormality, consider inferior ischemia Abnormal ECG When compared with ECG of 24-Mar-2024 10:24, No significant change was found  Echo 03/29/20 demonstrated  1. Left ventricular ejection fraction, by estimation, is 60 to 65%. The  left ventricle has normal function. The left ventricle has no regional  wall motion abnormalities. Left ventricular diastolic parameters were normal. The average left ventricular global longitudinal strain is -23.1 %.   2. Right ventricular systolic function is normal. The right ventricular  size is normal. There is moderately elevated pulmonary artery systolic pressure.   3. Left atrial size was mildly dilated.   4. The  mitral valve is normal in structure. Trivial mitral valve  regurgitation. No evidence of mitral stenosis.   5. The aortic valve is normal in structure. Aortic valve regurgitation is not visualized. No aortic stenosis is present.   6. Aortic dilatation noted. There is mild dilatation of the ascending  aorta measuring 38 mm.  Epic records are reviewed at length today  CHA2DS2-VASc Score = 4  The patient's score is based upon: CHF History: 0 HTN History: 1 Diabetes History: 0 Stroke History: 0 Vascular Disease History: 0 Age Score: 2 Gender Score: 1       ASSESSMENT AND PLAN: 1. Persistent Atrial Fibrillation (ICD10:  I48.0) The patient's CHA2DS2-VASc score is 4, indicating a 4.8% annual risk of stroke. S/p DCCV on 12/23/23.  Patient would like to presents for dofetilide  admission. Continue Eliquis  5 mg BID, states no missed doses in the last 3 weeks. No recent benadryl use PharmD has screened medications QTc in SR 420 ms Labs today pending.   CrCl 75 mL/min from [facility] on 5/22. PR 150 ms Qtc in NSR 420 ms  2. Secondary Hypercoagulable State (ICD10:  D68.69) The patient is at significant risk for stroke/thromboembolism based upon her CHA2DS2-VASc Score of 4.  Continue Apixaban  (Eliquis ).  No missed doses.   3. Obesity Body mass index is 46.56 kg/m. Encouraged to stay active as tolerated.  4. Obstructive sleep apnea Patient reports compliance with CPAP therapy. Followed by Dr Burnard  5. CAD Coronary calcium  score 141, 68th percentile on CT 10/26/19 No chest pain.    Pt presents for admission as above.   Shannon Jodie Passey, PA-C  05/24/2024 12:48 PM

## 2024-05-25 DIAGNOSIS — I4819 Other persistent atrial fibrillation: Secondary | ICD-10-CM | POA: Diagnosis not present

## 2024-05-25 LAB — BASIC METABOLIC PANEL WITH GFR
Anion gap: 11 (ref 5–15)
BUN: 12 mg/dL (ref 8–23)
CO2: 27 mmol/L (ref 22–32)
Calcium: 9.2 mg/dL (ref 8.9–10.3)
Chloride: 101 mmol/L (ref 98–111)
Creatinine, Ser: 1.01 mg/dL — ABNORMAL HIGH (ref 0.44–1.00)
GFR, Estimated: 57 mL/min — ABNORMAL LOW (ref >60)
Glucose, Bld: 148 mg/dL — ABNORMAL HIGH (ref 70–99)
Potassium: 3.8 mmol/L (ref 3.5–5.1)
Sodium: 139 mmol/L (ref 135–145)

## 2024-05-25 LAB — MAGNESIUM: Magnesium: 2.3 mg/dL (ref 1.7–2.4)

## 2024-05-25 MED ORDER — DOFETILIDE 250 MCG PO CAPS
250.0000 ug | ORAL_CAPSULE | Freq: Two times a day (BID) | ORAL | Status: DC
  Administered 2024-05-25 – 2024-05-27 (×4): 250 ug via ORAL
  Filled 2024-05-25 (×4): qty 1

## 2024-05-25 MED ORDER — ACETAMINOPHEN 325 MG PO TABS
975.0000 mg | ORAL_TABLET | Freq: Once | ORAL | Status: AC
  Filled 2024-05-25: qty 3

## 2024-05-25 MED ORDER — MAGNESIUM SULFATE 2 GM/50ML IV SOLN
2.0000 g | Freq: Once | INTRAVENOUS | Status: DC
  Administered 2024-05-25: 2 g via INTRAVENOUS
  Filled 2024-05-25: qty 50

## 2024-05-25 MED ORDER — POTASSIUM CHLORIDE CRYS ER 20 MEQ PO TBCR
40.0000 meq | EXTENDED_RELEASE_TABLET | Freq: Once | ORAL | Status: AC
  Administered 2024-05-25: 40 meq via ORAL
  Filled 2024-05-25: qty 2

## 2024-05-25 MED ORDER — ACETAMINOPHEN 325 MG PO TABS
975.0000 mg | ORAL_TABLET | Freq: Four times a day (QID) | ORAL | Status: DC | PRN
  Administered 2024-05-25 – 2024-05-26 (×2): 975 mg via ORAL
  Filled 2024-05-25: qty 3

## 2024-05-25 NOTE — Progress Notes (Addendum)
 Pharmacy: Dofetilide  (Tikosyn ) - Follow Up Assessment and Electrolyte Replacement  Pharmacy consulted to assist in monitoring and replacing electrolytes in this 78 y.o. female admitted on 05/24/2024 undergoing dofetilide  initiation.   -K= 3.8, Mg= 2.3  Plan: Potassium: -Kdur x1  Magnesium : Mg 1.8-2: Give Mg 2 gm IV x1    Thank you for allowing pharmacy to participate in this patient's care   Prentice Poisson, PharmD Clinical Pharmacist **Pharmacist phone directory can now be found on [email or web address] ([email or web address]).  Listed under [facility].

## 2024-05-25 NOTE — TOC CM/SW Note (Signed)
 Transition of Care [facility]) - Inpatient Brief Assessment   Patient Details  Name: Danniela Mcbrearty MRN: [id number] Date of Birth: 10-06-1946  Transition of Care [facility]) CM/SW Contact:    Sudie Erminio Deems, RN Phone Number: 05/25/2024, 11:45 AM   Clinical Narrative: Patient presented for Tikosyn  Load. Case Manager spoke with the patient regarding co pay cost. Patient is agreeable to cost and would like to have the initial Rx filled via [facility] and the Rx refills 90 day supply escribed to [facility] in [place]. No further needs identified at this time.   Transition of Care Asessment: Insurance and Status: Insurance coverage has been reviewed Patient has primary care physician: Yes Home environment has been reviewed: reviewed Prior level of function:: independent Prior/Current Home Services: No current home services Social Drivers of Health Review: SDOH reviewed no interventions necessary Readmission risk has been reviewed: Yes Transition of care needs: no transition of care needs at this time

## 2024-05-25 NOTE — Progress Notes (Addendum)
 Morning EKG reviewed     Shows has converted to NSR with borderline QTc at ~460-480 ms but very slurred.  Reviewed with Dr. Cindie and will decrease dose to 250 mcg with this evenings dose.   Potassium3.8 (08/06 0941) Magnesium   2.3 (08/06 0941) Creatinine, ser  1.01* (08/06 0941)  Plan for home Friday if QTc remains stable   Shannon Griffin, [place]  05/25/2024 11:41 AM

## 2024-05-25 NOTE — Progress Notes (Signed)
   Electrophysiology Rounding Note  Patient Name: Shannon Griffin Date of Encounter: 05/25/2024  Primary Cardiologist: Dorn Lesches, MD  Electrophysiologist: None    Subjective   Pt remains in afib on Tikosyn  500 mcg BID   QTc from EKG last pm shows stable QTc  The patient is doing well today.  At this time, the patient denies chest pain, shortness of breath, or any new concerns.  Inpatient Medications    Scheduled Meds:  apixaban   5 mg Oral BID   bisoprolol   5 mg Oral BID   diltiazem   240 mg Oral Daily   dofetilide   500 mcg Oral BID   furosemide   40 mg Oral Daily   levothyroxine   112 mcg Oral Q0600   sodium chloride  flush  3 mL Intravenous Q12H   Continuous Infusions:  sodium chloride      PRN Meds: sodium chloride , sodium chloride  flush   Vital Signs    Vitals:   05/24/24 1225 05/24/24 2322 05/25/24 0359  BP: (!) 138/93 (!) 135/91 123/68  Pulse: (!) 109 83 75  Resp: 16 20 15   Temp: 98.4 F (36.9 C) 98 F (36.7 C) 98.6 F (37 C)  TempSrc: Oral Oral Oral  SpO2: 99% 100% 100%  Weight: 111.8 kg    Height: 5' 1 (1.549 m)      Intake/Output Summary (Last 24 hours) at 05/25/2024 0704 Last data filed at 05/24/2024 2015 Gross per 24 hour  Intake 1293 ml  Output --  Net 1293 ml   Filed Weights   05/24/24 1225  Weight: 111.8 kg    Physical Exam    GEN- NAD, A&O x 3. Normal affect.  Lungs- CTAB, Normal effort.  Heart- Irregularly irregular rate and rhythm. No M/G/R GI- Soft, NT, ND Extremities- No clubbing, cyanosis, or edema Skin- no rash or lesion  Labs    CBC No results for input(s): WBC, NEUTROABS, HGB, HCT, MCV, PLT in the last 72 hours. Basic Metabolic Panel Recent Labs    91/94/74 0831  NA 144  K 4.1  CL 100  CO2 31*  GLUCOSE 115*  BUN 17  CREATININE 0.98  CALCIUM  9.7  MG 2.0    Telemetry    AF 100-120s (personally reviewed)  Patient Profile     Shannon Griffin is a 78 y.o. female with a past medical history  significant for persistent atrial fibrillation.  They were admitted for tikosyn  load.   Assessment & Plan    Persistent atrial fibrillation Pt remains in afib on Tikosyn  500 mcg BID  Continue Eliquis  Creatinine, ser  0.98 (08/05 0831) Magnesium   2.0 (08/05 0831) Potassium4.1 (08/05 0831) Supplement Mg  If pt does not convert chemically, plan on DCCV tomorrow    For questions or updates, please contact [facility] Please consult [email or web address] for contact info under Cardiology/STEMI.  Signed, Ozell Prentice Passey, PA-C  05/25/2024, 7:04 AM

## 2024-05-25 NOTE — Progress Notes (Signed)
   05/25/24 2200  BiPAP/CPAP/SIPAP  $ Non-Invasive Ventilator  Non-Invasive Vent Subsequent  $ Face Mask Medium Yes  BiPAP/CPAP/SIPAP Pt Type Adult  BiPAP/CPAP/SIPAP DREAMSTATIOND  Mask Type Nasal pillows  Mask Size Medium  Respiratory Rate 16 breaths/min  Pressure Support 4 cmH20  Patient Home Machine No  Patient Home Mask Yes  Patient Home Tubing No  CPAP/SIPAP surface wiped down Yes  Device Plugged into RED Power Outlet Yes

## 2024-05-26 DIAGNOSIS — I4819 Other persistent atrial fibrillation: Secondary | ICD-10-CM | POA: Diagnosis not present

## 2024-05-26 LAB — MAGNESIUM: Magnesium: 2.1 mg/dL (ref 1.7–2.4)

## 2024-05-26 LAB — BASIC METABOLIC PANEL WITH GFR
Anion gap: 9 (ref 5–15)
BUN: 14 mg/dL (ref 8–23)
CO2: 25 mmol/L (ref 22–32)
Calcium: 9.1 mg/dL (ref 8.9–10.3)
Chloride: 105 mmol/L (ref 98–111)
Creatinine, Ser: 0.91 mg/dL (ref 0.44–1.00)
GFR, Estimated: >60 mL/min (ref >60)
Glucose, Bld: 94 mg/dL (ref 70–99)
Potassium: 3.7 mmol/L (ref 3.5–5.1)
Sodium: 139 mmol/L (ref 135–145)

## 2024-05-26 MED ORDER — BISOPROLOL FUMARATE 5 MG PO TABS
5.0000 mg | ORAL_TABLET | Freq: Every day | ORAL | Status: DC
Start: 2024-05-26 — End: 2024-05-27
  Administered 2024-05-26: 5 mg via ORAL
  Filled 2024-05-26: qty 1

## 2024-05-26 MED ORDER — POTASSIUM CHLORIDE CRYS ER 20 MEQ PO TBCR
60.0000 meq | EXTENDED_RELEASE_TABLET | Freq: Once | ORAL | Status: AC
  Administered 2024-05-26: 60 meq via ORAL
  Filled 2024-05-26: qty 3

## 2024-05-26 NOTE — Progress Notes (Signed)
 Morning EKG reviewed     Shows remains in NSR with borderline QTc at ~460 ms when measured manually and corrected for bradycardia  Continue  Tikosyn  250 mcg BID for now  Potassium3.7 (08/07 0520) Magnesium   2.1 (08/07 0520) Creatinine, ser  0.91 (08/07 0520)  Plan for home Friday if QTc remains stable   Ozell Prentice Passey, PA-C  05/26/2024 11:04 AM

## 2024-05-26 NOTE — Progress Notes (Addendum)
 Pharmacy: Dofetilide  (Tikosyn ) - Follow Up Assessment and Electrolyte Replacement  Pharmacy consulted to assist in monitoring and replacing electrolytes in this 78 y.o. female admitted on 05/24/2024 undergoing dofetilide  initiation. First dofetilide  dose: 8/5  Labs:    Component Value Date/Time   K 3.7 05/26/2024 0520   MG 2.1 05/26/2024 0520     Plan: Potassium: K 3.5-3.7:  Give KCl 60 mEq po x1   Magnesium : Mg > 2: No additional supplementation needed   As patient has required on average 30 mEq of potassium replacement every day, recommend discharging patient with prescription for:  Potassium chloride  20 mEq  daily  Thank you for allowing pharmacy to participate in this patient's care   Jinnie Door, PharmD, BCPS, BCCP Clinical Pharmacist  Please check AMION for all [facility] phone numbers After 10:00 PM, call Main Pharmacy [phone]

## 2024-05-26 NOTE — Progress Notes (Signed)
   Electrophysiology Rounding Note  Patient Name: Shannon Griffin Date of Encounter: 05/26/2024  Primary Cardiologist: Dorn Lesches, MD  Electrophysiologist: None    Subjective   Pt remains in NSR on Tikosyn  250 mcg BID   QTc from EKG last pm shows borderline QTc at ~480-490 when corrected for her significant bradycardia.   The patient is doing well today.  At this time, the patient denies chest pain, shortness of breath, or any new concerns.  Inpatient Medications    Scheduled Meds:  apixaban   5 mg Oral BID   bisoprolol   5 mg Oral QHS   dofetilide   250 mcg Oral BID   furosemide   40 mg Oral Daily   levothyroxine   112 mcg Oral Q0600   potassium chloride   60 mEq Oral Once   sodium chloride  flush  3 mL Intravenous Q12H   Continuous Infusions:  PRN Meds: acetaminophen , sodium chloride  flush   Vital Signs    Vitals:   05/25/24 1941 05/25/24 2200 05/25/24 2320 05/26/24 0429  BP: (!) 147/70  139/78 139/80  Pulse: 90  (!) 56 (!) 49  Resp: 20  18 20   Temp: 98.6 F (37 C)  98.5 F (36.9 C) 98.6 F (37 C)  TempSrc: Oral  Oral Oral  SpO2: 100% 100% 100% 99%  Weight:      Height:        Intake/Output Summary (Last 24 hours) at 05/26/2024 0750 Last data filed at 05/25/2024 2045 Gross per 24 hour  Intake 960 ml  Output --  Net 960 ml   Filed Weights   05/24/24 1225  Weight: 111.8 kg    Physical Exam    GEN- NAD, A&O x 3. Normal affect.  Lungs- CTAB, Normal effort.  Heart- Slow but regular rate and rhythm. No M/G/R GI- Soft, NT, ND Extremities- No clubbing, cyanosis, or edema Skin- no rash or lesion  Labs    CBC No results for input(s): WBC, NEUTROABS, HGB, HCT, MCV, PLT in the last 72 hours. Basic Metabolic Panel Recent Labs    91/93/74 0941 05/26/24 0520  NA 139 139  K 3.8 3.7  CL 101 105  CO2 27 25  GLUCOSE 148* 94  BUN 12 14  CREATININE 1.01* 0.91  CALCIUM  9.2 9.1  MG 2.3 2.1    Telemetry    SB/NSR 40-60s (personally  reviewed)  Patient Profile     Shannon Griffin is a 78 y.o. female with a past medical history significant for persistent atrial fibrillation.  They were admitted for tikosyn  load.   Assessment & Plan    Persistent atrial fibrillation Pt remains in NSR on Tikosyn  250 mcg BID  Continue Eliquis  Creatinine, ser  0.91 (08/07 0520) Magnesium   2.1 (08/07 0520) Potassium3.7 (08/07 0520) Supplement K  Bradycardia STOP diltiazem  Decrease bisoprolol  to 5 mg at bedtime only.   Will see if EKG improves further after am dose of 250 mcg with holding her AV nodal agents.    For questions or updates, please contact [facility] Please consult [email or web address] for contact info under Cardiology/STEMI.  Signed, Ozell Prentice Passey, PA-C  05/26/2024, 7:50 AM

## 2024-05-27 ENCOUNTER — Other Ambulatory Visit (HOSPITAL_COMMUNITY): Payer: Self-pay

## 2024-05-27 DIAGNOSIS — I4819 Other persistent atrial fibrillation: Secondary | ICD-10-CM | POA: Diagnosis not present

## 2024-05-27 LAB — BASIC METABOLIC PANEL WITH GFR
Anion gap: 9 (ref 5–15)
BUN: 20 mg/dL (ref 8–23)
CO2: 27 mmol/L (ref 22–32)
Calcium: 9.3 mg/dL (ref 8.9–10.3)
Chloride: 104 mmol/L (ref 98–111)
Creatinine, Ser: 1.04 mg/dL — ABNORMAL HIGH (ref 0.44–1.00)
GFR, Estimated: 55 mL/min — ABNORMAL LOW (ref >60)
Glucose, Bld: 93 mg/dL (ref 70–99)
Potassium: 4.1 mmol/L (ref 3.5–5.1)
Sodium: 140 mmol/L (ref 135–145)

## 2024-05-27 LAB — MAGNESIUM: Magnesium: 2.2 mg/dL (ref 1.7–2.4)

## 2024-05-27 MED ORDER — DOFETILIDE 250 MCG PO CAPS
250.0000 ug | ORAL_CAPSULE | Freq: Two times a day (BID) | ORAL | 6 refills | Status: DC
  Filled 2024-05-27: qty 60, 30d supply, fill #0

## 2024-05-27 MED ORDER — BISOPROLOL FUMARATE 5 MG PO TABS: 5.0000 mg | ORAL_TABLET | Freq: Every day | ORAL | 3 refills | Status: DC

## 2024-05-27 NOTE — Discharge Instructions (Signed)
 Dofetilide  Capsules What is this medication? DOFETILIDE  (doe FET il ide) treats a fast or irregular heartbeat (arrhythmia). It works by slowing down overactive electric signals in the heart, which stabilizes your heart rhythm. It belongs to a group of medications called antiarrhythmics. This medicine may be used for other purposes; ask your health care provider or pharmacist if you have questions. COMMON BRAND NAME(S): Tikosyn  What should I tell my care team before I take this medication? They need to know if you have any of these conditions: Heart disease History of irregular heartbeat History of low levels of potassium or magnesium  in the blood Kidney disease Liver disease An unusual or allergic reaction to dofetilide , other medications, foods, dyes, or preservatives Pregnant or trying to get pregnant Breast-feeding How should I use this medication? Take this medication by mouth with a glass of water. Follow the directions on the prescription label. Do not take with grapefruit juice. You can take it with or without food. If it upsets your stomach, take it with food. Take your medication at regular intervals. Do not take it more often than directed. Do not stop taking except on your care team's advice. A special MedGuide will be given to you by the pharmacist with each prescription and refill. Be sure to read this information carefully each time. Talk to your care team about the use of this medication in children. Special care may be needed. Overdosage: If you think you have taken too much of this medicine contact a poison control center or emergency room at once. NOTE: This medicine is only for you. Do not share this medicine with others. What if I miss a dose? If you miss a dose, skip it. Take your next dose at the normal time. Do not take extra or 2 doses at the same time to make up for the missed dose. What may interact with this medication? Do not take this medication with any of  the following: Benadryl (Diphenhydramine) Cimetidine (Tagamet) Cisapride Dolutegravir Dronedarone Erdafitinib Hydrochlorothiazide Immodium Ketoconazole Megestrol Pimozide Prochlorperazine Thioridazine Trimethoprim Verapamil This medication may also interact with the following: Amiloride Cannabinoids Certain antibiotics like erythromycin or clarithromycin  Certain antiviral medications for HIV or hepatitis Certain medications for depression, anxiety, or psychotic disorders Digoxin  Diltiazem  Grapefruit juice Metformin  Nefazodone Other medications that prolong the QT interval (an abnormal heart rhythm) Quinine Triamterene Zafirlukast Ziprasidone This list may not describe all possible interactions. Give your health care provider a list of all the medicines, herbs, non-prescription drugs, or dietary supplements you use. Also tell them if you smoke, drink alcohol, or use illegal drugs. Some items may interact with your medicine. What should I watch for while using this medication? Your condition will be monitored carefully while you are receiving this medication. What side effects may I notice from receiving this medication? Side effects that you should report to your care team as soon as possible: Allergic reactions--skin rash, itching, hives, swelling of the face, lips, tongue, or throat Chest pain Heart rhythm changes--fast or irregular heartbeat, dizziness, feeling faint or lightheaded, chest pain, trouble breathing Side effects that usually do not require medical attention (report to your care team if they continue or are bothersome): Dizziness Headache Nausea Stomach pain Trouble sleeping This list may not describe all possible side effects. Call your doctor for medical advice about side effects. You may report side effects to FDA at 1-800-FDA-1088. Where should I keep my medication? Keep out of the reach of children. Store at room temperature between 15  and 30  degrees C (59 and 86 degrees F). Throw away any unused medication after the expiration date. NOTE: This sheet is a summary. It may not cover all possible information. If you have questions about this medicine, talk to your doctor, pharmacist, or health care provider.  2024 Elsevier/Gold Standard (2021-09-06 00:00:00)

## 2024-05-27 NOTE — Progress Notes (Signed)
 Pharmacy: Dofetilide  (Tikosyn ) - Follow Up Assessment and Electrolyte Replacement  Pharmacy consulted to assist in monitoring and replacing electrolytes in this 78 y.o. female admitted on 05/24/2024 undergoing dofetilide  initiation. First dofetilide  dose: 8/5  Labs:    Component Value Date/Time   K 4.1 05/27/2024 0455   MG 2.2 05/27/2024 0455     Plan: Potassium: K >/= 4: No additional supplementation needed  Magnesium : Mg > 2: No additional supplementation needed   As patient has required on average 25 mEq of potassium replacement every day, recommend discharging patient with prescription for:  Potassium chloride  10 mEq  daily  Thank you for allowing pharmacy to participate in this patient's care   Jinnie Door, PharmD, BCPS, BCCP Clinical Pharmacist  Please check AMION for all [facility] Pharmacy phone numbers After 10:00 PM, call Main Pharmacy [phone]

## 2024-05-27 NOTE — Progress Notes (Signed)
 EKG from yesterday evening 05/26/2024 reviewed     Shows remains in NSR with borderline QTc at 460-470 ms when measured manually and corrected for bradycardia  Continue  Tikosyn  250 mcg BID.   Potassium4.1 (08/08 0455) Magnesium   2.2 (08/08 0455) Creatinine, ser  1.04* (08/08 0455)  Plan for home this afternoon if QTc remains stable.   Ozell Prentice Passey, PA-C  05/27/2024 7:19 AM

## 2024-05-27 NOTE — Progress Notes (Signed)
 DC paperwork reviewed with pt by DC RN.  Home synthroid  112mg  - 2 tabs returned to pt by this RN.  Pt has belongings.  Transported to discharge lounge via wheelchair without incident.

## 2024-05-27 NOTE — Discharge Summary (Signed)
 ELECTROPHYSIOLOGY DISCHARGE SUMMARY    Patient ID: Shannon Griffin,  MRN: [id number], DOB/AGE: 1946-04-12 78 y.o.  Admit date: 05/24/2024 Discharge date: 05/27/2024  Primary Care Physician: Dottie Norleen PHEBE PONCE, MD  Primary Cardiologist: Dorn Lesches, MD  Electrophysiologist: Dr. Cindie   Primary Discharge Diagnosis:  Persistent atrial fibrillation status post Tikosyn  loading this admission  Secondary Discharge Diagnosis:  Bradycardia  Allergies  Allergen Reactions   Atorvastatin  Other (See Comments)    Joint/muscle aches   Repatha  [Evolocumab ] Other (See Comments)    Pain in the joints Head Aches Throat pain Pressure from the clavicle and up   Freeze It [Camphor-Menthol] Swelling    Histofreeze   Lidocaine Swelling     Procedures This Admission:  1.  Tikosyn  loading   Brief HPI: Shannon Griffin is a 78 y.o. female with a past medical history as noted above.  They were referred to EP for treatment options of atrial fibrillation.  Risks, benefits, and alternatives to Tikosyn  were reviewed with the patient who wished to proceed with admission for loading.  Hospital Course:  The patient was admitted and Tikosyn  was initiated.  Renal function and electrolytes were followed during the hospitalization.  Their QTc remained stable. The patient converted chemically and did not require cardioversion. The patients QT prolonged, requiring dose reduction to 250 mcg BID. They were monitored on telemetry up to discharge. On the day of discharge, they were examined by Dr. Cindie  who considered them stable for discharge to home.  Follow-up has been arranged with the Atrial Fibrillation clinic in approximately 1 week.   Physical Exam: Vitals:   05/26/24 1929 05/27/24 0048 05/27/24 0500 05/27/24 0746  BP: (!) 154/57 126/60 132/65 138/60  Pulse: (!) 57 (!) 45 (!) 50 (!) 50  Resp: 16 20 15 18   Temp: 98.2 F (36.8 C) 98.4 F (36.9 C) 98.2 F (36.8 C) 98.1 F (36.7 C)  TempSrc:  Oral Oral Oral Oral  SpO2: 100% 99% 100% 100%  Weight:      Height:        GEN- NAD, A&O x 3. Normal affect.  Lungs- CTAB, Normal effort.  Heart- Regular rate and rhythm. No M/G/R GI- Soft, NT, ND Extremities- No clubbing, cyanosis, or edema Skin- no rash or lesion  Labs:   Lab Results  Component Value Date   WBC 9.7 02/02/2024   HGB 14.8 02/02/2024   HCT 46.8 (H) 02/02/2024   MCV 85 02/02/2024   PLT 198 02/02/2024    Recent Labs  Lab 05/27/24 0455  NA 140  K 4.1  CL 104  CO2 27  BUN 20  CREATININE 1.04*  CALCIUM  9.3  GLUCOSE 93    Discharge Medications:  Allergies as of 05/27/2024       Reactions   Atorvastatin  Other (See Comments)   Joint/muscle aches   Repatha  [evolocumab ] Other (See Comments)   Pain in the joints Head Aches Throat pain Pressure from the clavicle and up   Freeze It [camphor-menthol] Swelling   Histofreeze   Lidocaine Swelling        Medication List     STOP taking these medications    diltiazem  240 MG 24 hr capsule Commonly known as: Cardizem  CD       TAKE these medications    bisoprolol  5 MG tablet Commonly known as: ZEBETA  Take 1 tablet (5 mg total) by mouth at bedtime. What changed:  medication strength when to take this   DESITIN EX Apply 1  Application topically as needed (Irritation).   diltiazem  30 MG tablet Commonly known as: CARDIZEM  TAKE ONE TABLET BY MOUTH EVERY 4 HOURS AS NEEDED FOR HEART RATE > 100   dofetilide  250 MCG capsule Commonly known as: TIKOSYN  Take 1 capsule (250 mcg total) by mouth 2 (two) times daily.   Eliquis  5 MG Tabs tablet Generic drug: apixaban  TAKE ONE TABLET BY MOUTH TWICE A DAY   furosemide  40 MG tablet Commonly known as: LASIX  Take 1 tablet (40 mg total) by mouth daily.   hydroxypropyl methylcellulose / hypromellose 2.5 % ophthalmic solution Commonly known as: ISOPTO TEARS / GONIOVISC Place 1 drop into both eyes in the morning and at bedtime.   levothyroxine  112 MCG  tablet Commonly known as: SYNTHROID  Take 112 mcg by mouth See admin instructions. Take 1 tablet (112 mcg) by mouth on Mondays to Saturdays in the morning & skip dose on Sundays.   multivitamin with minerals tablet Take 1 tablet by mouth daily after lunch.   NEOSPORIN EX Apply 1 Application topically as needed (skin folds).        Disposition:  Home with follow up in AF clinic in 1 week as in AVS.   Duration of Discharge Encounter:  APP time: 22 minutes  Signed, Ozell Prentice Passey, PA-C  05/27/2024 11:09 AM

## 2024-05-27 NOTE — Care Management Important Message (Signed)
 Important Message  Patient Details  Name: Shannon Griffin MRN: [id number] Date of Birth: 13-Feb-1946   Important Message Given:  Yes - Medicare IM     [name] Sharps 05/27/2024, 11:08 AM

## 2024-06-01 ENCOUNTER — Telehealth: Payer: Self-pay | Admitting: *Deleted

## 2024-06-01 DIAGNOSIS — I10 Essential (primary) hypertension: Secondary | ICD-10-CM

## 2024-06-01 DIAGNOSIS — G4733 Obstructive sleep apnea (adult) (pediatric): Secondary | ICD-10-CM

## 2024-06-01 NOTE — Telephone Encounter (Signed)
-----   Message from Wilbert Bihari sent at 05/20/2024  8:21 AM EDT ----- order her a new ResMed CPAP at 9cm H2O with heated humidity and mask of choice

## 2024-06-01 NOTE — Telephone Encounter (Signed)
 order her a new ResMed CPAP at 9cm H2O with heated humidity and mask of choice   Order new CPAP supplies   Order a chin strap as well   Will place order to [facility].

## 2024-06-03 ENCOUNTER — Inpatient Hospital Stay (INDEPENDENT_AMBULATORY_CARE_PROVIDER_SITE_OTHER)
Admission: RE | Admit: 2024-06-03 | Discharge: 2024-06-03 | Disposition: A | Discharge status: Home / Self Care | Source: Ambulatory Visit | Attending: Physician Assistant | Admitting: Physician Assistant

## 2024-06-03 VITALS — BP 130/80 | HR 59 | Ht 61.0 in | Wt 244.0 lb

## 2024-06-03 DIAGNOSIS — D6869 Other thrombophilia: Secondary | ICD-10-CM

## 2024-06-03 DIAGNOSIS — Z5181 Encounter for therapeutic drug level monitoring: Secondary | ICD-10-CM

## 2024-06-03 DIAGNOSIS — I4819 Other persistent atrial fibrillation: Secondary | ICD-10-CM

## 2024-06-03 DIAGNOSIS — Z79899 Other long term (current) drug therapy: Secondary | ICD-10-CM | POA: Diagnosis not present

## 2024-06-03 MED ORDER — DOFETILIDE 250 MCG PO CAPS: 250.0000 ug | ORAL_CAPSULE | Freq: Two times a day (BID) | ORAL | 2 refills | Status: DC

## 2024-06-03 NOTE — Progress Notes (Signed)
 Primary Care Physician: Dottie Norleen PHEBE PONCE, MD Primary Cardiologist: Dr Court Primary Electrophysiologist: Dr Cindie Referring Physician: [facility]   Shannon Griffin is a 78 y.o. female with a history of HLD, HTN, hypothyroid, OSA, and atrial fibrillation who presents for follow up in the [facility]. The patient was initially diagnosed with atrial fibrillation in 2012 and had a DCCV in 2015. Patient is on Eliquis  for stroke prevention. S/p DCCV on 12/23/23. Noted in serial cardiology visits 4/15 and 5/22 to have had ERAF (persistent). Started on diltiazem  120 mg daily. No missed doses of Eliquis . Patient notes to be tired.   Patient returns for follow up for atrial fibrillation and dofetilide  monitoring. She is s/p dofetilide  loading 8/5-05/27/24. She converted chemically and did not require DCCV. She reports that her SOB and fatigue have improved with SR. No bleeding issues on anticoagulation. She did have one episode of tachypalpitations which responded quickly to a PRN dose of diltiazem .   Today, she  denies symptoms of palpitations, chest pain, shortness of breath, orthopnea, PND, lower extremity edema, dizziness, presyncope, syncope, bleeding, or neurologic sequela. The patient is tolerating medications without difficulties and is otherwise without complaint today.    Atrial Fibrillation Risk Factors:  she does have symptoms or diagnosis of sleep apnea. she does not have a history of rheumatic fever. she does not have a history of alcohol use. The patient does have a history of early familial atrial fibrillation or other arrhythmias. Brother had afib.   Atrial Fibrillation Management history:  Previous antiarrhythmic drugs: dofetilide   Previous cardioversions: 2015, 12/23/23 Previous ablations: none Anticoagulation history: Eliquis    Past Medical History:  Diagnosis Date   Angio-edema    Atrial fibrillation (HCC) 06/27/2011   f/by Dr. Court  of [facility]   Colonoscopy refused    Hyperlipidemia    Hypothyroid    Mammogram declined    Morbid obesity (HCC)    Multiple thyroid  nodules    known to Endocrine with previous biopsies   OSA on CPAP 10/25/2012   Osteoarthritis      Current Outpatient Medications  Medication Sig Dispense Refill   Bacitracin-Polymyxin B (NEOSPORIN EX) Apply 1 Application topically as needed (skin folds).     bisoprolol  (ZEBETA ) 5 MG tablet Take 1 tablet (5 mg total) by mouth at bedtime. 90 tablet 3   diltiazem  (CARDIZEM ) 30 MG tablet TAKE ONE TABLET BY MOUTH EVERY 4 HOURS AS NEEDED FOR HEART RATE > 100 30 tablet 1   dofetilide  (TIKOSYN ) 250 MCG capsule Take 1 capsule (250 mcg total) by mouth 2 (two) times daily. 60 capsule 6   ELIQUIS  5 MG TABS tablet TAKE ONE TABLET BY MOUTH TWICE A DAY 180 tablet 1   furosemide  (LASIX ) 40 MG tablet Take 1 tablet (40 mg total) by mouth daily. 90 tablet 3   hydroxypropyl methylcellulose / hypromellose (ISOPTO TEARS / GONIOVISC) 2.5 % ophthalmic solution Place 1 drop into both eyes in the morning and at bedtime.     levothyroxine  (SYNTHROID , LEVOTHROID) 112 MCG tablet Take 112 mcg by mouth See admin instructions. Take 1 tablet (112 mcg) by mouth on Mondays to Saturdays in the morning & skip dose on Sundays.     Multiple Vitamins-Minerals (MULTIVITAMIN WITH MINERALS) tablet Take 1 tablet by mouth daily after lunch.     Zinc Oxide (DESITIN EX) Apply 1 Application topically as needed (Irritation).     No current facility-administered medications for this encounter.  Allergies  Allergen Reactions   Atorvastatin  Other (See Comments)    Joint/muscle aches   Repatha  [Evolocumab ] Other (See Comments)    Pain in the joints Head Aches Throat pain Pressure from the clavicle and up   Freeze It [Camphor-Menthol] Swelling    Histofreeze   Lidocaine Swelling   Tape     It melts the skin and causes sores   ROS- All systems are reviewed and negative except as per the HPI  above.  Physical Exam: Vitals:   06/03/24 1018  Pulse: (!) 59  Weight: 110.7 kg  Height: 5' 1 (1.549 m)    GEN: Well nourished, well developed in no acute distress CARDIAC: Regular rate and rhythm with occasional ectopy, no murmurs, rubs, gallops RESPIRATORY:  Clear to auscultation without rales, wheezing or rhonchi  ABDOMEN: Soft, non-tender, non-distended EXTREMITIES:  No edema; No deformity    Wt Readings from Last 3 Encounters:  06/03/24 110.7 kg  05/24/24 111.8 kg  05/24/24 111.9 kg    EKG today demonstrates  SR with PACs and blocked PACs Vent. rate 59 BPM PR interval 138 ms QRS duration 86 ms QT/QTcB 322/318 ms   Echo 04/20/24 demonstrated   1. Left ventricular ejection fraction, by estimation, is 60 to 65%. Left  ventricular ejection fraction by 3D volume is 61 %. The left ventricle has  normal function. The left ventricle has no regional wall motion  abnormalities. Left ventricular diastolic function could not be evaluated.   2. Right ventricular systolic function is normal. The right ventricular  size is normal. There is mildly elevated pulmonary artery systolic  pressure. The estimated right ventricular systolic pressure is 38.2 mmHg.   3. Left atrial size was mild to moderately dilated.   4. Right atrial size was moderately dilated.   5. The mitral valve is normal in structure. Mild mitral valve  regurgitation. No evidence of mitral stenosis.   6. Tricuspid valve regurgitation is mild to moderate.   7. The aortic valve is normal in structure. Aortic valve regurgitation is  not visualized. No aortic stenosis is present.   8. Aortic dilatation noted. There is dilatation of the ascending aorta,  measuring 38 mm.   9. The inferior vena cava is dilated in size with >50% respiratory  variability, suggesting right atrial pressure of 8 mmHg.   Epic records are reviewed at length today   CHA2DS2-VASc Score = 4  The patient's score is based upon: CHF History:  0 HTN History: 1 Diabetes History: 0 Stroke History: 0 Vascular Disease History: 0 Age Score: 2 Gender Score: 1       ASSESSMENT AND PLAN: Persistent Atrial Fibrillation (ICD10:  I48.19) The patient's CHA2DS2-VASc score is 4, indicating a 4.8% annual risk of stroke.   S/p dofetilide  admission 8/5-05/27/24 Patient appears to be maintaining SR Continue dofetilide  250 mcg BID Continue bisoprolol  5 mg daily Continue diltiazem  30 mg PRN q 4 hours for heart racing.  Continue Eliquis  5 mg BID  Secondary Hypercoagulable State (ICD10:  D68.69) The patient is at significant risk for stroke/thromboembolism based upon her CHA2DS2-VASc Score of 4.  Continue Apixaban  (Eliquis ). No bleeding issues.   High Risk Medication Monitoring (ICD 10: U5195107) Patient requires ongoing monitoring for anti-arrhythmic medication which has the potential to cause life threatening arrhythmias. QT interval on ECG acceptable for dofetilide  monitoring. Check bmet/mag today.     Obesity Body mass index is 46.1 kg/m.  Encouraged lifestyle modification  OSA  Encouraged nightly CPAP  CAD No  anginal symptoms Followed by Dr Court   Follow up in the AF clinic in one month.    Daril Kicks PA-C Afib [facility] [address] [place], [place] [address] [phone] 06/03/2024 10:30 AM

## 2024-06-04 LAB — BASIC METABOLIC PANEL WITH GFR
BUN/Creatinine Ratio: 19 (ref 12–28)
BUN: 19 mg/dL (ref 8–27)
CO2: 24 mmol/L (ref 20–29)
Calcium: 9.8 mg/dL (ref 8.7–10.3)
Chloride: 99 mmol/L (ref 96–106)
Creatinine, Ser: 1 mg/dL (ref 0.57–1.00)
Glucose: 59 mg/dL — ABNORMAL LOW (ref 70–99)
Potassium: 4.2 mmol/L (ref 3.5–5.2)
Sodium: 142 mmol/L (ref 134–144)
eGFR: 57 mL/min/1.73 — ABNORMAL LOW (ref >59)

## 2024-06-04 LAB — MAGNESIUM: Magnesium: 2.1 mg/dL (ref 1.6–2.3)

## 2024-06-06 ENCOUNTER — Ambulatory Visit (HOSPITAL_COMMUNITY): Payer: Self-pay | Admitting: Physician Assistant

## 2024-06-09 ENCOUNTER — Ambulatory Visit (INDEPENDENT_AMBULATORY_CARE_PROVIDER_SITE_OTHER): Admitting: Family Medicine

## 2024-06-09 ENCOUNTER — Other Ambulatory Visit (HOSPITAL_COMMUNITY): Payer: Self-pay | Admitting: Internal Medicine

## 2024-06-09 ENCOUNTER — Other Ambulatory Visit (HOSPITAL_BASED_OUTPATIENT_CLINIC_OR_DEPARTMENT_OTHER): Payer: Self-pay

## 2024-06-09 ENCOUNTER — Encounter (HOSPITAL_BASED_OUTPATIENT_CLINIC_OR_DEPARTMENT_OTHER): Payer: Self-pay | Admitting: Family Medicine

## 2024-06-09 VITALS — BP 144/82 | HR 65 | Temp 98.3°F | Resp 16 | Wt 245.6 lb

## 2024-06-09 DIAGNOSIS — E782 Mixed hyperlipidemia: Secondary | ICD-10-CM

## 2024-06-09 DIAGNOSIS — I4819 Other persistent atrial fibrillation: Secondary | ICD-10-CM

## 2024-06-09 DIAGNOSIS — E039 Hypothyroidism, unspecified: Secondary | ICD-10-CM | POA: Diagnosis not present

## 2024-06-09 MED ORDER — DOFETILIDE 250 MCG PO CAPS
250.0000 ug | ORAL_CAPSULE | Freq: Two times a day (BID) | ORAL | 6 refills | Status: DC
  Filled 2024-06-09: qty 60, 30d supply, fill #0

## 2024-06-09 NOTE — Progress Notes (Unsigned)
 Established Patient Office Visit  Subjective   Patient ID: Shannon Griffin, female    DOB: Jan 04, 1946  Age: 78 y.o. MRN: [id number]  Chief Complaint  Patient presents with   Follow-up    Follow-up     F/u as above.  I'm pleased to hear that she clearly feels better after her Cardiac meds were adjusted.  Unfortunately I still don't see her records from [facility] today, and we'll again request those.    Past Medical History:  Diagnosis Date   Atrial fibrillation (HCC) 06/27/2011   f/by Dr. Court of [facility]   Colonoscopy refused    Hyperlipidemia    Hypothyroid    Mammogram declined    Morbid obesity (HCC)    Multiple thyroid  nodules    known to Endocrine with previous biopsies   OSA on CPAP 10/25/2012   Osteoarthritis    Osteopenia    Repeat Dexascan declined    Outpatient Encounter Medications as of 06/09/2024  Medication Sig   Bacitracin-Polymyxin B (NEOSPORIN EX) Apply 1 Application topically as needed (skin folds).   bisoprolol  (ZEBETA ) 5 MG tablet Take 1 tablet (5 mg total) by mouth at bedtime.   diltiazem  (CARDIZEM ) 30 MG tablet TAKE ONE TABLET BY MOUTH EVERY 4 HOURS AS NEEDED FOR HEART RATE > 100   dofetilide  (TIKOSYN ) 250 MCG capsule Take 1 capsule (250 mcg total) by mouth 2 (two) times daily.   ELIQUIS  5 MG TABS tablet TAKE ONE TABLET BY MOUTH TWICE A DAY   furosemide  (LASIX ) 40 MG tablet Take 1 tablet (40 mg total) by mouth daily.   hydroxypropyl methylcellulose / hypromellose (ISOPTO TEARS / GONIOVISC) 2.5 % ophthalmic solution Place 1 drop into both eyes in the morning and at bedtime.   levothyroxine  (SYNTHROID , LEVOTHROID) 112 MCG tablet Take 112 mcg by mouth See admin instructions. Take 1 tablet (112 mcg) by mouth on Mondays to Saturdays in the morning & skip dose on Sundays.   Multiple Vitamins-Minerals (MULTIVITAMIN WITH MINERALS) tablet Take 1 tablet by mouth daily after lunch.   Zinc Oxide (DESITIN EX) Apply 1 Application topically as needed  (Irritation).   No facility-administered encounter medications on file as of 06/09/2024.    Social History   Tobacco Use   Smoking status: Never   Smokeless tobacco: Never  Substance Use Topics   Alcohol use: No   Drug use: No    {History (Optional):23778}  Review of Systems  Constitutional:  Negative for diaphoresis, fever, malaise/fatigue and weight loss.  Respiratory:  Negative for cough, shortness of breath and wheezing.   Cardiovascular:  Negative for chest pain, palpitations, orthopnea, claudication, leg swelling and PND.      Objective:     BP (!) 144/82 (BP Location: Right Arm, Patient Position: Standing, Cuff Size: Normal)   Pulse 65   Temp 98.3 F (36.8 C) (Oral)   Resp 16   Wt 245 lb 9.6 oz (111.4 kg)   SpO2 96%   BMI 46.41 kg/m  {Vitals History (Optional):23777}  Physical Exam Constitutional:      General: She is not in acute distress.    Appearance: Normal appearance. She is obese.  HENT:     Head: Normocephalic.  Neck:     Vascular: No carotid bruit.  Cardiovascular:     Rate and Rhythm: Normal rate and regular rhythm.     Pulses: Normal pulses.     Heart sounds: Normal heart sounds.  Pulmonary:     Effort: Pulmonary effort is  normal.     Breath sounds: Normal breath sounds.  Abdominal:     General: Bowel sounds are normal.     Palpations: Abdomen is soft.  Musculoskeletal:     Cervical back: Neck supple. No tenderness.     Right lower leg: No edema.     Left lower leg: No edema.  Skin:    Comments: No lesions noted above the waist.  Neurological:     Mental Status: She is alert.      No results found for any visits on 06/09/24.  {Labs (Optional):23779}  The 10-year ASCVD risk score (Arnett DK, et al., 2019) is: 36.7%    Assessment & Plan:  Primary hypothyroidism  Persistent atrial fibrillation (HCC)  Morbid obesity (HCC)  Mixed hyperlipidemia    No follow-ups on file.    REDDING PONCE NORLEEN FALCON., MD

## 2024-06-10 NOTE — Assessment & Plan Note (Signed)
 Discussed lower fat diet and low impact exercising again.  She has no interest in possible weight loss rx at this time.

## 2024-06-10 NOTE — Assessment & Plan Note (Signed)
 Await records including last TSH, etc.  Clinically compensated and I'm pleased to see her feeling well.  Work harder on weight loss.

## 2024-06-10 NOTE — Assessment & Plan Note (Signed)
 Continue statin

## 2024-06-10 NOTE — Assessment & Plan Note (Signed)
 Stable.  F/u with [facility] as directed.

## 2024-06-21 ENCOUNTER — Other Ambulatory Visit: Payer: Self-pay

## 2024-06-22 MED ORDER — FUROSEMIDE 40 MG PO TABS: 40.0000 mg | ORAL_TABLET | Freq: Every day | ORAL | 3 refills | Status: AC

## 2024-06-28 ENCOUNTER — Telehealth (HOSPITAL_COMMUNITY): Payer: Self-pay | Admitting: *Deleted

## 2024-06-28 MED ORDER — BISOPROLOL FUMARATE 5 MG PO TABS: ORAL_TABLET | ORAL | 3 refills | Status: AC

## 2024-06-28 NOTE — Telephone Encounter (Signed)
 Called today stating for the last week she has had an increased amount of breakthrough. She is taking the PRN cardizem  which will lower the rates but they trend back up. She feels it lasts a good portion of the day. She takes her bisoprolol  at night and its not until 9-10am when she starts having issues af wise. She states her HRs in NSR are in the upper 50s (no symptoms from this).  Discussed with Thom Heinrich PA will increase bisoprolol  to 2.5mg  in the AM and 5mg  in the PM -- monitor heart rates and call with update of response within the next week. Pt in agreement.

## 2024-07-05 ENCOUNTER — Telehealth: Payer: Self-pay | Admitting: *Deleted

## 2024-07-05 DIAGNOSIS — I10 Essential (primary) hypertension: Secondary | ICD-10-CM

## 2024-07-05 DIAGNOSIS — G4733 Obstructive sleep apnea (adult) (pediatric): Secondary | ICD-10-CM

## 2024-07-05 DIAGNOSIS — I4819 Other persistent atrial fibrillation: Secondary | ICD-10-CM

## 2024-07-05 NOTE — Telephone Encounter (Signed)
 Shlomo Wilbert SAUNDERS, MD  Maybell Hun; [place] Phone Number: [phone]   Brad I think this patient will need a split night sleep study before insurance will pay for a new device       Previous Messages    ----- Message ----- From: Maybell Hun Sent: 06/10/2024   3:13 PM EDT To: Wilbert SAUNDERS Shlomo, MD Subject: cpap order                                    I received a order for this patient for a new cpap however the study I have is from 08/06/2012 I need the chart notes from before that study I could not find the notes that mention a study that old without those the patient will require a new study

## 2024-07-05 NOTE — Telephone Encounter (Signed)
**Note De-Identified Shannon Griffin Obfuscation** Per the Traditional Medicare Part A and B website, a PA is not required for CPT Code: 04188 (Split Night Sleep Study).  I called the pt to advise her that Dr Shlomo recommends that she have a Split Night Sleep Study in order for insurance to pay for a new CPAP machine.  The pt stated that she has already received her new CPAP machine from [facility] on 06/14/24 and that she has a virtual Compliance visit scheduled with Dr Shlomo on 08/30/24 at 9:40 am.  I advised the pt that I will forward this message to Dr Shlomo as an RICK.

## 2024-07-07 ENCOUNTER — Ambulatory Visit (HOSPITAL_COMMUNITY)
Admission: RE | Admit: 2024-07-07 | Discharge: 2024-07-07 | Disposition: A | Discharge status: Home / Self Care | Source: Ambulatory Visit | Attending: Physician Assistant | Admitting: Physician Assistant

## 2024-07-07 ENCOUNTER — Encounter (HOSPITAL_COMMUNITY): Payer: Self-pay | Admitting: Physician Assistant

## 2024-07-07 VITALS — BP 112/90 | HR 141 | Ht 61.0 in | Wt 240.0 lb

## 2024-07-07 DIAGNOSIS — Z5181 Encounter for therapeutic drug level monitoring: Secondary | ICD-10-CM | POA: Diagnosis not present

## 2024-07-07 DIAGNOSIS — D6869 Other thrombophilia: Secondary | ICD-10-CM | POA: Insufficient documentation

## 2024-07-07 DIAGNOSIS — Z79899 Other long term (current) drug therapy: Secondary | ICD-10-CM | POA: Diagnosis not present

## 2024-07-07 DIAGNOSIS — I4819 Other persistent atrial fibrillation: Secondary | ICD-10-CM | POA: Diagnosis not present

## 2024-07-07 MED ORDER — AMIODARONE HCL 200 MG PO TABS
ORAL_TABLET | ORAL | 3 refills | Status: DC
Start: 2024-07-07 — End: 2024-09-06

## 2024-07-07 NOTE — Patient Instructions (Addendum)
 STOP Dofetilide  (Tikosyn )   On Sunday evening -- September 21st - Start Amiodarone  200mg  twice a day for 30 days then reduce to once a day -- take with food

## 2024-07-07 NOTE — Progress Notes (Signed)
 Primary Care Physician: Dottie Norleen PHEBE PONCE, MD Primary Cardiologist: Dr Court Primary Electrophysiologist: Dr Cindie Referring Physician: [facility]   Shannon Griffin is a 78 y.o. female with a history of HLD, HTN, hypothyroid, OSA, and atrial fibrillation who presents for follow up in the [facility]. The patient was initially diagnosed with atrial fibrillation in 2012 and had a DCCV in 2015. Patient is on Eliquis  for stroke prevention. S/p DCCV on 12/23/23. Noted in serial cardiology visits 4/15 and 5/22 to have had ERAF (persistent). Started on diltiazem  120 mg daily. No missed doses of Eliquis . Patient notes to be tired. She is s/p dofetilide  loading 8/5-05/27/24. She converted chemically and did not require DCCV.   Patient returns for follow up for atrial fibrillation and dofetilide  monitoring. Patient called the clinic 06/28/24 with tachypalpitations and her bisoprolol  was increased. Unfortunately, she has continued to have episodes of rapid afib every 1-2 days. She monitors with her Kardia mobile. No bleeding issues on anticoagulation.   Today, she  denies symptoms of chest pain, orthopnea, PND, lower extremity edema, dizziness, presyncope, syncope, snoring, daytime somnolence, bleeding, or neurologic sequela. The patient is tolerating medications without difficulties and is otherwise without complaint today.    Atrial Fibrillation Risk Factors:  she does have symptoms or diagnosis of sleep apnea. she does not have a history of rheumatic fever. she does not have a history of alcohol use. The patient does have a history of early familial atrial fibrillation or other arrhythmias. Brother had afib.   Atrial Fibrillation Management history:  Previous antiarrhythmic drugs: dofetilide   Previous cardioversions: 2015, 12/23/23 Previous ablations: none Anticoagulation history: Eliquis    Past Medical History:  Diagnosis Date   Atrial fibrillation (HCC)  06/27/2011   f/by Dr. Court of [facility]   Colonoscopy refused    Hyperlipidemia    Hypothyroid    Mammogram declined    Morbid obesity (HCC)    Multiple thyroid  nodules    known to Endocrine with previous biopsies   OSA on CPAP 10/25/2012   Osteoarthritis    Osteopenia    Repeat Dexascan declined     Current Outpatient Medications  Medication Sig Dispense Refill   amiodarone  (PACERONE ) 200 MG tablet Take 1 tablet (200 mg total) by mouth 2 (two) times daily for 30 days, THEN 1 tablet (200 mg total) daily. 60 tablet 3   Bacitracin-Polymyxin B (NEOSPORIN EX) Apply 1 Application topically as needed (skin folds).     bisoprolol  (ZEBETA ) 5 MG tablet Take 2.5mg  in the AM and 5mg  in the PM 45 tablet 3   diltiazem  (CARDIZEM ) 30 MG tablet TAKE ONE TABLET BY MOUTH EVERY 4 HOURS AS NEEDED FOR HEART RATE > 100 30 tablet 1   ELIQUIS  5 MG TABS tablet TAKE ONE TABLET BY MOUTH TWICE A DAY 180 tablet 1   furosemide  (LASIX ) 40 MG tablet Take 1 tablet (40 mg total) by mouth daily. 90 tablet 3   hydroxypropyl methylcellulose / hypromellose (ISOPTO TEARS / GONIOVISC) 2.5 % ophthalmic solution Place 1 drop into both eyes in the morning and at bedtime.     levothyroxine  (SYNTHROID , LEVOTHROID) 112 MCG tablet Take 112 mcg by mouth See admin instructions. Take 1 tablet (112 mcg) by mouth on Mondays to Saturdays in the morning & skip dose on Sundays.     Multiple Vitamins-Minerals (MULTIVITAMIN WITH MINERALS) tablet Take 1 tablet by mouth daily after lunch.     Zinc Oxide (DESITIN EX) Apply 1  Application topically as needed (Irritation).     No current facility-administered medications for this encounter.    Allergies  Allergen Reactions   Atorvastatin  Other (See Comments)    Joint/muscle aches   Repatha  [Evolocumab ] Other (See Comments)    Pain in the joints Head Aches Throat pain Pressure from the clavicle and up   Freeze It [Camphor-Menthol] Swelling    Histofreeze   Lidocaine Swelling   Tape      It melts the skin and causes sores   ROS- All systems are reviewed and negative except as per the HPI above.  Physical Exam: Vitals:   07/07/24 1118  BP: (!) 112/90  Pulse: (!) 141  Weight: 108.9 kg  Height: 5' 1 (1.549 m)    GEN: Well nourished, well developed in no acute distress CARDIAC: Irregularly irregular rate and rhythm, no murmurs, rubs, gallops RESPIRATORY:  Clear to auscultation without rales, wheezing or rhonchi  ABDOMEN: Soft, non-tender, non-distended EXTREMITIES:  No edema; No deformity    Wt Readings from Last 3 Encounters:  07/07/24 108.9 kg  06/09/24 111.4 kg  06/03/24 110.7 kg    EKG today demonstrates  Afib with RVR, PVC Vent. rate 141 BPM PR interval * ms QRS duration 78 ms QT/QTcB 392/600 ms   Echo 04/20/24 demonstrated   1. Left ventricular ejection fraction, by estimation, is 60 to 65%. Left  ventricular ejection fraction by 3D volume is 61 %. The left ventricle has  normal function. The left ventricle has no regional wall motion  abnormalities. Left ventricular diastolic function could not be evaluated.   2. Right ventricular systolic function is normal. The right ventricular  size is normal. There is mildly elevated pulmonary artery systolic  pressure. The estimated right ventricular systolic pressure is 38.2 mmHg.   3. Left atrial size was mild to moderately dilated.   4. Right atrial size was moderately dilated.   5. The mitral valve is normal in structure. Mild mitral valve  regurgitation. No evidence of mitral stenosis.   6. Tricuspid valve regurgitation is mild to moderate.   7. The aortic valve is normal in structure. Aortic valve regurgitation is  not visualized. No aortic stenosis is present.   8. Aortic dilatation noted. There is dilatation of the ascending aorta,  measuring 38 mm.   9. The inferior vena cava is dilated in size with >50% respiratory  variability, suggesting right atrial pressure of 8 mmHg.   Epic records are  reviewed at length today   CHA2DS2-VASc Score = 4  The patient's score is based upon: CHF History: 0 HTN History: 1 Diabetes History: 0 Stroke History: 0 Vascular Disease History: 0 Age Score: 2 Gender Score: 1       ASSESSMENT AND PLAN: Persistent Atrial Fibrillation (ICD10:  I48.19) The patient's CHA2DS2-VASc score is 4, indicating a 4.8% annual risk of stroke.   S/p dofetilide  admission 05/2024. She appears to be failing dofetilide  with frequent episodes of afib. We discussed rhythm control options today. After discussing the risks and benefits, will stop dofetilide  and start amiodarone  200 mg BID x 4 weeks then decrease to once daily after a 3 day washout of dofetilide .  Continue bisoprolol  2.5 mg AM and 5 mg PM Continue Eliquis  5 mg BID Continue diltiazem  30 mg PRN q 4 hours for heart racing.   Secondary Hypercoagulable State (ICD10:  D68.69) The patient is at significant risk for stroke/thromboembolism based upon her CHA2DS2-VASc Score of 4.  Continue Apixaban  (Eliquis ). No bleeding issues.  High Risk Medication Monitoring (ICD 10: J342684) Patient requires ongoing monitoring for anti-arrhythmic medication which has the potential to cause life threatening arrhythmias. Stop dofetilide  and start amiodarone  as above.    Obesity Body mass index is 45.35 kg/m.  Encouraged lifestyle modification We also discussed referral to [facility] or bariatric surgery. Patient declined for now.   OSA  Encouraged nightly CPAP  CAD No anginal symptoms Followed by Dr Court   Follow up in the AF clinic in 2 weeks for ECG.    Daril Kicks PA-C [facility] [address] [place], [place] [address] [phone] 07/07/2024 12:06 PM

## 2024-07-25 ENCOUNTER — Ambulatory Visit (HOSPITAL_COMMUNITY)
Admission: RE | Admit: 2024-07-25 | Discharge: 2024-07-25 | Disposition: A | Source: Ambulatory Visit | Attending: Physician Assistant | Admitting: Physician Assistant

## 2024-07-25 VITALS — HR 60

## 2024-07-25 DIAGNOSIS — I4891 Unspecified atrial fibrillation: Secondary | ICD-10-CM | POA: Insufficient documentation

## 2024-07-25 NOTE — Progress Notes (Signed)
 Patient returns for ECG after starting amiodarone . ECG shows:  SR, PACs Vent. rate 60 BPM PR interval 126 ms QRS duration 86 ms QT/QTcB 438/438 ms   She reports that she feels better since starting amiodarone , has more energy. She did have rapid afib for several days after starting but she chemically converted. Kardia mobile for home monitoring. F/u in the AF clinic in 3 months.

## 2024-07-26 ENCOUNTER — Telehealth (HOSPITAL_BASED_OUTPATIENT_CLINIC_OR_DEPARTMENT_OTHER): Payer: Self-pay | Admitting: Family Medicine

## 2024-07-26 NOTE — Telephone Encounter (Signed)
 Copied from CRM #[id number]. Topic: Clinical - Medication Question >> Jul 26, 2024 10:26 AM Travis F wrote: Reason for CRM: Patient is calling in because she is requesting a prescription for Meclizine due to her allergies and draining from her nose. Patient is requesting it be sent to [facility] in the building for delivery. She is also requesting the pharmacy send tissues as well.

## 2024-07-27 ENCOUNTER — Encounter (HOSPITAL_BASED_OUTPATIENT_CLINIC_OR_DEPARTMENT_OTHER): Payer: Self-pay | Admitting: *Deleted

## 2024-07-27 ENCOUNTER — Ambulatory Visit (HOSPITAL_BASED_OUTPATIENT_CLINIC_OR_DEPARTMENT_OTHER)

## 2024-07-27 ENCOUNTER — Encounter (HOSPITAL_BASED_OUTPATIENT_CLINIC_OR_DEPARTMENT_OTHER): Payer: Self-pay

## 2024-07-27 VITALS — BP 123/70 | Ht 61.0 in | Wt 242.8 lb

## 2024-07-27 DIAGNOSIS — Z0000 Encounter for general adult medical examination without abnormal findings: Secondary | ICD-10-CM

## 2024-07-27 NOTE — Patient Instructions (Signed)
 Shannon Griffin,  Thank you for taking the time for your Medicare Wellness Visit. I appreciate your continued commitment to your health goals. Please review the care plan we discussed, and feel free to reach out if I can assist you further.  Medicare recommends these wellness visits once per year to help you and your care team stay ahead of potential health issues. These visits are designed to focus on prevention, allowing your provider to concentrate on managing your acute and chronic conditions during your regular appointments.  Please note that Annual Wellness Visits do not include a physical exam. Some assessments may be limited, especially if the visit was conducted virtually. If needed, we may recommend a separate in-person follow-up with your provider.  Ongoing Care Seeing your primary care provider every 3 to 6 months helps us  monitor your health and provide consistent, personalized care.   Referrals If a referral was made during today's visit and you haven't received any updates within two weeks, please contact the referred provider directly to check on the status.  Recommended Screenings:  Health Maintenance  Topic Date Due   Hepatitis C Screening  Never done   Zoster (Shingles) Vaccine (1 of 2) Never done   Pneumococcal Vaccine for age over 60 (1 of 1 - PCV) Never done   DEXA scan (bone density measurement)  Never done   Flu Shot  Never done   DTaP/Tdap/Td vaccine (1 - Tdap) 06/09/2025*   Medicare Annual Wellness Visit  07/27/2025   Meningitis B Vaccine  Aged Out   COVID-19 Vaccine  Discontinued  *Topic was postponed. The date shown is not the original due date.       07/27/2024    1:47 PM  Advanced Directives  Does Patient Have a Medical Advance Directive? Yes  Type of Estate agent of [place];Living will  Does patient want to make changes to medical advance directive? No - Patient declined  Copy of Healthcare Power of Attorney in Chart? Yes -  validated most recent copy scanned in chart (See row information)   Advance Care Planning is important because it: Ensures you receive medical care that aligns with your values, goals, and preferences. Provides guidance to your family and loved ones, reducing the emotional burden of decision-making during critical moments.  Vision: Annual vision screenings are recommended for early detection of glaucoma, cataracts, and diabetic retinopathy. These exams can also reveal signs of chronic conditions such as diabetes and high blood pressure.  Dental: Annual dental screenings help detect early signs of oral cancer, gum disease, and other conditions linked to overall health, including heart disease and diabetes.  Please see the attached documents for additional preventive care recommendations.

## 2024-07-27 NOTE — Progress Notes (Signed)
 Because this visit was a virtual/telehealth visit,  certain criteria was not obtained, such a blood pressure, CBG if applicable, and timed get up and go. Any medications not marked as taking were not mentioned during the medication reconciliation part of the visit. Any vitals not documented were not able to be obtained due to this being a telehealth visit or patient was unable to self-report a recent blood pressure reading due to a lack of equipment at home via telehealth. Vitals that have been documented are verbally provided by the patient.   This visit was performed by a medical professional under my direct supervision. I was immediately available for consultation/collaboration. I have reviewed and agree with the Annual Wellness Visit documentation.  Subjective:   Shannon Griffin is a 78 y.o. who presents for a Medicare Wellness preventive visit.  As a reminder, Annual Wellness Visits don't include a physical exam, and some assessments may be limited, especially if this visit is performed virtually. We may recommend an in-person follow-up visit with your provider if needed.  Visit Complete: Virtual I connected with  Nashika Coker Cubero on 07/27/24 by a video and audio enabled telemedicine application and verified that I am speaking with the correct person using two identifiers.  Patient Location: Home  Provider Location: Home Office  I discussed the limitations of evaluation and management by telemedicine. The patient expressed understanding and agreed to proceed.  Vital Signs: Because this visit was a virtual/telehealth visit, some criteria may be missing or patient reported. Any vitals not documented were not able to be obtained and vitals that have been documented are patient reported.   Persons Participating in Visit: Patient.  AWV Questionnaire: No: Patient Medicare AWV questionnaire was not completed prior to this visit.  Cardiac Risk Factors include: advanced age (>66men,  >45 women);obesity (BMI >30kg/m2);Other (see comment);hypertension;dyslipidemia, Risk factor comments: awv     Objective:    Today's Vitals   07/27/24 1348 07/27/24 1350  BP: 123/70   Weight: 242 lb 12.8 oz (110.1 kg)   Height: 5' 1 (1.549 m)   PainSc:  4    Body mass index is 45.88 kg/m.     07/27/2024    1:47 PM 05/24/2024   12:25 PM 12/23/2023    7:52 AM 12/04/2018    9:27 PM  Advanced Directives  Does Patient Have a Medical Advance Directive? Yes Yes Yes Yes   Type of Estate agent of [place];Living will Living will;Healthcare Power of [employer] Power of [place];Living will;Out of facility DNR (pink MOST or yellow form) Living will  Does patient want to make changes to medical advance directive? No - Patient declined No - Patient declined No - Patient declined No - Patient declined   Copy of Healthcare Power of Attorney in Chart? Yes - validated most recent copy scanned in chart (See row information) No - copy requested No - copy requested No - copy requested   Would patient like information on creating a medical advance directive?    No - Patient declined      Data saved with a previous flowsheet row definition    Current Medications (verified) Outpatient Encounter Medications as of 07/27/2024  Medication Sig   amiodarone  (PACERONE ) 200 MG tablet Take 1 tablet (200 mg total) by mouth 2 (two) times daily for 30 days, THEN 1 tablet (200 mg total) daily.   Bacitracin-Polymyxin B (NEOSPORIN EX) Apply 1 Application topically as needed (skin folds).   bisoprolol  (ZEBETA ) 5 MG tablet Take  2.5mg  in the AM and 5mg  in the PM   diltiazem  (CARDIZEM ) 30 MG tablet TAKE ONE TABLET BY MOUTH EVERY 4 HOURS AS NEEDED FOR HEART RATE > 100   ELIQUIS  5 MG TABS tablet TAKE ONE TABLET BY MOUTH TWICE A DAY   furosemide  (LASIX ) 40 MG tablet Take 1 tablet (40 mg total) by mouth daily.   hydroxypropyl methylcellulose / hypromellose (ISOPTO TEARS / GONIOVISC) 2.5 %  ophthalmic solution Place 1 drop into both eyes in the morning and at bedtime.   levothyroxine  (SYNTHROID , LEVOTHROID) 112 MCG tablet Take 112 mcg by mouth See admin instructions. Take 1 tablet (112 mcg) by mouth on Mondays to Saturdays in the morning & skip dose on Sundays.   Multiple Vitamins-Minerals (MULTIVITAMIN WITH MINERALS) tablet Take 1 tablet by mouth daily after lunch.   Zinc Oxide (DESITIN EX) Apply 1 Application topically as needed (Irritation).   No facility-administered encounter medications on file as of 07/27/2024.    Allergies (verified) Atorvastatin , Repatha  [evolocumab ], Freeze it [camphor-menthol], Lidocaine, and Tape   History: Past Medical History:  Diagnosis Date   Atrial fibrillation (HCC) 06/27/2011   f/by Dr. Court [facility]   Colonoscopy refused    Hyperlipidemia    Hypothyroid    Mammogram declined    Morbid obesity (HCC)    Multiple thyroid  nodules    known to Endocrine with previous biopsies   OSA on CPAP 10/25/2012   Osteoarthritis    Osteopenia    Repeat Dexascan declined   Past Surgical History:  Procedure Laterality Date   CARDIOVERSION N/A 12/23/2023   Procedure: CARDIOVERSION;  Surgeon: Sheena Pugh, DO;  Location: [facility];  Service: Cardiovascular;  Laterality: N/A;   CHOLECYSTECTOMY     TONSILLECTOMY     Family History  Problem Relation Age of Onset   Atrial fibrillation Mother    Atrial fibrillation Brother    Social History   Socioeconomic History   Marital status: Widowed    Spouse name: Not on file   Number of children: Not on file   Years of education: Not on file   Highest education level: Associate degree: occupational, Scientist, product/process development, or vocational program  Occupational History   Not on file  Tobacco Use   Smoking status: Never   Smokeless tobacco: Never   Tobacco comments:    Never smoke 07/07/24  Substance and Sexual Activity   Alcohol use: No   Drug use: No   Sexual activity: Never  Other Topics  Concern   Not on file  Social History Narrative   Not on file   Social Drivers of Health   Financial Resource Strain: Medium Risk (07/27/2024)   Overall Financial Resource Strain (CARDIA)    Difficulty of Paying Living Expenses: Somewhat hard  Food Insecurity: No Food Insecurity (07/27/2024)   Hunger Vital Sign    Worried About Running Out of Food in the Last Year: Never true    Ran Out of Food in the Last Year: Never true  Transportation Needs: No Transportation Needs (07/27/2024)   PRAPARE - Administrator, Civil Service (Medical): No    Lack of Transportation (Non-Medical): No  Physical Activity: Insufficiently Active (07/27/2024)   Exercise Vital Sign    Days of Exercise per Week: 4 days    Minutes of Exercise per Session: 20 min  Stress: Stress Concern Present (07/27/2024)   [employer] of Occupational Health - Occupational Stress Questionnaire    Feeling of Stress: To some extent  Social Connections: Moderately Isolated (07/27/2024)   Social Connection and Isolation Panel    Frequency of Communication with Friends and Family: More than three times a week    Frequency of Social Gatherings with Friends and Family: Twice a week    Attends Religious Services: 1 to 4 times per year    Active Member of [employer] or Organizations: No    Attends Banker Meetings: Never    Marital Status: Widowed    Tobacco Counseling Counseling given: Not Answered Tobacco comments: Never smoke 07/07/24    Clinical Intake:  Pre-visit preparation completed: Yes  Pain : 0-10 Pain Score: 4  Pain Type: Chronic pain Pain Location: Back Pain Orientation: Mid, Lower, Upper Pain Descriptors / Indicators: Aching Pain Onset: Today Pain Frequency: Several days a week     BMI - recorded: 45.88 Nutritional Status: BMI > 30  Obese Nutritional Risks: None Diabetes: No  No results found for: HGBA1C   How often do you need to have someone help you when you read  instructions, pamphlets, or other written materials from your doctor or pharmacy?: 1 - Never  Interpreter Needed?: No  Information entered by :: Shareece Bultman,CMA   Activities of Daily Living     07/27/2024    1:53 PM 05/24/2024   12:25 PM  In your present state of health, do you have any difficulty performing the following activities:  Hearing? 0 0  Vision? 0 0  Difficulty concentrating or making decisions? 0 0  Walking or climbing stairs? 1   Dressing or bathing? 0   Doing errands, shopping? 0 0  Preparing Food and eating ? N   Using the Toilet? N   In the past six months, have you accidently leaked urine? Y   Do you have problems with loss of bowel control? N   Managing your Medications? N   Managing your Finances? N   Housekeeping or managing your Housekeeping? N     Patient Care Team: Dottie Norleen PHEBE PONCE, MD as PCP - General (Family Medicine) Court Dorn PARAS, MD as PCP - Cardiology (Cardiology) Tommas Pears, MD as Attending Physician (Endocrinology) Altheimer, Ozell, MD as Consulting Physician (Endocrinology)  I have updated your Care Teams any recent Medical Services you may have received from other providers in the past year.     Assessment:   This is a routine wellness examination for [place].  Hearing/Vision screen Hearing Screening - Comments:: No difficulties Vision Screening - Comments:: Patient wears readers    Goals Addressed             This Visit's Progress    Patient Stated       To lose some weight        Depression Screen     07/27/2024    1:55 PM 05/05/2024    1:18 PM  PHQ 2/9 Scores  PHQ - 2 Score 0 0  PHQ- 9 Score 1 2    Fall Risk     07/27/2024    1:51 PM 05/05/2024    1:18 PM 05/20/2019   11:03 AM 05/08/2017    6:59 PM  Fall Risk   Falls in the past year? 0 0 0  No   Comment   [place] Telephone Survey: data to providers prior to load  [place] Survey: data to providers prior to load   Number falls in past yr: 0 0     Injury with Fall? 0 0    Risk for fall  due to : No Fall Risks No Fall Risks    Follow up Falls evaluation completed Falls evaluation completed       Data saved with a previous flowsheet row definition    MEDICARE RISK AT HOME:  Medicare Risk at Home Any stairs in or around the home?: No If so, are there any without handrails?: No Home free of loose throw rugs in walkways, pet beds, electrical cords, etc?: Yes Adequate lighting in your home to reduce risk of falls?: Yes Life alert?: No Use of a cane, walker or w/c?: Yes Grab bars in the bathroom?: Yes Shower chair or bench in shower?: Yes Elevated toilet seat or a handicapped toilet?: Yes  TIMED UP AND GO:  Was the test performed?  No  Cognitive Function: 6CIT completed        07/27/2024    1:56 PM  6CIT Screen  What Year? 0 points  What month? 0 points  What time? 0 points  Count back from 20 0 points  Months in reverse 0 points  Repeat phrase 0 points  Total Score 0 points    Immunizations  There is no immunization history on file for this patient.  Screening Tests Health Maintenance  Topic Date Due   Hepatitis C Screening  Never done   Zoster Vaccines- Shingrix (1 of 2) Never done   Pneumococcal Vaccine: 50+ Years (1 of 1 - PCV) Never done   DEXA SCAN  Never done   Influenza Vaccine  Never done   DTaP/Tdap/Td (1 - Tdap) 06/09/2025 (Originally 02/10/1965)   Medicare Annual Wellness (AWV)  07/27/2025   Meningococcal B Vaccine  Aged Out   COVID-19 Vaccine  Discontinued    Health Maintenance Items Addressed:patient declined   Additional Screening:  Vision Screening: Recommended annual ophthalmology exams for early detection of glaucoma and other disorders of the eye. Is the patient up to date with their annual eye exam?  No  Who is the provider or what is the name of the office in which the patient attends annual eye exams?   Dental Screening: Recommended annual dental exams for proper oral  hygiene  Community Resource Referral / Chronic Care Management: CRR required this visit?  No   CCM required this visit?  No   Plan:    I have personally reviewed and noted the following in the patient's chart:   Medical and social history Use of alcohol, tobacco or illicit drugs  Current medications and supplements including opioid prescriptions. Patient is not currently taking opioid prescriptions. Functional ability and status Nutritional status Physical activity Advanced directives List of other physicians Hospitalizations, surgeries, and ER visits in previous 12 months Vitals Screenings to include cognitive, depression, and falls Referrals and appointments  In addition, I have reviewed and discussed with patient certain preventive protocols, quality metrics, and best practice recommendations. A written personalized care plan for preventive services as well as general preventive health recommendations were provided to patient.   Lyle MARLA Right, [place]   07/27/2024   After Visit Summary: (MyChart) Due to this being a telephonic visit, the after visit summary with patients personalized plan was offered to patient via MyChart   Notes: Nothing significant to report at this time.

## 2024-08-05 ENCOUNTER — Other Ambulatory Visit (HOSPITAL_BASED_OUTPATIENT_CLINIC_OR_DEPARTMENT_OTHER): Payer: Self-pay | Admitting: Family Medicine

## 2024-08-05 NOTE — Telephone Encounter (Unsigned)
 Copied from CRM [id number]. Topic: Clinical - Medication Refill >> Aug 05, 2024 11:48 AM Roselie BROCKS wrote: Medication: levothyroxine  (SYNTHROID , LEVOTHROID) 112 MCG tablet  Dispense at written ,can not be generic  Has the patient contacted their pharmacy? Yes (Agent: If no, request that the patient contact the pharmacy for the refill. If patient does not wish to contact the pharmacy document the reason why and proceed with request.) (Agent: If yes, when and what did the pharmacy advise?)  This is the patient's preferred pharmacy:  [facility] [place], [place] - [address] [place] [address] Phone: [phone] Fax: [phone]    Is this the correct pharmacy for this prescription? Yes If no, delete pharmacy and type the correct one.   Has the prescription been filled recently? No  Is the patient out of the medication? No  Has the patient been seen for an appointment in the last year OR does the patient have an upcoming appointment? Yes  Can we respond through MyChart? Yes  Agent: Please be advised that Rx refills may take up to 3 business days. We ask that you follow-up with your pharmacy.

## 2024-08-08 MED ORDER — LEVOTHYROXINE SODIUM 112 MCG PO TABS: 112.0000 ug | ORAL_TABLET | ORAL | 0 refills | Status: AC

## 2024-08-23 ENCOUNTER — Ambulatory Visit: Payer: Self-pay | Admitting: Cardiology

## 2024-08-24 ENCOUNTER — Telehealth: Payer: Self-pay

## 2024-08-24 NOTE — Telephone Encounter (Signed)
 SABRA

## 2024-08-29 NOTE — Telephone Encounter (Signed)
 Yes, please still make your appointment on Tuesday.

## 2024-08-30 ENCOUNTER — Telehealth: Payer: Self-pay | Admitting: *Deleted

## 2024-08-30 ENCOUNTER — Ambulatory Visit: Attending: Cardiology | Admitting: Cardiology

## 2024-08-30 ENCOUNTER — Encounter: Payer: Self-pay | Admitting: Cardiology

## 2024-08-30 VITALS — BP 122/62 | HR 115 | Ht 61.5 in | Wt 245.4 lb

## 2024-08-30 DIAGNOSIS — G4733 Obstructive sleep apnea (adult) (pediatric): Secondary | ICD-10-CM | POA: Insufficient documentation

## 2024-08-30 DIAGNOSIS — I10 Essential (primary) hypertension: Secondary | ICD-10-CM | POA: Diagnosis present

## 2024-08-30 DIAGNOSIS — I4819 Other persistent atrial fibrillation: Secondary | ICD-10-CM

## 2024-08-30 NOTE — Patient Instructions (Signed)
 Medication Instructions:  Your physician recommends that you continue on your current medications as directed. Please refer to the Current Medication list given to you today.  *If you need a refill on your cardiac medications before your next appointment, please call your pharmacy*  Lab Work: None.  If you have labs (blood work) drawn today and your tests are completely normal, you will receive your results only by: MyChart Message (if you have MyChart) OR A paper copy in the mail If you have any lab test that is abnormal or we need to change your treatment, we will call you to review the results.  Testing/Procedures: None.  Follow-Up: Good luck with your move!

## 2024-08-30 NOTE — Telephone Encounter (Signed)
-----   Message from Wilbert Bihari sent at 08/30/2024  9:49 AM EST ----- Please order an under the nose FFM and I want her to go to the DME for the fitting

## 2024-08-30 NOTE — Progress Notes (Signed)
 Sleep medicine office visit note  Date:  08/30/2024   ID:  Shannon Griffin [place], DOB [birth year], MRN [id number]  PCP:  Dottie Norleen PHEBE PONCE, MD   [facility] Providers Cardiologist:  Dorn Lesches, MD     Evaluation Performed:  New Patient Evaluation  Chief Complaint:  OSA  History of Present Illness:    Shannon Griffin is a 78 y.o. female with a history of atrial fibrillation, hyperlipidemia and obstructive sleep apnea.  The patient was initially diagnosed with obstructive sleep apnea in 2013 with a sleep study showing AHI of 6.2/h and moderate during REM sleep with a REM AHI of 23/h.  CPAP was initiated at 9 cm H2O.  She received a new ResMed AirSense 10 CPAP unit in April 2019 and DME company is [facility] in [place].  At last office visit her machine and hit end-of-life so we ordered her a new ResMed AirSense 11 CPAP at 9 cm H2O.  She is now back for follow-up per insurance requirements to document compliance.  She is here today for followup and is doing well but does not like her new PAP device.  It is hard to open the tank to put water in and also does not hold as much H2O. She tolerates the nasal pillow mask and feels the pressure is adequate.  Since going on PAP she feels rested in the am and has no significant daytime sleepiness.  She is having a lot of mouth and nasal dryness but is using a nasal mask and could not use the chin strap because it broker her face out.   She does not think that he snores. An Epworth Sleepiness Scale score was calculated the office today and this endorsed at 3 arguing against residual daytime sleepiness. Patient denies any episodes of bruxism, restless legs, No gagging hallucinations or cataplectic events.    Past Medical History:  Diagnosis Date   Atrial fibrillation (HCC) 06/27/2011   f/by Dr. Lesches of [facility]   Colonoscopy refused    Hyperlipidemia    Hypothyroid    Mammogram declined    Morbid obesity (HCC)     Multiple thyroid  nodules    known to Endocrine with previous biopsies   OSA on CPAP 10/25/2012   Osteoarthritis    Osteopenia    Repeat Dexascan declined   Past Surgical History:  Procedure Laterality Date   CARDIOVERSION N/A 12/23/2023   Procedure: CARDIOVERSION;  Surgeon: Sheena Pugh, DO;  Location: [facility];  Service: Cardiovascular;  Laterality: N/A;   CHOLECYSTECTOMY     TONSILLECTOMY       No outpatient medications have been marked as taking for the 08/30/24 encounter (Clinical Support) with Shlomo Wilbert SAUNDERS, MD.     Allergies:   Atorvastatin , Repatha  [evolocumab ], Freeze it [camphor-menthol], Lidocaine, and Tape   Social History   Tobacco Use   Smoking status: Never   Smokeless tobacco: Never   Tobacco comments:    Never smoke 07/07/24  Substance Use Topics   Alcohol use: No   Drug use: No     Family Hx: The patient's family history includes Atrial fibrillation in her brother and mother.  ROS:   Please see the history of present illness.     All other systems reviewed and are negative.   Prior Sleep studies:   The following studies were reviewed today:  PAP compliance download  Labs/Other Tests and Data Reviewed:     Recent Labs: 12/18/2023:  TSH 4.150 02/02/2024: Hemoglobin 14.8; Platelets 198 03/10/2024: ALT 12; BNP 567.4 06/03/2024: BUN 19; Creatinine, Ser 1.00; Magnesium  2.1; Potassium 4.2; Sodium 142    Wt Readings from Last 3 Encounters:  07/27/24 242 lb 12.8 oz (110.1 kg)  07/07/24 240 lb (108.9 kg)  06/09/24 245 lb 9.6 oz (111.4 kg)     Risk Assessment/Calculations:    CHA2DS2-VASc Score = 4   This indicates a 4.8% annual risk of stroke. The patient's score is based upon: CHF History: 0 HTN History: 1 Diabetes History: 0 Stroke History: 0 Vascular Disease History: 0 Age Score: 2 Gender Score: 1         Objective:    Vital Signs:  There were no vitals taken for this visit.  GEN: Well nourished, well developed in no  acute distress HEENT: Normal NECK: No JVD; No carotid bruits LYMPHATICS: No lymphadenopathy CARDIAC:RRR, no murmurs, rubs, gallops RESPIRATORY:  Clear to auscultation without rales, wheezing or rhonchi  ABDOMEN: Soft, non-tender, non-distended MUSCULOSKELETAL:  No edema; No deformity  SKIN: Warm and dry NEUROLOGIC:  Alert and oriented x 3 PSYCHIATRIC:  Normal affect  ASSESSMENT & PLAN:    OSA - The patient is tolerating PAP therapy well without any problems. The PAP download performed by his DME was personally reviewed and interpreted by me today and showed an AHI of 0.9 /hr on 9 cm H2O with 100 % compliance in using more than 4 hours nightly.  The patient has been using and benefiting from PAP use and will continue to benefit from therapy.  -I am going to order her an under the nose FFM to see if she tolerates that better -encouraged her to increase humidity as needed for nasal dryness -instructed her to use a humiditeir in her bedroom if she runs out of H2O in her water chamber  HTN - BP is controlled on exam today -Continue bisoprolol  2.5 mg every morning and 5 mg every afternoon with as needed refills   Time:   Today, I have spent 15 minutes with the patient with telehealth technology discussing the above problems.     Medication Adjustments/Labs and Tests Ordered: Current medicines are reviewed at length with the patient today.  Concerns regarding medicines are outlined above.   Tests Ordered: No orders of the defined types were placed in this encounter.   Medication Changes: No orders of the defined types were placed in this encounter.   Follow Up:  virtual visit in 6 weeks after she gets her new device  Signed, Wilbert Bihari, MD  08/30/2024 9:31 AM    [facility]

## 2024-08-30 NOTE — Telephone Encounter (Signed)
 Order placed to [facility] via fax.

## 2024-09-06 ENCOUNTER — Other Ambulatory Visit (HOSPITAL_COMMUNITY): Payer: Self-pay | Admitting: *Deleted

## 2024-09-06 MED ORDER — AMIODARONE HCL 200 MG PO TABS: 200.0000 mg | ORAL_TABLET | Freq: Every day | ORAL | 2 refills | Status: AC

## 2024-09-26 ENCOUNTER — Encounter: Payer: Self-pay | Admitting: Cardiovascular Disease

## 2024-09-26 ENCOUNTER — Ambulatory Visit: Attending: Cardiovascular Disease | Admitting: Cardiovascular Disease

## 2024-09-26 DIAGNOSIS — I480 Paroxysmal atrial fibrillation: Secondary | ICD-10-CM

## 2024-09-26 DIAGNOSIS — E782 Mixed hyperlipidemia: Secondary | ICD-10-CM

## 2024-09-26 DIAGNOSIS — G4733 Obstructive sleep apnea (adult) (pediatric): Secondary | ICD-10-CM

## 2024-09-26 DIAGNOSIS — I10 Essential (primary) hypertension: Secondary | ICD-10-CM | POA: Diagnosis not present

## 2024-09-26 DIAGNOSIS — R931 Abnormal findings on diagnostic imaging of heart and coronary circulation: Secondary | ICD-10-CM | POA: Diagnosis not present

## 2024-09-26 NOTE — Assessment & Plan Note (Signed)
 History of essential hypertension with blood pressure measured today 131/67.  She is on bisoprolol .

## 2024-09-26 NOTE — Assessment & Plan Note (Signed)
 BMI of 46 ?

## 2024-09-26 NOTE — Assessment & Plan Note (Signed)
 History of PAF status post multiple cardioversions in the past most recently by Dr. Sheena  12/23/2023.  She is symptomatic when she is in A-fib.  She was admitted for a Tikosyn  load 05/24/2024 and did convert to sinus rhythm briefly but were reverted back to A-fib.  She has been placed on amiodarone  and is maintaining sinus rhythm, and feels clinically improved.

## 2024-09-26 NOTE — Progress Notes (Signed)
 09/26/2024 Shannon Griffin   24-Jan-1946  [id number]  Primary Physician Dottie Norleen PHEBE HEATH, MD Primary Cardiologist: Dorn JINNY Lesches MD [facility], [place], [place]  HPI:  Shannon Griffin is a 78 y.o.  moderately overweight widowed Caucasian female mother of one daughter, grandmother and one grandchild who I last saw 01/02/2024.Shannon Griffin She relocated from [place]  to be closer to her family. Her history is remarkable for paroxysmal atrial fibrillation and mild hyperlipidemia as well as obesity and obstructive sleep apnea. She wears C Pap which she benefits from.. She denies chest pain or shortness of breath. She had a 2-D echocardiogram performed in the past that showed normal LV size and function with moderate to severe left atrial enlargement. Since I saw her a year ago she's remained stable. She does work in the yard. She is unable to lose weight however. She gets occasional episodes of palpitations which may be brief though infrequent episodes of PAF currently in sinus rhythm. The CHA2DSVASC2 score is 2  . We discussed the risks and benefits of oral anticoagulation with regard to stroke prophylaxis. She is currently on Eliquis  oral anticoagulation. Since I saw her year ago she's remained asymptomatic specifically denying chest pain. She does have some mild dyspnea on exertion probably multifactorial related to obesity and deconditioning   She did have an episode of A. fib with RVR brought her to the emergency room on 12/05/2018.  She underwent DC cardioversion by Dr. Baxter successfully back to sinus rhythm.  She saw Arland Don, NP in the A. fib clinic several days later in sinus rhythm.  Otherwise, she gets some dyspnea on exertion.  She denies chest pain.  She continues to wear CPAP for her sleep apnea.  She is had little success with weight loss however.    She has occasional episodes of PAF with which resolve quickly and spontaneously.  She really has been unsuccessful with weight loss.   She does wear her CPAP.  She denies chest pain or shortness of breath but does complain of daytime somnolence.  She had a lipid profile performed 10/11/2019 that revealed total cholesterol 207 with an LDL of 135.  She is resistant to starting statin therapy.  I referred her to Dr. Mona.  She was started on Zetia  which was ineffective.  She went on a diet which did not affect her lipid profile.  PCSK9 was raised in her discussion with Dr. Mona which she was hesitant to pursue at that time but now is receptive.   Since I saw her in the office 9 months ago she did have outpatient DC cardioversion by Dr. Sheena 12/23/2023.  She clearly is symptomatic when she is in A-fib.  She was admitted for Tikosyn  load 05/24/2024 and converted to sinus rhythm briefly but then went back and it A-fib.  She since been put on amiodarone  and is maintaining sinus rhythm and is clinically improved.  She is very active and denies chest pain or shortness of breath.  She has seen Dr. Shlomo for sleep apnea.  She is planning on moving to [place]  in the near future.   Current Meds  Medication Sig   amiodarone  (PACERONE ) 200 MG tablet Take 1 tablet (200 mg total) by mouth daily.   Bacitracin-Polymyxin B (NEOSPORIN EX) Apply 1 Application topically as needed (skin folds).   bisoprolol  (ZEBETA ) 5 MG tablet Take 2.5mg  in the AM and 5mg  in the PM   diltiazem  (CARDIZEM ) 30 MG tablet TAKE ONE  TABLET BY MOUTH EVERY 4 HOURS AS NEEDED FOR HEART RATE > 100   ELIQUIS  5 MG TABS tablet TAKE ONE TABLET BY MOUTH TWICE A DAY   furosemide  (LASIX ) 40 MG tablet Take 1 tablet (40 mg total) by mouth daily.   hydroxypropyl methylcellulose / hypromellose (ISOPTO TEARS / GONIOVISC) 2.5 % ophthalmic solution Place 1 drop into both eyes in the morning and at bedtime.   levothyroxine  (SYNTHROID ) 112 MCG tablet Take 1 tablet (112 mcg total) by mouth See admin instructions. Take 1 tablet (112 mcg) by mouth on Mondays to Saturdays in the morning & skip dose  on Sundays.   Multiple Vitamins-Minerals (MULTIVITAMIN WITH MINERALS) tablet Take 1 tablet by mouth daily after lunch.   Zinc Oxide (DESITIN EX) Apply 1 Application topically as needed (Irritation).     Allergies  Allergen Reactions   Atorvastatin  Other (See Comments)    Joint/muscle aches   Repatha  [Evolocumab ] Other (See Comments)    Pain in the joints Head Aches Throat pain Pressure from the clavicle and up   Freeze It [Camphor-Menthol] Swelling    Histofreeze   Lidocaine Swelling   Tape     It melts the skin and causes sores    Social History   Socioeconomic History   Marital status: Widowed    Spouse name: Not on file   Number of children: Not on file   Years of education: Not on file   Highest education level: Associate degree: occupational, scientist, product/process development, or vocational program  Occupational History   Not on file  Tobacco Use   Smoking status: Never   Smokeless tobacco: Never   Tobacco comments:    Never smoke 07/07/24  Substance and Sexual Activity   Alcohol use: No   Drug use: No   Sexual activity: Never  Other Topics Concern   Not on file  Social History Narrative   Not on file   Social Drivers of Health   Financial Resource Strain: Medium Risk (07/27/2024)   Overall Financial Resource Strain (CARDIA)    Difficulty of Paying Living Expenses: Somewhat hard  Food Insecurity: No Food Insecurity (07/27/2024)   Hunger Vital Sign    Worried About Running Out of Food in the Last Year: Never true    Ran Out of Food in the Last Year: Never true  Transportation Needs: No Transportation Needs (07/27/2024)   PRAPARE - Administrator, Civil Service (Medical): No    Lack of Transportation (Non-Medical): No  Physical Activity: Insufficiently Active (07/27/2024)   Exercise Vital Sign    Days of Exercise per Week: 4 days    Minutes of Exercise per Session: 20 min  Stress: Stress Concern Present (07/27/2024)   [employer] of Occupational Health -  Occupational Stress Questionnaire    Feeling of Stress: To some extent  Social Connections: Moderately Isolated (07/27/2024)   Social Connection and Isolation Panel    Frequency of Communication with Friends and Family: More than three times a week    Frequency of Social Gatherings with Friends and Family: Twice a week    Attends Religious Services: 1 to 4 times per year    Active Member of [employer] or Organizations: No    Attends Banker Meetings: Never    Marital Status: Widowed  Intimate Partner Violence: Not At Risk (07/27/2024)   Humiliation, Afraid, Rape, and Kick questionnaire    Fear of Current or Ex-Partner: No    Emotionally Abused: No    Physically Abused:  No    Sexually Abused: No     Review of Systems: General: negative for chills, fever, night sweats or weight changes.  Cardiovascular: negative for chest pain, dyspnea on exertion, edema, orthopnea, palpitations, paroxysmal nocturnal dyspnea or shortness of breath Dermatological: negative for rash Respiratory: negative for cough or wheezing Urologic: negative for hematuria Abdominal: negative for nausea, vomiting, diarrhea, bright red blood per rectum, melena, or hematemesis Neurologic: negative for visual changes, syncope, or dizziness All other systems reviewed and are otherwise negative except as noted above.    Blood pressure 131/67, pulse 65, height 5' 1 (1.549 m), weight 247 lb (112 kg), SpO2 93%.  General appearance: alert and no distress Neck: no adenopathy, no carotid bruit, no JVD, supple, symmetrical, trachea midline, and thyroid  not enlarged, symmetric, no tenderness/mass/nodules Lungs: clear to auscultation bilaterally Heart: regular rate and rhythm, S1, S2 normal, no murmur, click, rub or gallop Extremities: extremities normal, atraumatic, no cyanosis or edema Pulses: 2+ and symmetric Skin: Skin color, texture, turgor normal. No rashes or lesions Neurologic: Grossly normal  EKG not performed  today      ASSESSMENT AND PLAN:   Obesity, morbid, BMI 40.0-49.9 (HCC) BMI of 46  Paroxysmal atrial fibrillation (HCC) History of PAF status post multiple cardioversions in the past most recently by Dr. Sheena  12/23/2023.  She is symptomatic when she is in A-fib.  She was admitted for a Tikosyn  load 05/24/2024 and did convert to sinus rhythm briefly but were reverted back to A-fib.  She has been placed on amiodarone  and is maintaining sinus rhythm, and feels clinically improved.  Hyperlipidemia History of hyperlipidemia intolerant to statin therapy and PCSK9.  Her most recent lipid profile performed 03/29/2024 revealed total cholesterol 240, LDL 158 and HDL 45.  LDL goal is less than 70 given her elevated coronary calcium  score.  Unfortunately, we seem to have run out of options for LDL reduction.  Obstructive sleep apnea History of obstructive sleep apnea followed by Dr. Shlomo on CPAP.  Elevated coronary artery calcium  score Elevated coronary calcium  score of 141 performed 10/26/2019.  She denies chest pain or shortness of breath but unfortunately is not at goal for secondary prevention.  Essential hypertension History of essential hypertension with blood pressure measured today 131/67.  She is on bisoprolol .     Dorn DOROTHA Lesches MD [facility], [facility] 09/26/2024 10:55 AM

## 2024-09-26 NOTE — Assessment & Plan Note (Signed)
 History of hyperlipidemia intolerant to statin therapy and PCSK9.  Her most recent lipid profile performed 03/29/2024 revealed total cholesterol 240, LDL 158 and HDL 45.  LDL goal is less than 70 given her elevated coronary calcium  score.  Unfortunately, we seem to have run out of options for LDL reduction.

## 2024-09-26 NOTE — Assessment & Plan Note (Signed)
 History of obstructive sleep apnea followed by Dr. Shlomo on CPAP.

## 2024-09-26 NOTE — Patient Instructions (Signed)

## 2024-09-26 NOTE — Assessment & Plan Note (Signed)
 Elevated coronary calcium  score of 141 performed 10/26/2019.  She denies chest pain or shortness of breath but unfortunately is not at goal for secondary prevention.

## 2024-09-27 ENCOUNTER — Other Ambulatory Visit: Payer: Self-pay | Admitting: Cardiovascular Disease

## 2024-09-27 DIAGNOSIS — I480 Paroxysmal atrial fibrillation: Secondary | ICD-10-CM

## 2024-09-27 NOTE — Telephone Encounter (Signed)
 Prescription refill request for Eliquis  received. Indication:AFIB Last office visit:12/25 Scr: 1.00  8/25 Age:78 Weight:112  kg  Prescription refilled

## 2024-11-08 ENCOUNTER — Ambulatory Visit (HOSPITAL_COMMUNITY)
Admission: RE | Admit: 2024-11-08 | Discharge: 2024-11-08 | Disposition: A | Discharge status: Home / Self Care | Source: Ambulatory Visit | Attending: Physician Assistant | Admitting: Physician Assistant

## 2024-11-08 VITALS — BP 124/64 | HR 56 | Ht 61.0 in | Wt 248.8 lb

## 2024-11-08 DIAGNOSIS — I4819 Other persistent atrial fibrillation: Secondary | ICD-10-CM | POA: Insufficient documentation

## 2024-11-08 DIAGNOSIS — I4891 Unspecified atrial fibrillation: Secondary | ICD-10-CM | POA: Diagnosis not present

## 2024-11-08 DIAGNOSIS — Z5181 Encounter for therapeutic drug level monitoring: Secondary | ICD-10-CM | POA: Diagnosis present

## 2024-11-08 DIAGNOSIS — Z79899 Other long term (current) drug therapy: Secondary | ICD-10-CM | POA: Diagnosis present

## 2024-11-08 DIAGNOSIS — D6869 Other thrombophilia: Secondary | ICD-10-CM | POA: Diagnosis not present

## 2024-11-08 NOTE — Progress Notes (Signed)
 "   Primary Care Physician: Dottie Norleen PHEBE PONCE, MD Primary Cardiologist: Dr Court Primary Electrophysiologist: Dr Cindie Referring Physician: [facility]   Shannon Griffin is a 79 y.o. female with a history of HLD, HTN, hypothyroid, OSA, and atrial fibrillation who presents for follow up in the [facility]. The patient was initially diagnosed with atrial fibrillation in 2012 and had a DCCV in 2015. Patient is on Eliquis  for stroke prevention. S/p DCCV on 12/23/23. Noted in serial cardiology visits 4/15 and 5/22 to have had ERAF (persistent). Started on diltiazem  120 mg daily. No missed doses of Eliquis . Patient notes to be tired. She is s/p dofetilide  loading 8/5-05/27/24. She converted chemically and did not require DCCV. Unfortunately, she continued to have frequent afib episodes and was transitioned to amiodarone  06/2024.  Patient returns for follow up for atrial fibrillation and amiodarone  monitoring. She remains in SR today and feels well. She denies any interim symptoms of afib. No bleeding issues on anticoagulation.   Today, she  denies symptoms of palpitations, chest pain, shortness of breath, orthopnea, PND, lower extremity edema, dizziness, presyncope, syncope, bleeding, or neurologic sequela. The patient is tolerating medications without difficulties and is otherwise without complaint today.    Atrial Fibrillation Risk Factors:  she does have symptoms or diagnosis of sleep apnea. she does not have a history of rheumatic fever. she does not have a history of alcohol use. The patient does have a history of early familial atrial fibrillation or other arrhythmias. Brother had afib.   Atrial Fibrillation Management history:  Previous antiarrhythmic drugs: dofetilide , amiodarone   Previous cardioversions: 2015, 12/23/23 Previous ablations: none Anticoagulation history: Eliquis    Past Medical History:  Diagnosis Date   Atrial fibrillation (HCC)  06/27/2011   f/by Dr. Court of [facility]   Colonoscopy refused    Hyperlipidemia    Hypothyroid    Mammogram declined    Morbid obesity (HCC)    Multiple thyroid  nodules    known to Endocrine with previous biopsies   OSA on CPAP 10/25/2012   Osteoarthritis    Osteopenia    Repeat Dexascan declined     Current Outpatient Medications  Medication Sig Dispense Refill   amiodarone  (PACERONE ) 200 MG tablet Take 1 tablet (200 mg total) by mouth daily. 90 tablet 2   ascorbic acid (VITAMIN C) 500 MG tablet Take 500 mg by mouth daily. Time release     Bacitracin-Polymyxin B (NEOSPORIN EX) Apply 1 Application topically as needed (skin folds).     bisoprolol  (ZEBETA ) 5 MG tablet Take 2.5mg  in the AM and 5mg  in the PM 45 tablet 3   diltiazem  (CARDIZEM ) 30 MG tablet TAKE ONE TABLET BY MOUTH EVERY 4 HOURS AS NEEDED FOR HEART RATE > 100 30 tablet 1   ELDERBERRY PO With Zinc- Taking 1 gummy by mouth daily     ELIQUIS  5 MG TABS tablet TAKE ONE TABLET BY MOUTH TWICE A DAY 180 tablet 1   furosemide  (LASIX ) 40 MG tablet Take 1 tablet (40 mg total) by mouth daily. 90 tablet 3   hydroxypropyl methylcellulose / hypromellose (ISOPTO TEARS / GONIOVISC) 2.5 % ophthalmic solution Place 1 drop into both eyes in the morning and at bedtime.     levothyroxine  (SYNTHROID ) 112 MCG tablet Take 1 tablet (112 mcg total) by mouth See admin instructions. Take 1 tablet (112 mcg) by mouth on Mondays to Saturdays in the morning & skip dose on Sundays. 90 tablet 0   Multiple  Vitamins-Minerals (MULTIVITAMIN WITH MINERALS) tablet Take 1 tablet by mouth daily after lunch.     vitamin E 180 MG (400 UNITS) capsule Take 400 Units by mouth daily.     Zinc Oxide (DESITIN EX) Apply 1 Application topically as needed (Irritation).     No current facility-administered medications for this encounter.    Allergies  Allergen Reactions   Atorvastatin  Other (See Comments)    Joint/muscle aches   Repatha  [Evolocumab ] Other (See  Comments)    Pain in the joints Head Aches Throat pain Pressure from the clavicle and up   Freeze It [Camphor-Menthol] Swelling    Histofreeze   Lidocaine Swelling   Tape     It melts the skin and causes sores   ROS- All systems are reviewed and negative except as per the HPI above.  Physical Exam: Vitals:   11/08/24 1117  BP: 124/64  Pulse: (!) 56  Weight: 112.9 kg  Height: 5' 1 (1.549 m)    GEN: Well nourished, well developed in no acute distress CARDIAC: Regular rate and rhythm, no murmurs, rubs, gallops RESPIRATORY:  Clear to auscultation without rales, wheezing or rhonchi  ABDOMEN: Soft, non-tender, non-distended EXTREMITIES:  No edema; No deformity    Wt Readings from Last 3 Encounters:  11/08/24 112.9 kg  09/26/24 112 kg  08/30/24 111.3 kg    EKG Interpretation Date/Time:  Tuesday November 08 2024 11:27:59 EST Ventricular Rate:  56 PR Interval:  156 QRS Duration:  74 QT Interval:  468 QTC Calculation: 451 R Axis:   62  Text Interpretation: Sinus bradycardia with sinus arrhythmia Otherwise normal ECG When compared with ECG of 25-Jul-2024 11:46, No significant change was found Confirmed by Castle Lamons ([id number]) on 11/08/2024 11:38:31 AM    Echo 04/20/24 demonstrated   1. Left ventricular ejection fraction, by estimation, is 60 to 65%. Left  ventricular ejection fraction by 3D volume is 61 %. The left ventricle has  normal function. The left ventricle has no regional wall motion  abnormalities. Left ventricular diastolic function could not be evaluated.   2. Right ventricular systolic function is normal. The right ventricular  size is normal. There is mildly elevated pulmonary artery systolic  pressure. The estimated right ventricular systolic pressure is 38.2 mmHg.   3. Left atrial size was mild to moderately dilated.   4. Right atrial size was moderately dilated.   5. The mitral valve is normal in structure. Mild mitral valve  regurgitation. No evidence of  mitral stenosis.   6. Tricuspid valve regurgitation is mild to moderate.   7. The aortic valve is normal in structure. Aortic valve regurgitation is  not visualized. No aortic stenosis is present.   8. Aortic dilatation noted. There is dilatation of the ascending aorta,  measuring 38 mm.   9. The inferior vena cava is dilated in size with >50% respiratory  variability, suggesting right atrial pressure of 8 mmHg.   Epic records are reviewed at length today   CHA2DS2-VASc Score = 4  The patient's score is based upon: CHF History: 0 HTN History: 1 Diabetes History: 0 Stroke History: 0 Vascular Disease History: 0 Age Score: 2 Gender Score: 1       ASSESSMENT AND PLAN: Persistent Atrial Fibrillation (ICD10:  I48.19) The patient's CHA2DS2-VASc score is 4, indicating a 4.8% annual risk of stroke.   Previously failed dofetilide , loaded on amiodarone .  Patient appears to be maintaining SR. Continue amiodarone  200 mg daily Continue bisoprolol  2.5 mg AM and 5  mg PM Continue Eliquis  5 mg BID Continue diltiazem  30 mg PRN q 4 hours for heart racing.   Secondary Hypercoagulable State (ICD10:  D68.69) The patient is at significant risk for stroke/thromboembolism based upon her CHA2DS2-VASc Score of 4.  Continue Apixaban  (Eliquis ). No bleeding issues.   High Risk Medication Monitoring (ICD 10: [id number]) Patient requires ongoing monitoring for anti-arrhythmic medication which has the potential to cause life threatening arrhythmias. Intervals on ECG acceptable for amiodarone  monitoring. Check cmet/TSH today.     Obesity Body mass index is 47.01 kg/m.  Encouraged lifestyle modification  OSA  Encouraged nightly CPAP  CAD No anginal symptoms   Patient moving to [place]  and will get established with cardiology there. AF clinic as needed.    Daril Kicks PA-C [facility] [address] [place], [place] [address] [phone] 11/08/2024 11:55 AM  "

## 2024-11-09 ENCOUNTER — Ambulatory Visit (HOSPITAL_COMMUNITY): Payer: Self-pay | Admitting: Physician Assistant

## 2024-11-09 LAB — COMPREHENSIVE METABOLIC PANEL WITH GFR
ALT: 15 IU/L (ref 0–32)
AST: 18 IU/L (ref 0–40)
Albumin: 3.9 g/dL (ref 3.8–4.8)
Alkaline Phosphatase: 81 IU/L (ref 49–135)
BUN/Creatinine Ratio: 12 (ref 12–28)
BUN: 13 mg/dL (ref 8–27)
Bilirubin Total: 0.5 mg/dL (ref 0.0–1.2)
CO2: 26 mmol/L (ref 20–29)
Calcium: 9.6 mg/dL (ref 8.7–10.3)
Chloride: 100 mmol/L (ref 96–106)
Creatinine, Ser: 1.11 mg/dL — ABNORMAL HIGH (ref 0.57–1.00)
Globulin, Total: 2.8 g/dL (ref 1.5–4.5)
Glucose: 95 mg/dL (ref 70–99)
Potassium: 4.5 mmol/L (ref 3.5–5.2)
Sodium: 141 mmol/L (ref 134–144)
Total Protein: 6.7 g/dL (ref 6.0–8.5)
eGFR: 51 mL/min/1.73 — ABNORMAL LOW

## 2024-11-09 LAB — TSH: TSH: 4.96 u[IU]/mL — ABNORMAL HIGH (ref 0.450–4.500)

## 2024-11-23 ENCOUNTER — Other Ambulatory Visit: Payer: Self-pay | Admitting: Cardiology

## 2024-12-12 ENCOUNTER — Ambulatory Visit (HOSPITAL_BASED_OUTPATIENT_CLINIC_OR_DEPARTMENT_OTHER): Admitting: Family Medicine
